# Patient Record
Sex: Female | Born: 1955 | Race: White | Hispanic: No | Marital: Single | Smoking: Former smoker
Health system: Southern US, Community
[De-identification: ages and names within clinical notes are randomized; demographics above are authoritative.]

## PROBLEM LIST (undated history)

## (undated) DIAGNOSIS — R202 Paresthesia of skin: Secondary | ICD-10-CM

## (undated) DIAGNOSIS — I10 Essential (primary) hypertension: Secondary | ICD-10-CM

## (undated) DIAGNOSIS — K219 Gastro-esophageal reflux disease without esophagitis: Secondary | ICD-10-CM

## (undated) DIAGNOSIS — F329 Major depressive disorder, single episode, unspecified: Secondary | ICD-10-CM

## (undated) DIAGNOSIS — E7800 Pure hypercholesterolemia, unspecified: Secondary | ICD-10-CM

## (undated) DIAGNOSIS — M1990 Unspecified osteoarthritis, unspecified site: Secondary | ICD-10-CM

## (undated) DIAGNOSIS — G43909 Migraine, unspecified, not intractable, without status migrainosus: Secondary | ICD-10-CM

## (undated) DIAGNOSIS — F419 Anxiety disorder, unspecified: Secondary | ICD-10-CM

## (undated) DIAGNOSIS — R200 Anesthesia of skin: Secondary | ICD-10-CM

## (undated) DIAGNOSIS — F32A Depression, unspecified: Secondary | ICD-10-CM

## (undated) DIAGNOSIS — G971 Other reaction to spinal and lumbar puncture: Secondary | ICD-10-CM

## (undated) DIAGNOSIS — M797 Fibromyalgia: Secondary | ICD-10-CM

## (undated) DIAGNOSIS — I639 Cerebral infarction, unspecified: Secondary | ICD-10-CM

## (undated) DIAGNOSIS — C50911 Malignant neoplasm of unspecified site of right female breast: Secondary | ICD-10-CM

## (undated) HISTORY — PX: BREAST RECONSTRUCTION: SHX9

## (undated) HISTORY — PX: JOINT REPLACEMENT: SHX530

## (undated) HISTORY — PX: BREAST LUMPECTOMY: SHX2

## (undated) HISTORY — PX: FOOT SURGERY: SHX648

---

## 1999-09-10 DIAGNOSIS — C50911 Malignant neoplasm of unspecified site of right female breast: Secondary | ICD-10-CM

## 1999-09-10 HISTORY — PX: DILATION AND CURETTAGE OF UTERUS: SHX78

## 1999-09-10 HISTORY — PX: BREAST LUMPECTOMY: SHX2

## 1999-09-10 HISTORY — DX: Malignant neoplasm of unspecified site of right female breast: C50.911

## 1999-09-10 HISTORY — PX: CHOLECYSTECTOMY: SHX55

## 2006-01-02 ENCOUNTER — Other Ambulatory Visit: Admission: RE | Admit: 2006-01-02 | Discharge: 2006-01-02 | Payer: Self-pay | Admitting: *Deleted

## 2006-01-10 ENCOUNTER — Ambulatory Visit: Payer: Self-pay | Admitting: Hematology & Oncology

## 2006-02-06 ENCOUNTER — Encounter: Admission: RE | Admit: 2006-02-06 | Discharge: 2006-02-06 | Payer: Self-pay | Admitting: Family Medicine

## 2006-05-07 ENCOUNTER — Encounter: Admission: RE | Admit: 2006-05-07 | Discharge: 2006-05-07 | Payer: Self-pay | Admitting: Family Medicine

## 2006-09-03 ENCOUNTER — Encounter: Admission: RE | Admit: 2006-09-03 | Discharge: 2006-09-03 | Payer: Self-pay | Admitting: Family Medicine

## 2007-01-05 ENCOUNTER — Encounter (INDEPENDENT_AMBULATORY_CARE_PROVIDER_SITE_OTHER): Payer: Self-pay | Admitting: Specialist

## 2007-01-05 ENCOUNTER — Ambulatory Visit (HOSPITAL_BASED_OUTPATIENT_CLINIC_OR_DEPARTMENT_OTHER): Admission: RE | Admit: 2007-01-05 | Discharge: 2007-01-05 | Payer: Self-pay | Admitting: Specialist

## 2007-04-16 ENCOUNTER — Encounter (HOSPITAL_BASED_OUTPATIENT_CLINIC_OR_DEPARTMENT_OTHER): Admission: RE | Admit: 2007-04-16 | Discharge: 2007-06-09 | Payer: Self-pay | Admitting: Internal Medicine

## 2007-04-27 ENCOUNTER — Ambulatory Visit (HOSPITAL_COMMUNITY): Admission: RE | Admit: 2007-04-27 | Discharge: 2007-04-27 | Payer: Self-pay | Admitting: Internal Medicine

## 2007-06-10 ENCOUNTER — Encounter (HOSPITAL_BASED_OUTPATIENT_CLINIC_OR_DEPARTMENT_OTHER): Admission: RE | Admit: 2007-06-10 | Discharge: 2007-07-08 | Payer: Self-pay | Admitting: Surgery

## 2007-07-09 ENCOUNTER — Encounter (HOSPITAL_BASED_OUTPATIENT_CLINIC_OR_DEPARTMENT_OTHER): Admission: RE | Admit: 2007-07-09 | Discharge: 2007-08-02 | Payer: Self-pay | Admitting: Surgery

## 2007-08-03 ENCOUNTER — Encounter (HOSPITAL_BASED_OUTPATIENT_CLINIC_OR_DEPARTMENT_OTHER): Admission: RE | Admit: 2007-08-03 | Discharge: 2007-09-09 | Payer: Self-pay | Admitting: Surgery

## 2007-09-14 ENCOUNTER — Encounter (HOSPITAL_BASED_OUTPATIENT_CLINIC_OR_DEPARTMENT_OTHER): Admission: RE | Admit: 2007-09-14 | Discharge: 2007-10-27 | Payer: Self-pay | Admitting: Surgery

## 2007-09-17 ENCOUNTER — Encounter: Admission: RE | Admit: 2007-09-17 | Discharge: 2007-09-17 | Payer: Self-pay | Admitting: Family Medicine

## 2007-10-13 ENCOUNTER — Ambulatory Visit: Payer: Self-pay | Admitting: Hematology & Oncology

## 2007-10-13 ENCOUNTER — Other Ambulatory Visit: Admission: RE | Admit: 2007-10-13 | Discharge: 2007-10-13 | Payer: Self-pay | Admitting: Obstetrics and Gynecology

## 2007-10-26 LAB — CHCC SMEAR

## 2007-10-26 LAB — CBC WITH DIFFERENTIAL/PLATELET
Basophils Absolute: 0 10*3/uL (ref 0.0–0.1)
NEUT%: 75.4 % (ref 39.6–76.8)
RBC: 4.59 10*6/uL (ref 3.70–5.32)
RDW: 12.6 % (ref 11.3–14.5)
WBC: 14.4 10*3/uL — ABNORMAL HIGH (ref 3.9–10.0)
lymph#: 2.5 10*3/uL (ref 0.9–3.3)

## 2011-01-22 NOTE — Assessment & Plan Note (Signed)
[facility]   NAME:  Julie Turner, Julie Turner                 ACCOUNT NO.:  [id number]   MEDICAL RECORD NO.:  [id number]      DATE OF BIRTH:  10-02-1955   PHYSICIAN:  Theresia Majors. Tanda Rockers, M.D. 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  In the interim she has  used moist-moist dressing and daily antiseptic soap washing by way of  shower.  There has been no excessive drainage, malodor, or fever.   OBJECTIVE:  Blood pressure is 136/95, respirations 18, pulse rate 87,  temperature is 98.3.  Inspection of the wound shows a persistent open ulcer on the lower inner  quadrant of the breast.  There is no malodor.  There is no frank  necrosis.  The wound depth is essentially unchanged.   ASSESSMENT:  Persistent wound related to delayed radiation injury of the  right breast.   PLAN:  We will continue moist-moist dressing, antiseptic soap washing,  and proceed with hyperbaric oxygen as soon as possible.  We have  explained this approach to the patient in terms that she seems to  understand.  She indicates that she will call her insurance company to  expedite her precertification.      Harold A. Tanda Rockers, M.D.  Electronically Signed     HAN/MEDQ  D:  05/04/2007  T:  05/05/2007  Job:  [id number]

## 2011-01-22 NOTE — Assessment & Plan Note (Signed)
[facility]   NAME:  Julie Turner, Julie Turner                 ACCOUNT NO.:  [id number]   MEDICAL RECORD NO.:  [id number]      DATE OF BIRTH:  [birth year]   PHYSICIAN:  Theresia Majors. Tanda Rockers, M.D. VISIT DATE:  10/26/2007                                   OFFICE VISIT   SUBJECTIVE:  Julie Turner is a 55 year old lady whom we are seeing for a  delayed radiation injury following external radiation to the right  breast.  She has completed 2 separate courses of hyperbaric oxygen.  She  returns for follow-up.  She failed to keep her final appointments with  HBO because of an upper respiratory illness.  There has been no  recurrent fever.  She reports that she has also added some ear drops to  her ears to prevent wax.  There has been no interim fever.  She has been  concurrently seen by OB/GYN and oncology with no evidence of recurrence  of her breast tumor.   OBJECTIVE:  Blood pressure is 140/87, respirations 18, pulse rate 105,  temperature is 98.2.  Inspection of the right breast shows that the wound is completely  resolved.  There is a dimpling consistent with the ulceration but the  wound is completely re-epithelialized with no evidence of active  infection.  Inspection of both external auditory canals shows that the  tympanostomy tubes are in place and the eardrums appear to be normal.   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   PLAN:  We are discharging the patient from active follow-up in the Wound  Center.  We have encouraged her to keep her appointments with the  otolaryngologist for the final disposition regarding her tympanotomies.  We have given an opportunity to ask questions.  She expresses gratitude  for having been seen in the clinic and indicates that she will be  compliant.      Harold A. Tanda Rockers, M.D.  Electronically Signed     HAN/MEDQ  D:  10/26/2007  T:  10/27/2007  Job:  [id number]   cc:   Earvin Hansen L. Shon Hough, M.D.

## 2011-01-22 NOTE — Assessment & Plan Note (Signed)
[facility]   NAME:  Julie Turner, Julie Turner                 ACCOUNT NO.:  [id number]   MEDICAL RECORD NO.:  [id number]      DATE OF BIRTH:  November 25, 1955   PHYSICIAN:  Theresia Majors. Tanda Rockers, M.D. VISIT DATE:  07/07/2007                                   OFFICE VISIT   SUBJECTIVE:  Ms. Doland is a 55 year old lady with a delayed radiation  injury to her right breast.  She is undergoing hyperbaric oxygen  treatment.  There has been no interim drainage, excessive pain or  malodor.  There have been no symptoms referable to barotrauma, oxygen  toxicity, or claustrophobia.  The patient has completed #37 of a total  of 60 ordered dives.   OBJECTIVE:  Blood pressure is 137/83, respirations 18, pulse rate 66,  temperature is 97.9. Inspection of the wound shows that there has been  continued contraction.  There is no evidence of excessive drainage. A  wound curette was used to debride nonviable adipose tissue and skin.  Hemorrhage was controlled with direct pressure.   ASSESSMENT:  Clinical response to hyperbaric oxygen treatment of  a  delayed cutaneous injury secondary to radiation.   PLAN:  We will continue radiation therapy and evaluate her after five  additional dives.      Harold A. Tanda Rockers, M.D.  Electronically Signed     HAN/MEDQ  D:  07/07/2007  T:  07/08/2007  Job:  [id number]

## 2011-01-22 NOTE — Assessment & Plan Note (Signed)
[facility]   NAME:  CASIDEE, JANN                 ACCOUNT NO.:  [id number]   MEDICAL RECORD NO.:  [id number]      DATE OF BIRTH:  1956/02/05   PHYSICIAN:  Theresia Majors. Tanda Rockers, M.D. VISIT DATE:  05/26/2007                                   OFFICE VISIT   SUBJECTIVE:  Ms. Killam is a 55 year old lady who has undergoing  hyperbaric oxygen treatment for a delayed radiation injury to the right  breast.  She has completed #8 of a total ordered 30 treatments.  She  reports that there has been decreased drainage and decreased pain.   OBJECTIVE:  Blood pressure is 138/76, pulse rate of 70, respirations 16,  temperature 98.5.  Inspection of the wound shows that there has been development of healthy-  appearing granulation.  There is no necrosis whatsoever.  There is a  serous drainage, which is scant in volume.  There is no evidence of  inflammation or malodor.   ASSESSMENT:  Clinical response to hyperbaric oxygen.   PLAN:  We will continue her HBO therapy and a loose saline moist gauze  packing.  We will reevaluate her wound in 1 week.      Harold A. Tanda Rockers, M.D.  Electronically Signed     HAN/MEDQ  D:  05/26/2007  T:  05/27/2007  Job:  [id number]

## 2011-01-22 NOTE — Assessment & Plan Note (Signed)
[facility]   NAME:  Julie Turner, Julie Turner                 ACCOUNT NO.:  [id number]   MEDICAL RECORD NO.:  [id number]      DATE OF BIRTH:  27-Mar-1956   PHYSICIAN:  Theresia Majors. Tanda Rockers, M.D.      VISIT DATE:                                   OFFICE VISIT   SUBJECTIVE:  Julie Turner is a 55 year old lady who is undergoing  hyperbaric oxygen therapy for management of a delayed radiation injury  to her right breast.  She has completed 22 of 30 HBO treatments.  There  is been no excessive drainage, malodor, pain or fever.   OBJECTIVE:  Inspection of the wound shows that the area of ulceration  has contracted.  There is significant decrease in depth of the previous  cavity.  There appears to be increased vascularity with an increase in  the area of granulation throughout the wound.  There is no excessive  necrosis; a debridement is not needed.  On probe with a Q-tip, there are  no loculations.   ASSESSMENT:  Clinical response to hyperbaric oxygen treatment.   PLAN:  We will continue HBO treatment.  We have instructed the patient  to shower daily with antiseptic soap and blot the wound clean and wear a  dry dressing.  We are discontinuing the use of the hydrogel-impregnated  gauze.  The patient seems to understand these instructions and indicates  that she will be compliant.  We will reevaluate her in 5 days.      Harold A. Tanda Rockers, M.D.  Electronically Signed     HAN/MEDQ  D:  06/16/2007  T:  06/17/2007  Job:  [id number]

## 2011-01-22 NOTE — Assessment & Plan Note (Signed)
[facility]   NAME:  KAMAIYAH, USELTON                 ACCOUNT NO.:  [id number]   MEDICAL RECORD NO.:  [id number]      DATE OF BIRTH:  1956-08-03   PHYSICIAN:  Theresia Majors. Tanda Rockers, M.D. VISIT DATE:  08/10/2007                                   OFFICE VISIT   SUBJECTIVE:  Ms. Mena a 55 year old female who is undergoing hyperbaric  oxygen treatment for an ulceration to a right breast post radiation  treatment.  There has been mild exudate but no extensive malodor,  erythema or pain.  She denies symptoms related to barotrauma, oxygen  toxicity, or confinement anxiety.  She is complaining of the inability  to read in the distance.   OBJECTIVE:  VITAL SIGNS:  Stable.  She is afebrile.  HEENT:  Exam is clear.  NECK:  Supple.  Trachea is midline.  CHEST:  Inspection of the right breast shows that there is clear  granulation with a contracting wound.  There is no evidence of  infection.   ASSESSMENT:  Satisfactory response to hyperbaric oxygen for delayed  radiation injury to the right breast.   PLAN:  The patient is to have mammography performed within the next 2  weeks.  We will be interested in those results particularly as they  relate to the contralateral breast.  In addition, she has elected not to  proceed with hyperbaric oxygen treatment today due to her tardiness upon  arrival, but we will resume HBO therapy tomorrow.  We will reapply the  wound vac and resume HBO as ordered.  We have given the patient  opportunity to ask questions.  She seems to understand.  We have  encouraged her to be compliant.  She indicates that she we will be so.      Harold A. Tanda Rockers, M.D.  Electronically Signed     HAN/MEDQ  D:  08/10/2007  T:  08/10/2007  Job:  [id number]

## 2011-01-22 NOTE — Assessment & Plan Note (Signed)
[facility]   NAME:  Julie Turner, BLEAU                 ACCOUNT NO.:  [id number]   MEDICAL RECORD NO.:  [id number]      DATE OF BIRTH:  1956-07-04   PHYSICIAN:  Jonelle Sports. Sevier, M.D.  VISIT DATE:  07/22/2007                                   OFFICE VISIT   WOUND CENTER PROGRESS NOTE   HISTORY:  This 55 year old white female is being followed for a late  radiation soft tissue injury to the right breast following irradiation  therapy for breast cancer.  She has been managed with HBO therapy and  recently wound vac therapy was added as well.   She is seen today for a weekly wound evaluation in conjunction with the  HBO protocol.   She reports concern that there has been some low grade infection in this  wound from time to time and in fact she is currently on Cipro 250 mg  b.i.d. for the treatment of an Enterobacter cloacae infection  superficial in nature which was discovered recently.  She is very  concerned that as the wound heals that all the infection be out of their  so that she does not risk a recurrence.   PHYSICAL EXAMINATION:  Blood pressure 142/87, pulse 72, respirations 16,  temperature 98.2.  The wound at the three to four o'clock position just  the lateral to the areola on the right breast now measures 1.5 x 1.3 x  1.7 cm.  There is a suggestion of a tract at the depth of the wound at  the 12 to 1 o'clock position but this is less well delineated as a  separate factor than it was initially.  The previously seen fibrous  tissue in the wound base has pretty well disappeared and one can now see  beginning granulation.  There is no significant slough.  No malodor.  Notably the tissue in the wound itself is more pliable as is the tissue  of the adjacent breast.   IMPRESSION:  Radiation injury, soft tissue, of right breast improving on  HBO and wound vac therapy.   DISPOSITION:  The patient will return to her wound vac therapy on an  ongoing basis, this  together with the continuation of her hyperbaric  oxygen treatments.   She will continue the Cipro 250 mg p.o. b.i.d. for the present.   The next the wound evaluation will be one week hence, sooner if need be.           ______________________________  Jonelle Sports. Cheryll Cockayne, M.D.     RES/MEDQ  D:  07/22/2007  T:  07/23/2007  Job:  [id number]

## 2011-01-22 NOTE — Assessment & Plan Note (Signed)
[facility]   NAME:  CELENE, PIPPINS                 ACCOUNT NO.:  [id number]   MEDICAL RECORD NO.:  [id number]      DATE OF BIRTH:  August 20, 1956   PHYSICIAN:  Theresia Majors. Tanda Rockers, M.D.      VISIT DATE:                                   OFFICE VISIT   SUBJECTIVE:  Ms. Erb is a 55 year old female who has undergone a total  of 50 HBO treatments for a delayed radiation injury to the soft tissue  involving the right breast.  She has requested that she not proceed with  hyperbaric oxygen treatment today because she is having a bad day.  There has been no interim drainage.  She has taken only 3-4 Tylox  tablets over the last 2 weeks for pain.  There has been no excessive  malodor.  The wound VAC is initially discomforting when it is first  placed, but it becomes highly tolerable after being in place.   OBJECTIVE:  VITAL SIGNS:  Stable.  She is afebrile.  BREAST:  Inspection of the wound shows that there has been dramatic  decrease in area volume and depth.  There is healthy-appearing  granulation throughout the wound.  The undermining is markedly  decreased.  There is no drainage.  There is no malodor.  There is no  hyperemia.  On palpation, the breast is markedly less tender.   ASSESSMENT:  Clinical response to hyperbarics and wound VAC therapy.   PLAN:  1. We will continue her wound VAC as well as her hyperbaric oxygen.  2. We are suggesting that the patient speak with the site director      regarding her dissatisfaction with scheduling.  3. We will continue her HBO at 2.4 atmospheres 90 minutes 100% oxygen      with two 5-minute air breaks      Harold A. Tanda Rockers, M.D.  Electronically Signed     HAN/MEDQ  D:  07/28/2007  T:  07/28/2007  Job:  [id number]

## 2011-01-22 NOTE — Assessment & Plan Note (Signed)
[facility]   NAME:  Julie Turner, Julie Turner                 ACCOUNT NO.:  [id number]   MEDICAL RECORD NO.:  [id number]      DATE OF BIRTH:  04-Mar-1956   PHYSICIAN:  Theresia Majors. Tanda Rockers, M.D. VISIT DATE:  09/14/2007                                   OFFICE VISIT   SUBJECTIVE:  Julie Turner is a 55 year old lady who we are treating for a  soft tissue injury to the left breast following radiation therapy.  She  has currently completed 60 HBO treatments and has a residual wound which  has shown dramatic improvement, but it is nevertheless persistent.  We  have recommended that she continue her wound-vac therapy in the interim  as well as be precertified for hyperbaric oxygen, a total of 30  additional treatments.  She has also been instructed to proceed with her  annual mammogram.   In the interim, she has discontinued the wound-vac and she has not  proceeded with the serial mammogram.  There has been no interim  drainage, malodor, pain or fever.   OBJECTIVE:  VITAL SIGNS:  Blood pressure is 131/73, respirations 16,  pulse rate 94, temperature 97.1.  Inspection of the right breast shows that there has been continued  decrease in the area of the wound.  There is no necrosis.  There is no  drainage or malodor.  There is no hyperemia or cellulitis.   ASSESSMENT:  Continued improvement of the wound.   PLAN:  We are discontinuing her wound-vac.  We will resume her  hyperbaric oxygen treatment at 90-minute sessions, 100% oxygen at 2.4  atmospheres with two 5-minute air breaks.  The patient will continue to  perform daily antiseptic soap washes with moist-moist dressings to be  applied in the Wound Center.  We will reevaluate her in 1 week.      Harold A. Tanda Rockers, M.D.  Electronically Signed     HAN/MEDQ  D:  09/14/2007  T:  09/14/2007  Job:  [id number]

## 2011-01-22 NOTE — Consult Note (Signed)
NAME:  Julie Turner, Julie Turner                 ACCOUNT NO.:  [id number]   MEDICAL RECORD NO.:  [id number]          PATIENT TYPE:  REC   LOCATION:  FOOT                         FACILITY:  [facility]   PHYSICIAN:  Jonelle Sports. Sevier, M.D. DATE OF BIRTH:  [birth year]   DATE OF CONSULTATION:  06/02/2007  DATE OF DISCHARGE:                                 CONSULTATION   HISTORY:  This 55 year old white female is undergoing hyperbaric oxygen  therapy for a nonhealing abscess site in the right breast which occurred  several years after completion of radiation therapy, but the healing of  which has been greatly impaired by the radiation damage to that tissue.  She has to this point completed 13 hyperbaric oxygen treatments and says  that she can see a significant improvement in the overall appearance of  the wound.  She reports very little drainage, does notice some odor, and  is meticulous in her care of the lesion, irrigating it at least twice  daily with warm soapy water followed by saline rinses and then the  placement of the hydrogel-soaked gauze.   EXAMINATION:  Blood pressure today after hyperbaric therapy was 121/75,  pulse 88 and regular, respirations 16, temperature 98.1.   The wound itself today is uninflamed, perhaps slightly dry, and measures  2.4 x 2.0 on the surface and is approximately 2 cm deep with a sinus  tract of about 2.5 cm at the 12 o'clock position.  There is no odor, no  substantial drainage.   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   DISPOSITION:  The wound requires no debridement today.  It is redressed  with cotton gauze saturated with hydrogel and covered with an absorptive  SofSorb pad.   She will continue the same treatments at home and will continue on  hyperbaric oxygen therapy with her next such treatment tomorrow.           ______________________________  Jonelle Sports Cheryll Cockayne, M.D.     RES/MEDQ  D:  06/02/2007  T:  06/03/2007  Job:  [phone]

## 2011-01-22 NOTE — Assessment & Plan Note (Signed)
[facility]   NAME:  Julie Turner, Julie Turner                 ACCOUNT NO.:  [id number]   MEDICAL RECORD NO.:  [id number]      DATE OF BIRTH:  Jun 07, 1956   PHYSICIAN:  Theresia Majors. Tanda Rockers, M.D. VISIT DATE:  04/27/2007                                   OFFICE VISIT   SUBJECTIVE:  Julie Turner is a 55 year old female who has a delayed injury  associated with radiation therapy post lumpectomy.  The patient has been  previously evaluated by Dr. Leanord Hawking and has been referred for HBO  therapy.  In the interim, she continues to have to bathe the wound  daily.  She has been treated interimly also with a silver matrix  dressing.  There has been no excessive pain, malodor or drainage.   OBJECTIVE:  VITAL SIGNS:  Stable.  She is afebrile.  Inspection of the  breast shows that there are postsurgical changes in the left breast. In  the right breast at 4 o'clock is an oval wound which has no excessive  drainage or malodor.  There are remnants of what appears to be  granulation, which is poorly vascularized.  The wound was probed with a  Q-tip and extends down to the pectoral fascia.  There is no evidence of  abscess, cellulitis or axillary cervical adenopathy.   CLINICAL IMPRESSION:  Delayed radiation injury (myocutaneous)   PLAN:  We will recommend that we proceed with hyperbaric oxygen at 100%  oxygen at 2 atmospheres at 90-minute sessions and we are recommending  initially 30 treatments.   We have explained the indications of hyperbaric oxygen for complications  of radiation therapy  to the patient in terms that she seems to  understand.  We have specifically addressed the possibility of  barotrauma, claustrophobia and oxygen toxicity.  The patient seems to  understand.  She gives her verbal informed consent to proceed with  treatment protocol as outlined.  We will begin her HBO therapy as soon  as a dive slot becomes available on the schedule.  In the interim, we  will continue a moist  moist dressing to the wound.      Harold A. Tanda Rockers, M.D.  Electronically Signed     HAN/MEDQ  D:  04/27/2007  T:  04/27/2007  Job:  [id number]

## 2011-01-22 NOTE — Assessment & Plan Note (Signed)
[facility]   NAME:  Julie Turner, Julie Turner                 ACCOUNT NO.:  [id number]   MEDICAL RECORD NO.:  [id number]      DATE OF BIRTH:  [birth year]   PHYSICIAN:  Theresia Majors. Tanda Rockers, M.D.      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  In the interim she has  complained of some increased serous drainage.  There has been no  malodor.  There  has been no increased redness.  There has been no  interim trauma.  She has completed 27 out of 30 ordered dives.   OBJECTIVE:  Blood pressure is 111/69, respirations 16, pulse rate 86,  temperature 97.7.  Inspection of the right breast shows that the wound  continues to contract.  There is healthy-appearing granulation with  areas of fibrosis.  There is no necrosis.  There is no need for  debridement.  There is no increased induration.   ASSESSMENT:  Clinical response to HBO.   PLAN:  We will continue HBO treatments.  Reevaluate the wound after five  additional treatments.      Harold A. Tanda Rockers, M.D.  Electronically Signed     HAN/MEDQ  D:  06/23/2007  T:  06/24/2007  Job:  [id number]

## 2011-01-22 NOTE — Assessment & Plan Note (Signed)
[facility]   NAME:  LA, SHEHAN                 ACCOUNT NO.:  [id number]   MEDICAL RECORD NO.:  [id number]      DATE OF BIRTH:  1956/06/13   PHYSICIAN:  Theresia Majors. Tanda Rockers, M.D. VISIT DATE:  08/18/2007                                   OFFICE VISIT   SUBJECTIVE:  Ms. 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  We have treated her with  the Wound VAC, hyperbaric oxygen treatment and serial debridements.  She  returns for follow-up.  There is been no interim excessive drainage,  pain or malodor.   OBJECTIVE:  Blood pressure is 135/75, respirations are 16, pulse rate is  86, temperature is 99.  Inspection of the right breast shows continued contraction.  There is  healthy granulation throughout the wound.  The wound was sounded with a  Q-tip and the measurements were recorded.  Please refer to the data  entries.   ASSESSMENT:  Clinical improvement, continued improvement of the  radiation-injured breast as a result of hyperbaric oxygen therapy.   PLAN:  We are recommending that we continue her HBO treatment along with  the Wound VAC.  We have also strongly encouraged the patient to proceed  with her yearly mammogram with particular attention not only to the  breast that we are treating but also her contralateral breast.  We will  continue her HBO.   There have been no complaints of claustrophobia, oxygen toxicity or  barotrauma.  We have given the patient an opportunity to ask questions.  She seems to understand the treatment that has been proposed and  indicates that she will be compliant.      Harold A. Tanda Rockers, M.D.  Electronically Signed     HAN/MEDQ  D:  08/18/2007  T:  08/18/2007  Job:  [id number]   cc:   Sigmund Hazel, M.D.  Yaakov Guthrie. Shon Hough, M.D.

## 2011-01-22 NOTE — Assessment & Plan Note (Signed)
[facility]   NAME:  Julie Turner, Julie Turner                 ACCOUNT NO.:  [id number]   MEDICAL RECORD NO.:  [id number]      DATE OF BIRTH:  1956/08/22   PHYSICIAN:  Theresia Majors. Tanda Rockers, M.D. 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  In the interim, she has complained of some persistent pain, but  certainly much less than before the initiation of HBO.  There has been  no excessive drainage or malodor.   OBJECTIVE:  Blood pressure is 121/74, pulse rate of 70, respirations 16,  temperature is 98.0.  These vital signs were taken at the end of her  42nd dive.  Inspection of the wound shows that there is contraction of  the cavity.  There is necrosis in the depths of the wound, which was  debrided with a curette.  Hemorrhage was controlled with direct  pressure; the wound was copiously irrigated.  There is no evidence of  abscess formation or extensive infection.   ASSESSMENT:  Continued response to hyperbaric oxygen.   PLAN:  We will add a wound VAC to this wound in an attempt to close this  cavity.  We will continue hyperbaric oxygen treatment. In addition, we  will restart her Cipro at 250 mg p.o. b.i.d.; her last culture grew a  Pseudomonas that was sensitive. We have recultured her today.  We will  reevaluate her in 5 days.  Hopefully we will start her wound VAC within  the next 24 hours.      Harold A. Tanda Rockers, M.D.  Electronically Signed     HAN/MEDQ  D:  07/14/2007  T:  07/14/2007  Job:  [id number]

## 2011-01-22 NOTE — Assessment & Plan Note (Signed)
[facility]   NAME:  Julie Turner, Julie Turner                 ACCOUNT NO.:  [id number]   MEDICAL RECORD NO.:  [id number]      DATE OF BIRTH:  [birth year]   PHYSICIAN:  Theresia Majors. Tanda Rockers, M.D.      VISIT DATE:                                   OFFICE VISIT   Julie Turner is a 55 year old lady who we are treating for delayed  radiation injury to the soft tissue of the right breast.  In the interim  she has been instructed to take antiseptic soap baths twice a day and  maintain a moist dressing she continues h.b.o.  There has been no fever,  no excessive drainage or mal odor.   OBJECTIVE:  VITAL SIGNS:  Blood pressure:  124/76.  Respirations:  16.  Pulse rate: 87.  Temperature:  98.2  Inspection of the right breast shows that the ulcer continues to  contract.  There is 100 granulation with some moderate desquamation.  There is no evidence of infection.   ASSESSMENT:  Satisfactory response to hyperbaric oxygen.   PLAN:  We will continue hyperbaric oxygen and reevaluate her in one  week.      Harold A. Tanda Rockers, M.D.  Electronically Signed     HAN/MEDQ  D:  09/21/2007  T:  09/21/2007  Job:  [id number]

## 2011-01-22 NOTE — Assessment & Plan Note (Signed)
[facility]   NAME:  Julie Turner, Julie Turner NO.:  [id number]   MEDICAL RECORD NO.:  [id number]      DATE OF BIRTH:  Nov 01, 1955   PHYSICIAN:  Maxwell Caul, M.D. VISIT DATE:  04/20/2007                                   OFFICE VISIT   LOCATION:  [facility].   Julie Turner is a 55 year old woman who arrives for review of a wound on  her right breast.  The patient is her own informant.  She arrived  accompanied by her mother.   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  She underwent two  lumpectomies and axillary lymph node dissection.  She had two courses of  radiation and I believe chemotherapy at that time.  There were no  immediate wound problems.  In 2005 she went on to have left breast  reduction without incident to match her right breast.  Her most recent  problem seems to have started in January of this year when she developed  a hot, red erythematous area on the anterior aspect of her right breast.  The entire area then burst open.  She went to see Dr. Shon Hough at  that time.  She tells me that she had an infection.  She was treated  with antibiotics and for the next three months various topical dressings  to what was now an open wound.  Finally in April 2008 she went to the OR  for an attempt at surgical closure of this wound.  The operative report  on E-chart is reviewed.  The patient apparently had methylene blue  applied to what was a fistulous opening.  The patient tells me that  within two days the wound had dehisced.  Since then she has been  basically seen in Dr. Charlesetta Garibaldi office every 1-2 weeks.  Of particular  note the pathology done at the time of the operative report was negative  for malignancy.   PAST MEDICAL HISTORY:  Two C-sections, lower abdominal abscess dealt  with surgically two years ago.  At that point she had some form  of  allergic reaction to SilvaSorb; however, subsequently __________  was  tolerated.  She is not a diabetic; however, she does smoke.  She also  has fibromyalgia.   MEDICATIONS:  1. Triamterene/hydrochlorothiazide 25 daily.  2. Cymbalta 60 daily.  3. Fish oil daily.  4. Multivitamin daily.   PHYSICAL EXAMINATION:  Exam is limited to the right breast and  surrounding area.  At roughly four o'clock in the right breast is the  original open area.  This measures 1.8 x 2.8 x 2.8.  There is a sinus at  roughly one o'clock in this wound that probes quite deeply.  There was  some purulent-looking drainage coming out of the sinus.  The wound bed  has some purulent looking material; otherwise, appears to be healthy.  The breast itself has firm areas over the surrounding breast tissue.  However, there are no obvious lumps, and I think all of this is probably  post radiation damage.  She had no axillary lymphadenopathy; the scar  from her previous node  dissection is noted.  There was no other  surrounding adenopathy.  The breast itself did not appear to be  obviously infected.   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 She has  been referred here for consideration of hyperbaric oxygen, and I agree  that that would probably be helpful and is probably the next step in her  treatment.  The patient expresses a strong wish to proceed with this.  I  will ask for Dr. Charlesetta Garibaldi notes specific to cultures that were done  on this area.  I have done a culture today as well of the deep sinus  tract at 1 o'clock in this wound.  We have packed this area with Aquacel  AG  which the patient can leave in until she is next seen.  We will  proceed with hyperbaric oxygen workup including EKG, chest x-ray, chem-  24, and CBC.  I see no contraindications to this.   We will see the patient back in one weeks' time and  follow-up.           ______________________________  Maxwell Caul, M.D.     MGR/MEDQ  D:  04/20/2007  T:  04/21/2007  Job:  [id number]   cc:   Earvin Hansen L. Shon Hough, M.D.  Sigmund Hazel, M.D.

## 2011-01-22 NOTE — Consult Note (Signed)
NAME:  Julie Turner, Julie Turner                 ACCOUNT NO.:  [id number]   MEDICAL RECORD NO.:  [id number]          PATIENT TYPE:  REC   LOCATION:  FOOT                         FACILITY:  [facility]   PHYSICIAN:  Julie Sports. Sevier, M.D. DATE OF BIRTH:  Apr 16, 1956   DATE OF CONSULTATION:  06/30/2007  DATE OF DISCHARGE:                                 CONSULTATION   HISTORY:  This 55 year old white female with history of breast cancer is  being followed for a radiation-induced wound of the right breast.  She  is currently on hyperbaric therapy and has completed some 30 of those  treatments.  She is seen today for a weekly wound evaluation.  She  apparently was cultured here a week ago because of some increased  exudate in the wound and that was found to show Enterobacter cloacae and  Pseudomonas aeruginosa, widely sensitive, and she was accordingly begun  on Cipro which she started 4 days ago.  She reports no increase in  drainage, no increase in pain, no malodor, and no other change.  She  treats a wound with occasional application of hydrogel and a dry  dressing.   PHYSICAL EXAMINATION:  VITAL SIGNS:  For vital signs today, please see  her HBO record.  Those were reviewed at that time and were satisfactory.  WOUND:  Her wound on the right breast, which is located at approximately  4 o'clock in relation to the nipple, now measures 2.1 x 1.7 cm x 1.5 cm  in depth, reaching a depth of about 1.7 cm at the apex of the wound in  the 11 to 1 o'clock position.  The wound is  aligned with tissue that is  not particularly granular and appears rather atrophic and shiny.  At  this point on the base of the wound is noted again a plaque of adherent  yellow fibrous tissue which does not appear to me to be an infectious  exudate as much as it is just a fibrous band.   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   DISPOSITION:  1. No debridement is done today.  It seems to me that  probably  at      some point, hopefully when the surrounding tissues begin to look a      little more vascular and a little more healthy,  she will need      debridement of this fibrous band.   Meanwhile, she will continue periodic applications of wound gel, with [place]  having provide such an application today and continue to dress it with a  dry dressing.  She will complete her prescribed course of hyperbaric  oxygen therapy.   The patient seems a little despondent today with respect to the slowness  of her progress but once she ventilates, seems much better and obviously  seems to understand that aspect of the situation.   Again, she will continue hyperbaric oxygen therapy with her next  treatment 1 day following this evaluation and she will have another  wound evaluation and 1 week.  ______________________________  Julie Turner, M.D.     RES/MEDQ  D:  06/30/2007  T:  07/01/2007  Job:  [id number]

## 2011-01-25 NOTE — Op Note (Signed)
NAME:  Julie Turner, Julie Turner                 ACCOUNT NO.:  [id number]   MEDICAL RECORD NO.:  [id number]          PATIENT TYPE:  AMB   LOCATION:  DSC                          FACILITY:  [facility]   PHYSICIAN:  Earvin Hansen L. Truesdale, M.D.DATE OF BIRTH:  [birth year]   DATE OF PROCEDURE:  01/05/2007  DATE OF DISCHARGE:                               OPERATIVE REPORT   This 55 year old lady is status post right lumpectomy for breast cancer  with radiation and chemotherapy.  The patient, after that surgery, had a  wound breakdown causing an increased drainage that was excised and  closed.  This was approximately 2 years ago in [place].  She now has  over the last few weeks, an open wound on the right breast at  approximately 4 o'clock position on the areola.  We have done extensive  conservative treatment including debridement using enzymatic debriders,  etc., with no avail in terms of the area healing.  The patient is now  being prepared for exploration of the area, plastic closure.   ANESTHESIA:  General.   The patient underwent general anesthesia, was intubated orally.  Prep  was done to the breast areas with Hibiclens soap and solution and walled  off with sterile towels and drapes so as to make a sterile field.  Doppler which had a fistulous clinical picture and was injected loosely  with methylene blue drops.  Methylene blue was dropped down into this  area and allowed to sit for 5 minutes.  After this, I used a marking  pen.  I diagrammed an elliptical excision, including some of the peri-  areolar area to camouflage some of the scar.  I dissected around this  with #15 blade, then used a Bovie.  We dissected around the area at  approximately 1-cm margin to all her breast tissue.  Then we went under  the area and was able to remove the whole area in toto without showing  any methylene blue perforation.  Hemostasis was maintained with a Bovie  and anticoagulation.  Next, what I did was I grabbed and  dissected some  of the deep flaps of breast tissue at the lateral, medial, superior, and  inferior margins, freeing them up approximately 3 cm and then sliding  everything together in the midline area of deep subcutaneous sutures of  2-0 Monocryl x3 layers, then a subdermal suture of 3-0 Monocryl, and  then I ran a subcuticular stitch of 3-0 Monocryl to disallow  saucerization or inversion of the wound.  This came together very nicely  without any problems.  The specimen was then examined.  I sliced it in  half.  At the base of the tract, it looked as if there was a possible  suture reaction, but we have sent it pathology for the official reading.  The wounds were cleansed.  Steri-Strips and soft dressing was applied to  all areas.  She withstood the procedure very well, was taken to recovery  in excellent condition.      Yaakov Guthrie. Shon Hough, M.D.  Electronically Signed     GLT/MEDQ  D:  01/05/2007  T:  01/05/2007  Job:  [id number]

## 2011-06-21 LAB — COMPREHENSIVE METABOLIC PANEL
ALT: 21
Albumin: 4.3
Alkaline Phosphatase: 65
BUN: 14
CO2: 24
Calcium: 9.9
Chloride: 105
Creatinine, Ser: 0.88
GFR calc Af Amer: >60
Total Bilirubin: 0.7
Total Protein: 7.5

## 2011-06-21 LAB — DIFFERENTIAL
Basophils Relative: 0
Eosinophils Absolute: 0.1
Eosinophils Relative: 1
Lymphocytes Relative: 24
Monocytes Relative: 6
Neutro Abs: 9.7 — ABNORMAL HIGH

## 2011-06-21 LAB — CBC
HCT: 44
Hemoglobin: 15.1 — ABNORMAL HIGH
MCV: 90.3
Platelets: 320
RDW: 12.9

## 2011-11-21 ENCOUNTER — Other Ambulatory Visit: Payer: Self-pay | Admitting: Family Medicine

## 2011-11-21 ENCOUNTER — Other Ambulatory Visit (HOSPITAL_COMMUNITY)
Admission: RE | Admit: 2011-11-21 | Discharge: 2011-11-21 | Disposition: A | Discharge status: Home / Self Care | Payer: Medicaid Other | Source: Ambulatory Visit | Attending: Family Medicine | Admitting: Family Medicine

## 2011-11-21 DIAGNOSIS — Z01419 Encounter for gynecological examination (general) (routine) without abnormal findings: Secondary | ICD-10-CM | POA: Insufficient documentation

## 2011-12-17 ENCOUNTER — Ambulatory Visit: Payer: Self-pay | Admitting: Physical Therapy

## 2013-02-18 ENCOUNTER — Telehealth: Payer: Self-pay | Admitting: Hematology & Oncology

## 2013-02-18 NOTE — Telephone Encounter (Signed)
Pt aware of 7-17 appointment, she didn't want fridays or before 1pm

## 2013-03-25 ENCOUNTER — Ambulatory Visit (HOSPITAL_BASED_OUTPATIENT_CLINIC_OR_DEPARTMENT_OTHER): Payer: Medicaid Other | Admitting: Hematology & Oncology

## 2013-03-25 ENCOUNTER — Ambulatory Visit: Payer: Medicaid Other

## 2013-03-25 ENCOUNTER — Other Ambulatory Visit (HOSPITAL_BASED_OUTPATIENT_CLINIC_OR_DEPARTMENT_OTHER): Payer: Medicaid Other | Admitting: Lab

## 2013-03-25 VITALS — BP 132/71 | HR 93 | Temp 98.0°F | Resp 16 | Ht 65.0 in | Wt 205.0 lb

## 2013-03-25 DIAGNOSIS — D72829 Elevated white blood cell count, unspecified: Secondary | ICD-10-CM

## 2013-03-25 LAB — CBC WITH DIFFERENTIAL (CANCER CENTER ONLY)
Eosinophils Absolute: 0.2 10*3/uL (ref 0.0–0.5)
LYMPH#: 3.9 10*3/uL — ABNORMAL HIGH (ref 0.9–3.3)
MCH: 31.4 pg (ref 26.0–34.0)
MONO#: 0.9 10*3/uL (ref 0.1–0.9)
Platelets: 258 10*3/uL (ref 145–400)
RBC: 4.75 10*6/uL (ref 3.70–5.32)
RDW: 12.7 % (ref 11.1–15.7)

## 2013-03-25 LAB — CHCC SATELLITE - SMEAR

## 2013-03-25 NOTE — Progress Notes (Signed)
This office note has been dictated.

## 2013-03-29 NOTE — Progress Notes (Signed)
CC:   Gretel Acre, MD, Fax [phone]  DIAGNOSIS:  Leukocytosis, likely reactive.  HISTORY OF PRESENT ILLNESS:  Ms. Kuan is a very nice 57 year old white female.  She has a lot of health issues.  She is on a lot of medications.  She has fibromyalgia.  She feels tired all the time.  She, again, is on a lot of medications.  She has hypertension.  She has anxiety.  She has some chronic pain issues with her back.  She does have a history of localized cancer of the right breast.  This was diagnosed back in 2000.  I am not sure exactly what the stage was, but some of this may have been stage of 1, as she says that she did not have cancer in the lymph nodes.  She was tried on tamoxifen after radiation therapy.  She did not tolerate this well.  She still feel that radiation is causing a lot of her issues.  She just feels tired.  She was diagnosed with fibromyalgia soon after the radiation was completed.  She has been seeing Dr. Janeice Robinson.  Of note, he has been doing some lab work on her.  Going back to March of 2013, her CBC showed a white count of 11.9, hemoglobin 15.2, hematocrit 44.5, platelet count is 261.  In December, her white cell count was 13.6.  Most recently, in June 2014, her CBC showed a white count of 15.5, hemoglobin 15.9, hematocrit 40, and platelet count was 303.  MCV was 93.  She had a normal white cell differential.  Because of the gradually increasing white cell count, Dr. Janeice Robinson felt that a hematologic evaluation was indicated.  Again, Ms. Scheib just does not feel well.  She says she feels tired. She hurts a lot.  There has been no weight loss or weight gain.  She is trying to lose weight.  She wants to exercise, but she says she just cannot because of the achiness and fatigue.  I told her that I thought that some of her problems may have been from all her medications that she is taking and medication interactions.  I told her that she probably needs to talk to  Dr. Janeice Robinson about seeing which ones could be held or discontinued.  She has had no change in bowel or bladder habits.  She has had no nausea or vomiting.  She still smokes a little bit.  She has had no rashes.  She denies any dysphagia or odynophagia.  Overall, her performance status is ECOG 1.  PAST MEDICAL HISTORY:  Detailed in the medical record.  She has hypertension, fibromyalgia, anxiety, chronic low back pain, hyperlipidemia, anxiety, and early stage breast cancer of the right breast.  ALLERGIES: 1. Arimidex. 2. Tamoxifen.  MEDICATIONS:  Ultram 50 mg p.o. q.6 hours, Cymbalta 120 mg p.o. q. day, Claritin 10 mg p.o. q. day, Xanax 1 mg p.o. t.i.d. p.r.n., hydrochlorothiazide 12.5 mg p.o. q. day, propranolol 80 mg p.o. q. day, Zanaflex 4 mg p.o. q.6 hours p.r.n., Flonase nasal spray 2 sprays q. day, Bentyl 20 mg p.o. t.i.d., Prilosec 20 mg p.o. q. day, aspirin 81 mg p.o. q. day, Ambien 10 mg p.o. q.h.s., vitamin D 50,000 units p.o. q. week, Savella 50 mg p.o. b.i.d.  SOCIAL HISTORY:  Remarkable for past tobacco use.  She probably smokes less than a half pack per day, but she used to smoke up to 2 packs per day.  There is no alcohol use.  She has no  obvious occupational exposures.  FAMILY HISTORY:  Remarkable for breast cancer in her mother who was 93 years old.  REVIEW OF SYSTEMS:  As stated in the history of present illness.  No additional findings were noted on a 12-system review.  PHYSICAL EXAMINATION:  General:  This is a fairly well-developed, well- nourished white female in no obvious distress.  Vital signs: Temperature of 98, pulse 93, respiratory rate 16, blood pressure 132/71. Weight is 205.  Head and neck:  Normocephalic, atraumatic skull.  There are no ocular or oral lesions.  There are no palpable cervical or supraclavicular lymph nodes.  Thyroid is not palpable.  There is no scleral icterus.  Lungs:  Clear bilaterally.  There are no rales, wheezes, or  rhonchi.  Cardiac:  Regular rate and rhythm, with a normal S1, S2.  There are no murmurs, rubs, or bruits.  Abdomen:  Soft with good bowel sounds.  There is no palpable abdominal mass.  There may be some slight tenderness in the left upper quadrant to palpation.  No palpable splenomegaly is noted.  There is no palpable hepatomegaly. Axillary:  No bilateral axillary adenopathy.  Back:  Some tenderness over the spine.  There are some spasms in the paravertebral muscles. Extremities:  Some trace edema in her ankles.  She did have a surgical scar on the dorsum of her left foot.  She has good range of motion of her joints.  There is no joint swelling, warmth or erythema. Neurological:  No focal neurological deficits.  Skin:  No rashes, ecchymosis, or petechia.  OTHER LABORATORY STUDIES:  White blood cells count 12.4, hemoglobin 14.9, hematocrit 44.7, platelet count 258.  MCV is 74.  White cell differential shows 60 segs, 31 lymphs, 7 monos, 2 eosinophils.  Peripheral smear shows a normochromic, normocytic population of red blood cells.  There are no nucleated red blood cells.  I see no teardrop cells.  There are no target cells.  I see no rouleaux formation.  White cells are minimally increased in number.  She has a few hypersegmented polys.  I see no immature myeloid cells.  There are no blasts.  There are no atypical lymphocytes.  Platelets are adequate in number and size.  IMPRESSION:  Ms. Newlon is a 57 year old white female with mild leukocytosis.  I actually did see Ms. Hixon back in 2009.  At that point in time, her white cell count was 14,000.  Her blood smear at that point in time was unremarkable.  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  She has multiple medical problems.  She is on multiple medications.  I just think that her bone marrow is somewhat stressed by all of her issues and by all of her medications.  Just to be on the safe side and to be thorough,  I am sending off her blood for JAK2 assay and also BCR-ABL assay.  I want to make sure that there is no underlying myeloproliferative process.  If any of these 2 tests come back positive, then she will need to have a bone marrow test done.  If these tests are negative, I really do not see that we need to get her back to the office, as her white cell count literally has not changed or has worsened in 5 years.  I told her to try some vitamin B12.  This may make her feel a little bit better.  I do not have any other suggestions for Ms. Nierman regarding trying to  get her to feel better.  Again, I think if she got off some of these medications that might help her, but this needs to be discussed with her internist.  I spent a good hour with Ms. Dosanjh.  I answered all her questions.  I tried to reassure her that I just did not think that we would find anything that was malignant or that would need to be treated or need to be investigated invasively.  I will call Ms. Centrella when I have the results back from the JAK2 assay and the BCR-ABL study.    ______________________________ Josph Macho, M.D. PRE/MEDQ  D:  03/25/2013  T:  03/26/2013  Job:  [id number]

## 2013-04-01 ENCOUNTER — Encounter: Payer: Self-pay | Admitting: Hematology & Oncology

## 2013-04-05 ENCOUNTER — Encounter: Payer: Self-pay | Admitting: Hematology & Oncology

## 2013-04-14 ENCOUNTER — Telehealth: Payer: Self-pay | Admitting: *Deleted

## 2013-04-14 NOTE — Telephone Encounter (Signed)
Left message on vm stating all of her lab results came back showing no obvious signs of leukemia. She most likely has an elevated WBC count due to her other chronic conditions per Dr Myna Hidalgo. This can be monitored by her PCP. To call back if further questions.

## 2014-02-15 ENCOUNTER — Other Ambulatory Visit (HOSPITAL_COMMUNITY)
Admission: RE | Admit: 2014-02-15 | Discharge: 2014-02-15 | Disposition: A | Discharge status: Home / Self Care | Payer: Medicaid Other | Source: Ambulatory Visit | Attending: Family Medicine | Admitting: Family Medicine

## 2014-02-15 ENCOUNTER — Other Ambulatory Visit: Payer: Self-pay | Admitting: Family Medicine

## 2014-02-15 DIAGNOSIS — Z124 Encounter for screening for malignant neoplasm of cervix: Secondary | ICD-10-CM | POA: Insufficient documentation

## 2014-02-15 DIAGNOSIS — Z1151 Encounter for screening for human papillomavirus (HPV): Secondary | ICD-10-CM | POA: Insufficient documentation

## 2014-02-17 LAB — CYTOLOGY - PAP

## 2014-03-01 ENCOUNTER — Other Ambulatory Visit: Payer: Self-pay | Admitting: Family Medicine

## 2014-03-01 ENCOUNTER — Ambulatory Visit
Admission: RE | Admit: 2014-03-01 | Discharge: 2014-03-01 | Disposition: A | Discharge status: Home / Self Care | Payer: Medicaid Other | Source: Ambulatory Visit | Attending: Family Medicine | Admitting: Family Medicine

## 2014-03-01 DIAGNOSIS — M25551 Pain in right hip: Secondary | ICD-10-CM

## 2014-06-23 ENCOUNTER — Other Ambulatory Visit: Payer: Self-pay | Admitting: Orthopedic Surgery

## 2014-07-05 ENCOUNTER — Inpatient Hospital Stay (HOSPITAL_COMMUNITY): Admission: RE | Admit: 2014-07-05 | Payer: Medicaid Other | Source: Ambulatory Visit

## 2014-07-13 ENCOUNTER — Encounter (HOSPITAL_COMMUNITY): Admission: RE | Payer: Self-pay | Source: Ambulatory Visit

## 2014-07-13 ENCOUNTER — Inpatient Hospital Stay (HOSPITAL_COMMUNITY): Admission: RE | Admit: 2014-07-13 | Payer: Medicaid Other | Source: Ambulatory Visit | Admitting: Orthopedic Surgery

## 2014-07-13 SURGERY — ARTHROPLASTY, HIP, TOTAL,POSTERIOR APPROACH
Anesthesia: Choice | Site: Hip | Laterality: Right

## 2014-08-08 NOTE — Pre-Procedure Instructions (Signed)
Jordon Bourquin  08/08/2014   Your procedure is scheduled on:  Monday, December 7th  Report to [facility] Admitting at 10 AM.  Call this number if you have problems the morning of surgery: [phone]   Remember:   Do not eat food or drink liquids after midnight.   Take these medicines the morning of surgery with A SIP OF WATER: cymbalta, prilosec, propranolol  Stop taking aspirin, OTC vitamins/herbal medications, NSAIDS (ibuprofen, advil, motrin) 7 days prior to surgery.   Do not wear jewelry, make-up or nail polish.  Do not wear lotions, powders, or perfumes,deodorant.  Do not shave 48 hours prior to surgery. Men may shave face and neck.  Do not bring valuables to the hospital.  [facility] is not responsible for any belongings or valuables.               Contacts, dentures or bridgework may not be worn into surgery.  Leave suitcase in the car. After surgery it may be brought to your room.  For patients admitted to the hospital, discharge time is determined by your treatment team.            Please read over the following fact sheets that you were given: Pain Booklet, Coughing and Deep Breathing, Blood Transfusion Information, MRSA Information and Surgical Site Infection Prevention  [facility] - Preparing for Surgery  Before surgery, you can play an important role.  Because skin is not sterile, your skin needs to be as free of germs as possible.  You can reduce the number of germs on you skin by washing with CHG (chlorahexidine gluconate) soap before surgery.  CHG is an antiseptic cleaner which kills germs and bonds with the skin to continue killing germs even after washing.  Please DO NOT use if you have an allergy to CHG or antibacterial soaps.  If your skin becomes reddened/irritated stop using the CHG and inform your nurse when you arrive at Short Stay.  Do not shave (including legs and underarms) for at least 48 hours prior to the first CHG shower.  You may shave your  face.  Please follow these instructions carefully:   1.  Shower with CHG Soap the night before surgery and the morning of Surgery.  2.  If you choose to wash your hair, wash your hair first as usual with your normal shampoo.  3.  After you shampoo, rinse your hair and body thoroughly to remove the shampoo.  4.  Use CHG as you would any other liquid soap.  You can apply CHG directly to the skin and wash gently with scrungie or a clean washcloth.  5.  Apply the CHG Soap to your body ONLY FROM THE NECK DOWN.  Do not use on open wounds or open sores.  Avoid contact with your eyes, ears, mouth and genitals (private parts).  Wash genitals (private parts) with your normal soap.  6.  Wash thoroughly, paying special attention to the area where your surgery will be performed.  7.  Thoroughly rinse your body with warm water from the neck down.  8.  DO NOT shower/wash with your normal soap after using and rinsing off the CHG Soap.  9.  Pat yourself dry with a clean towel.            10.  Wear clean pajamas.            11.  Place clean sheets on your bed the night of your first shower  and do not sleep with pets.  Day of Surgery  Do not apply any lotions/deoderants the morning of surgery.  Please wear clean clothes to the hospital/surgery center.

## 2014-08-09 ENCOUNTER — Encounter (HOSPITAL_COMMUNITY): Payer: Self-pay

## 2014-08-09 ENCOUNTER — Encounter (HOSPITAL_COMMUNITY)
Admission: RE | Admit: 2014-08-09 | Discharge: 2014-08-09 | Disposition: A | Discharge status: Home / Self Care | Payer: Medicaid Other | Source: Ambulatory Visit | Attending: Orthopedic Surgery | Admitting: Orthopedic Surgery

## 2014-08-09 HISTORY — DX: Essential (primary) hypertension: I10

## 2014-08-09 HISTORY — DX: Gastro-esophageal reflux disease without esophagitis: K21.9

## 2014-08-09 HISTORY — DX: Depression, unspecified: F32.A

## 2014-08-09 HISTORY — DX: Major depressive disorder, single episode, unspecified: F32.9

## 2014-08-09 HISTORY — DX: Anxiety disorder, unspecified: F41.9

## 2014-08-09 HISTORY — DX: Anesthesia of skin: R20.2

## 2014-08-09 HISTORY — DX: Unspecified osteoarthritis, unspecified site: M19.90

## 2014-08-09 HISTORY — DX: Paresthesia of skin: R20.0

## 2014-08-09 HISTORY — DX: Cerebral infarction, unspecified: I63.9

## 2014-08-09 HISTORY — DX: Other reaction to spinal and lumbar puncture: G97.1

## 2014-08-09 LAB — CBC WITH DIFFERENTIAL/PLATELET
BASOS ABS: 0 10*3/uL (ref 0.0–0.1)
Basophils Relative: 0 % (ref 0–1)
Eosinophils Absolute: 0.2 10*3/uL (ref 0.0–0.7)
Eosinophils Relative: 1 % (ref 0–5)
HCT: 42 % (ref 36.0–46.0)
HEMOGLOBIN: 13.7 g/dL (ref 12.0–15.0)
Lymphocytes Relative: 27 % (ref 12–46)
Lymphs Abs: 3.6 10*3/uL (ref 0.7–4.0)
MCH: 30.8 pg (ref 26.0–34.0)
MCHC: 32.6 g/dL (ref 30.0–36.0)
MCV: 94.4 fL (ref 78.0–100.0)
Monocytes Absolute: 0.9 10*3/uL (ref 0.1–1.0)
Monocytes Relative: 7 % (ref 3–12)
NEUTROS ABS: 8.6 10*3/uL — AB (ref 1.7–7.7)
NEUTROS PCT: 65 % (ref 43–77)
Platelets: 354 10*3/uL (ref 150–400)
RBC: 4.45 MIL/uL (ref 3.87–5.11)
RDW: 13.5 % (ref 11.5–15.5)
WBC: 13.3 10*3/uL — ABNORMAL HIGH (ref 4.0–10.5)

## 2014-08-09 LAB — APTT: APTT: 35 s (ref 24–37)

## 2014-08-09 LAB — BASIC METABOLIC PANEL
ANION GAP: 15 (ref 5–15)
BUN: 12 mg/dL (ref 6–23)
CALCIUM: 10.1 mg/dL (ref 8.4–10.5)
CHLORIDE: 99 meq/L (ref 96–112)
CO2: 26 mEq/L (ref 19–32)
Creatinine, Ser: 0.7 mg/dL (ref 0.50–1.10)
GFR calc non Af Amer: >90 mL/min (ref >90)
Glucose, Bld: 93 mg/dL (ref 70–99)
Potassium: 4.1 mEq/L (ref 3.7–5.3)
Sodium: 140 mEq/L (ref 137–147)

## 2014-08-09 LAB — SURGICAL PCR SCREEN
MRSA, PCR: NEGATIVE
STAPHYLOCOCCUS AUREUS: NEGATIVE

## 2014-08-09 LAB — PROTIME-INR
INR: 0.96 (ref 0.00–1.49)
PROTHROMBIN TIME: 12.9 s (ref 11.6–15.2)

## 2014-08-09 LAB — ABO/RH: ABO/RH(D): AB POS

## 2014-08-09 NOTE — Progress Notes (Signed)
Primary - dr. Orland Penman - [facility] No cardiologist No prior cardiac testing  [facility] routinely "up" has seen hemotologist for this in [place].

## 2014-08-10 LAB — URINE CULTURE: Colony Count: >=100000

## 2014-08-11 NOTE — H&P (Signed)
TOTAL HIP ADMISSION H&P  Patient is admitted for right total hip arthroplasty.  Subjective:  Chief Complaint: right hip pain  HPI: Julie Turner, 58 y.o. female, has a history of pain and functional disability in the right hip(s) due to arthritis and patient has failed non-surgical conservative treatments for greater than 12 weeks to include NSAID's and/or analgesics, corticosteriod injections, weight reduction as appropriate and activity modification.  Onset of symptoms was abrupt starting 2 years ago with gradually worsening course since that time.The patient noted no past surgery on the right hip(s).  Patient currently rates pain in the right hip at 10 out of 10 with activity. Patient has night pain, worsening of pain with activity and weight bearing, pain that interfers with activities of daily living, pain with passive range of motion, crepitus and joint swelling. Patient has evidence of joint space narrowing by imaging studies. This condition presents safety issues increasing the risk of falls.  There is no current active infection.  There are no active problems to display for this patient.  Past Medical History  Diagnosis Date  . Spinal headache   . Hypertension   . Stroke     "silent stroke" per her neurologist  . Anxiety   . Depression   . GERD (gastroesophageal reflux disease)   . Numbness and tingling in left hand   . Arthritis   . Cancer 2001    breast cancer    Past Surgical History  Procedure Laterality Date  . Cesarean section    . Breast surgery      2 lumpectomy and lymph nodes removed on right  . Breast reconstruction      on left  . Foot surgery Left     joint cleaned out    No prescriptions prior to admission   Allergies  Allergen Reactions  . Statins Other (See Comments)    Terrible heartburn  . Tape Other (See Comments)    Surgical tape/adhesive -- Pulled off skin  . Arimidex [Anastrozole]     Makes me feel crazy  . Tamoxifen Citrate     Makes me  feel crazy    History  Substance Use Topics  . Smoking status: Current Every Day Smoker -- 0.50 packs/day for 20 years    Types: Cigarettes  . Smokeless tobacco: Not on file  . Alcohol Use: Yes     Comment: rarely    No family history on file.   Review of Systems  Constitutional: Positive for malaise/fatigue and diaphoresis.  HENT: Positive for congestion and tinnitus.   Gastrointestinal: Positive for heartburn, nausea and diarrhea.  Musculoskeletal: Positive for myalgias and joint pain.  Neurological: Positive for headaches.  Psychiatric/Behavioral: The patient is nervous/anxious and has insomnia.     Objective:  Physical Exam  Constitutional: She is oriented to person, place, and time. She appears well-developed and well-nourished.  HENT:  Head: Normocephalic and atraumatic.  Eyes: Conjunctivae are normal. Pupils are equal, round, and reactive to light.  Neck: Normal range of motion. Neck supple.  Cardiovascular: Intact distal pulses.   Respiratory: Effort normal.  Musculoskeletal: She exhibits tenderness.  he patient does have intense pain with internal or external rotation of the right hip.  No pain with internal rotation of the left hip.  She continues to have tenderness in the groin.  She has brisk capillary refill and is neurovascularly intact distally.  Neurological: She is alert and oriented to person, place, and time.  Skin: Skin is warm and dry.  Psychiatric: She has a normal mood and affect. Her behavior is normal. Judgment and thought content normal.    Vital signs in last 24 hours:    Labs:   Estimated body mass index is 34.11 kg/(m^2) as calculated from the following:   Height as of 03/25/13: 5\' 5"  (1.651 m).   Weight as of 03/25/13: 92.987 kg (205 lb).   Imaging Review Plain radiographs demonstrate that the right hip does appear to have mild to moderate arthritis with joint space narrowing.  Assessment/Plan:  End stage arthritis, right hip(s)  The  patient history, physical examination, clinical judgement of the provider and imaging studies are consistent with end stage degenerative joint disease of the right hip(s) and total hip arthroplasty is deemed medically necessary. The treatment options including medical management, injection therapy, arthroscopy and arthroplasty were discussed at length. The risks and benefits of total hip arthroplasty were presented and reviewed. The risks due to aseptic loosening, infection, stiffness, dislocation/subluxation,  thromboembolic complications and other imponderables were discussed.  The patient acknowledged the explanation, agreed to proceed with the plan and consent was signed. Patient is being admitted for inpatient treatment for surgery, pain control, PT, OT, prophylactic antibiotics, VTE prophylaxis, progressive ambulation and ADL's and discharge planning.The patient is planning to be discharged home with home health services

## 2014-08-14 MED ORDER — CEFAZOLIN SODIUM-DEXTROSE 2-3 GM-% IV SOLR
2.0000 g | INTRAVENOUS | Status: AC
  Administered 2014-08-15: 2 g via INTRAVENOUS
  Filled 2014-08-14: qty 50

## 2014-08-14 NOTE — Anesthesia Preprocedure Evaluation (Addendum)
Anesthesia Evaluation  Patient identified by MRN, date of birth, ID band Patient awake    Reviewed: Allergy & Precautions, H&P , NPO status , Patient's Chart, lab work & pertinent test results, reviewed documented beta blocker date and time   History of Anesthesia Complications (+) POST - OP SPINAL HEADACHE  Airway Mallampati: III   Neck ROM: Limited    Dental  (+) Teeth Intact   Pulmonary Current Smoker (20 pack yr hx),  breath sounds clear to auscultation        Cardiovascular hypertension, Pt. on medications  EKG ok   Neuro/Psych  Headaches, Anxiety Depression    GI/Hepatic GERD-  ,  Endo/Other    Renal/GU      Musculoskeletal   Abdominal (+) + obese,   Peds  Hematology   Anesthesia Other Findings Small mouth opening  Reproductive/Obstetrics                           Anesthesia Physical Anesthesia Plan  ASA: III  Anesthesia Plan: General   Post-op Pain Management:    Induction: Intravenous  Airway Management Planned: Oral ETT  Additional Equipment:   Intra-op Plan:   Post-operative Plan: Extubation in OR  Informed Consent: I have reviewed the patients History and Physical, chart, labs and discussed the procedure including the risks, benefits and alternatives for the proposed anesthesia with the patient or authorized representative who has indicated his/her understanding and acceptance.     Plan Discussed with:   Anesthesia Plan Comments: (Tylenol, precedex?, Decadron, Lidocaine)        Anesthesia Quick Evaluation

## 2014-08-15 ENCOUNTER — Inpatient Hospital Stay (HOSPITAL_COMMUNITY): Payer: Medicaid Other | Admitting: Anesthesiology

## 2014-08-15 ENCOUNTER — Encounter (HOSPITAL_COMMUNITY): Payer: Self-pay | Admitting: Certified Registered Nurse Anesthetist

## 2014-08-15 ENCOUNTER — Encounter (HOSPITAL_COMMUNITY)
Admission: RE | Disposition: A | Discharge status: Home / Self Care | Payer: Self-pay | Source: Ambulatory Visit | Attending: Orthopedic Surgery

## 2014-08-15 ENCOUNTER — Inpatient Hospital Stay (HOSPITAL_COMMUNITY): Payer: Medicaid Other

## 2014-08-15 ENCOUNTER — Inpatient Hospital Stay (HOSPITAL_COMMUNITY)
Admission: RE | Admit: 2014-08-15 | Discharge: 2014-08-16 | DRG: 470 | Disposition: A | Discharge status: Home / Self Care | Payer: Medicaid Other | Source: Ambulatory Visit | Attending: Orthopedic Surgery | Admitting: Orthopedic Surgery

## 2014-08-15 DIAGNOSIS — F17210 Nicotine dependence, cigarettes, uncomplicated: Secondary | ICD-10-CM | POA: Diagnosis present

## 2014-08-15 DIAGNOSIS — I10 Essential (primary) hypertension: Secondary | ICD-10-CM | POA: Diagnosis present

## 2014-08-15 DIAGNOSIS — G43909 Migraine, unspecified, not intractable, without status migrainosus: Secondary | ICD-10-CM | POA: Diagnosis present

## 2014-08-15 DIAGNOSIS — Z853 Personal history of malignant neoplasm of breast: Secondary | ICD-10-CM

## 2014-08-15 DIAGNOSIS — F419 Anxiety disorder, unspecified: Secondary | ICD-10-CM | POA: Diagnosis present

## 2014-08-15 DIAGNOSIS — M25551 Pain in right hip: Secondary | ICD-10-CM | POA: Diagnosis present

## 2014-08-15 DIAGNOSIS — Z888 Allergy status to other drugs, medicaments and biological substances status: Secondary | ICD-10-CM | POA: Diagnosis not present

## 2014-08-15 DIAGNOSIS — E780 Pure hypercholesterolemia: Secondary | ICD-10-CM | POA: Diagnosis present

## 2014-08-15 DIAGNOSIS — Z96649 Presence of unspecified artificial hip joint: Secondary | ICD-10-CM

## 2014-08-15 DIAGNOSIS — F329 Major depressive disorder, single episode, unspecified: Secondary | ICD-10-CM | POA: Diagnosis present

## 2014-08-15 DIAGNOSIS — M797 Fibromyalgia: Secondary | ICD-10-CM | POA: Diagnosis present

## 2014-08-15 DIAGNOSIS — K219 Gastro-esophageal reflux disease without esophagitis: Secondary | ICD-10-CM | POA: Diagnosis present

## 2014-08-15 DIAGNOSIS — Z8673 Personal history of transient ischemic attack (TIA), and cerebral infarction without residual deficits: Secondary | ICD-10-CM

## 2014-08-15 DIAGNOSIS — M13851 Other specified arthritis, right hip: Secondary | ICD-10-CM | POA: Diagnosis present

## 2014-08-15 DIAGNOSIS — M1611 Unilateral primary osteoarthritis, right hip: Secondary | ICD-10-CM | POA: Diagnosis present

## 2014-08-15 HISTORY — DX: Migraine, unspecified, not intractable, without status migrainosus: G43.909

## 2014-08-15 HISTORY — PX: TOTAL HIP ARTHROPLASTY: SHX124

## 2014-08-15 HISTORY — DX: Malignant neoplasm of unspecified site of right female breast: C50.911

## 2014-08-15 HISTORY — DX: Pure hypercholesterolemia, unspecified: E78.00

## 2014-08-15 HISTORY — DX: Fibromyalgia: M79.7

## 2014-08-15 SURGERY — ARTHROPLASTY, HIP, TOTAL,POSTERIOR APPROACH
Anesthesia: General | Site: Hip | Laterality: Right

## 2014-08-15 MED ORDER — LACTATED RINGERS IV SOLN
INTRAVENOUS | Status: DC
  Administered 2014-08-15: 11:00:00 via INTRAVENOUS

## 2014-08-15 MED ORDER — MAGNESIUM CITRATE PO SOLN: 1.0000 | Freq: Once | ORAL | Status: AC | PRN

## 2014-08-15 MED ORDER — KCL IN DEXTROSE-NACL 40-5-0.45 MEQ/L-%-% IV SOLN
INTRAVENOUS | Status: DC
  Filled 2014-08-15 (×2): qty 1000

## 2014-08-15 MED ORDER — LORAZEPAM 1 MG PO TABS
1.0000 mg | ORAL_TABLET | Freq: Once | ORAL | Status: AC
  Administered 2014-08-15: 1 mg via ORAL
  Filled 2014-08-15: qty 1

## 2014-08-15 MED ORDER — PANTOPRAZOLE SODIUM 40 MG PO TBEC
40.0000 mg | DELAYED_RELEASE_TABLET | Freq: Every day | ORAL | Status: DC
  Administered 2014-08-16: 40 mg via ORAL
  Filled 2014-08-15: qty 1

## 2014-08-15 MED ORDER — LIDOCAINE HCL (CARDIAC) 20 MG/ML IV SOLN
INTRAVENOUS | Status: AC
  Filled 2014-08-15: qty 5

## 2014-08-15 MED ORDER — METHOCARBAMOL 500 MG PO TABS
ORAL_TABLET | ORAL | Status: AC
  Filled 2014-08-15: qty 1

## 2014-08-15 MED ORDER — METOCLOPRAMIDE HCL 5 MG/ML IJ SOLN: 5.0000 mg | Freq: Three times a day (TID) | INTRAMUSCULAR | Status: DC | PRN

## 2014-08-15 MED ORDER — FENTANYL CITRATE 0.05 MG/ML IJ SOLN
INTRAMUSCULAR | Status: AC
  Filled 2014-08-15: qty 5

## 2014-08-15 MED ORDER — FENTANYL CITRATE 0.05 MG/ML IJ SOLN
INTRAMUSCULAR | Status: AC
  Filled 2014-08-15: qty 2

## 2014-08-15 MED ORDER — MIDAZOLAM HCL 2 MG/2ML IJ SOLN
INTRAMUSCULAR | Status: AC
  Filled 2014-08-15: qty 2

## 2014-08-15 MED ORDER — WHITE PETROLATUM GEL
Status: AC
  Administered 2014-08-15: 21:00:00
  Filled 2014-08-15: qty 5

## 2014-08-15 MED ORDER — ONDANSETRON HCL 4 MG/2ML IJ SOLN
INTRAMUSCULAR | Status: AC
  Filled 2014-08-15: qty 2

## 2014-08-15 MED ORDER — ONDANSETRON HCL 4 MG PO TABS: 4.0000 mg | ORAL_TABLET | Freq: Four times a day (QID) | ORAL | Status: DC | PRN

## 2014-08-15 MED ORDER — SODIUM CHLORIDE 0.9 % IR SOLN
Status: DC | PRN
Start: 2014-08-15 — End: 2014-08-15
  Administered 2014-08-15: 1000 mL

## 2014-08-15 MED ORDER — KCL IN DEXTROSE-NACL 20-5-0.45 MEQ/L-%-% IV SOLN
INTRAVENOUS | Status: AC
  Filled 2014-08-15: qty 1000

## 2014-08-15 MED ORDER — BUPIVACAINE-EPINEPHRINE 0.5% -1:200000 IJ SOLN
INTRAMUSCULAR | Status: DC | PRN
  Administered 2014-08-15: 10 mL

## 2014-08-15 MED ORDER — CEFUROXIME SODIUM 1.5 G IJ SOLR
INTRAMUSCULAR | Status: AC
  Filled 2014-08-15: qty 1.5

## 2014-08-15 MED ORDER — PROPOFOL 10 MG/ML IV BOLUS
INTRAVENOUS | Status: AC
  Filled 2014-08-15: qty 20

## 2014-08-15 MED ORDER — TRANEXAMIC ACID 100 MG/ML IV SOLN
2000.0000 mg | INTRAVENOUS | Status: DC
  Filled 2014-08-15: qty 20

## 2014-08-15 MED ORDER — DULOXETINE HCL 60 MG PO CPEP
60.0000 mg | ORAL_CAPSULE | Freq: Every day | ORAL | Status: DC
  Administered 2014-08-16: 60 mg via ORAL
  Filled 2014-08-15: qty 1

## 2014-08-15 MED ORDER — BISACODYL 5 MG PO TBEC: 5.0000 mg | DELAYED_RELEASE_TABLET | Freq: Every day | ORAL | Status: DC | PRN

## 2014-08-15 MED ORDER — ROCURONIUM BROMIDE 50 MG/5ML IV SOLN
INTRAVENOUS | Status: AC
  Filled 2014-08-15: qty 1

## 2014-08-15 MED ORDER — ROCURONIUM BROMIDE 100 MG/10ML IV SOLN
INTRAVENOUS | Status: DC | PRN
  Administered 2014-08-15: 50 mg via INTRAVENOUS

## 2014-08-15 MED ORDER — LIDOCAINE HCL (CARDIAC) 20 MG/ML IV SOLN
INTRAVENOUS | Status: DC | PRN
  Administered 2014-08-15: 100 mg via INTRAVENOUS

## 2014-08-15 MED ORDER — METOCLOPRAMIDE HCL 10 MG PO TABS: 5.0000 mg | ORAL_TABLET | Freq: Three times a day (TID) | ORAL | Status: DC | PRN

## 2014-08-15 MED ORDER — EPHEDRINE SULFATE 50 MG/ML IJ SOLN
INTRAMUSCULAR | Status: AC
  Filled 2014-08-15: qty 1

## 2014-08-15 MED ORDER — ASPIRIN EC 325 MG PO TBEC: 325.0000 mg | DELAYED_RELEASE_TABLET | Freq: Two times a day (BID) | ORAL | Status: DC

## 2014-08-15 MED ORDER — HYDROCHLOROTHIAZIDE 25 MG PO TABS
12.5000 mg | ORAL_TABLET | Freq: Every day | ORAL | Status: DC
  Filled 2014-08-15: qty 0.5

## 2014-08-15 MED ORDER — ASPIRIN EC 325 MG PO TBEC
325.0000 mg | DELAYED_RELEASE_TABLET | Freq: Every day | ORAL | Status: DC
  Administered 2014-08-16: 325 mg via ORAL
  Filled 2014-08-15 (×2): qty 1

## 2014-08-15 MED ORDER — CIPROFLOXACIN HCL 500 MG PO TABS
500.0000 mg | ORAL_TABLET | Freq: Two times a day (BID) | ORAL | Status: DC
  Administered 2014-08-15 – 2014-08-16 (×2): 500 mg via ORAL
  Filled 2014-08-15 (×5): qty 1

## 2014-08-15 MED ORDER — KCL IN DEXTROSE-NACL 20-5-0.45 MEQ/L-%-% IV SOLN
INTRAVENOUS | Status: DC
  Administered 2014-08-16: 02:00:00 via INTRAVENOUS
  Filled 2014-08-15 (×6): qty 1000

## 2014-08-15 MED ORDER — DEXTROSE 5 % IV SOLN: 500.0000 mg | Freq: Four times a day (QID) | INTRAVENOUS | Status: DC | PRN

## 2014-08-15 MED ORDER — ACETAMINOPHEN 650 MG RE SUPP: 650.0000 mg | Freq: Four times a day (QID) | RECTAL | Status: DC | PRN

## 2014-08-15 MED ORDER — GLYCOPYRROLATE 0.2 MG/ML IJ SOLN
INTRAMUSCULAR | Status: DC | PRN
  Administered 2014-08-15: 0.6 mg via INTRAVENOUS

## 2014-08-15 MED ORDER — PROPRANOLOL HCL ER 80 MG PO CP24
80.0000 mg | ORAL_CAPSULE | Freq: Every day | ORAL | Status: DC
  Administered 2014-08-16: 80 mg via ORAL
  Filled 2014-08-15: qty 1

## 2014-08-15 MED ORDER — FENTANYL CITRATE 0.05 MG/ML IJ SOLN
25.0000 ug | INTRAMUSCULAR | Status: DC | PRN
  Administered 2014-08-15 (×3): 50 ug via INTRAVENOUS

## 2014-08-15 MED ORDER — ONDANSETRON HCL 4 MG/2ML IJ SOLN
INTRAMUSCULAR | Status: DC | PRN
  Administered 2014-08-15: 4 mg via INTRAVENOUS

## 2014-08-15 MED ORDER — LORAZEPAM 1 MG PO TABS: 1.0000 mg | ORAL_TABLET | Freq: Once | ORAL | Status: DC

## 2014-08-15 MED ORDER — SENNOSIDES-DOCUSATE SODIUM 8.6-50 MG PO TABS: 1.0000 | ORAL_TABLET | Freq: Every evening | ORAL | Status: DC | PRN

## 2014-08-15 MED ORDER — MIDAZOLAM HCL 5 MG/5ML IJ SOLN
INTRAMUSCULAR | Status: DC | PRN
  Administered 2014-08-15: 2 mg via INTRAVENOUS

## 2014-08-15 MED ORDER — MENTHOL 3 MG MT LOZG
1.0000 | LOZENGE | OROMUCOSAL | Status: DC | PRN
  Filled 2014-08-15: qty 9

## 2014-08-15 MED ORDER — HYDROMORPHONE HCL 1 MG/ML IJ SOLN
1.0000 mg | INTRAMUSCULAR | Status: DC | PRN
  Administered 2014-08-15 – 2014-08-16 (×3): 1 mg via INTRAVENOUS
  Filled 2014-08-15 (×4): qty 1

## 2014-08-15 MED ORDER — BUPIVACAINE-EPINEPHRINE (PF) 0.5% -1:200000 IJ SOLN
INTRAMUSCULAR | Status: AC
  Filled 2014-08-15: qty 30

## 2014-08-15 MED ORDER — DEXTROSE 5 % IV SOLN
10.0000 mg | INTRAVENOUS | Status: DC | PRN
  Administered 2014-08-15: 40 ug/min via INTRAVENOUS

## 2014-08-15 MED ORDER — PHENOL 1.4 % MT LIQD: 1.0000 | OROMUCOSAL | Status: DC | PRN

## 2014-08-15 MED ORDER — PROPOFOL 10 MG/ML IV BOLUS
INTRAVENOUS | Status: DC | PRN
  Administered 2014-08-15: 160 mg via INTRAVENOUS

## 2014-08-15 MED ORDER — KETOROLAC TROMETHAMINE 15 MG/ML IJ SOLN
INTRAMUSCULAR | Status: DC | PRN
  Administered 2014-08-15: 15 mg via INTRAVENOUS

## 2014-08-15 MED ORDER — METHOCARBAMOL 500 MG PO TABS
500.0000 mg | ORAL_TABLET | Freq: Four times a day (QID) | ORAL | Status: DC | PRN
  Administered 2014-08-15 – 2014-08-16 (×4): 500 mg via ORAL
  Filled 2014-08-15 (×3): qty 1

## 2014-08-15 MED ORDER — DIPHENHYDRAMINE HCL 12.5 MG/5ML PO ELIX: 12.5000 mg | ORAL_SOLUTION | ORAL | Status: DC | PRN

## 2014-08-15 MED ORDER — ACETAMINOPHEN 325 MG PO TABS
650.0000 mg | ORAL_TABLET | Freq: Four times a day (QID) | ORAL | Status: DC | PRN
  Administered 2014-08-16: 650 mg via ORAL
  Filled 2014-08-15 (×2): qty 2

## 2014-08-15 MED ORDER — ALPRAZOLAM 0.5 MG PO TABS
1.0000 mg | ORAL_TABLET | Freq: Every evening | ORAL | Status: DC | PRN
  Administered 2014-08-16: 1 mg via ORAL
  Filled 2014-08-15: qty 2

## 2014-08-15 MED ORDER — NEOSTIGMINE METHYLSULFATE 10 MG/10ML IV SOLN
INTRAVENOUS | Status: DC | PRN
  Administered 2014-08-15: 4 mg via INTRAVENOUS

## 2014-08-15 MED ORDER — METHOCARBAMOL 500 MG PO TABS: 500.0000 mg | ORAL_TABLET | Freq: Two times a day (BID) | ORAL | Status: DC

## 2014-08-15 MED ORDER — PROMETHAZINE HCL 25 MG/ML IJ SOLN: 6.2500 mg | INTRAMUSCULAR | Status: DC | PRN

## 2014-08-15 MED ORDER — EPHEDRINE SULFATE 50 MG/ML IJ SOLN
INTRAMUSCULAR | Status: DC | PRN
  Administered 2014-08-15 (×3): 10 mg via INTRAVENOUS

## 2014-08-15 MED ORDER — ONDANSETRON HCL 4 MG/2ML IJ SOLN: 4.0000 mg | Freq: Four times a day (QID) | INTRAMUSCULAR | Status: DC | PRN

## 2014-08-15 MED ORDER — MEPERIDINE HCL 25 MG/ML IJ SOLN: 6.2500 mg | INTRAMUSCULAR | Status: DC | PRN

## 2014-08-15 MED ORDER — OXYCODONE-ACETAMINOPHEN 5-325 MG PO TABS: 1.0000 | ORAL_TABLET | ORAL | Status: DC | PRN

## 2014-08-15 MED ORDER — OXYCODONE HCL 5 MG PO TABS
5.0000 mg | ORAL_TABLET | ORAL | Status: DC | PRN
  Administered 2014-08-15 – 2014-08-16 (×5): 10 mg via ORAL
  Filled 2014-08-15 (×4): qty 2

## 2014-08-15 MED ORDER — SODIUM CHLORIDE 0.9 % IJ SOLN
INTRAMUSCULAR | Status: AC
  Filled 2014-08-15: qty 10

## 2014-08-15 MED ORDER — LACTATED RINGERS IV SOLN
INTRAVENOUS | Status: DC | PRN
  Administered 2014-08-15 (×2): via INTRAVENOUS

## 2014-08-15 MED ORDER — FENTANYL CITRATE 0.05 MG/ML IJ SOLN
INTRAMUSCULAR | Status: DC | PRN
  Administered 2014-08-15: 100 ug via INTRAVENOUS
  Administered 2014-08-15 (×2): 50 ug via INTRAVENOUS

## 2014-08-15 MED ORDER — ACETAMINOPHEN 10 MG/ML IV SOLN
1000.0000 mg | INTRAVENOUS | Status: AC
  Administered 2014-08-15: 1000 mg via INTRAVENOUS
  Filled 2014-08-15: qty 100

## 2014-08-15 MED ORDER — OXYCODONE HCL 5 MG PO TABS
ORAL_TABLET | ORAL | Status: AC
  Filled 2014-08-15: qty 2

## 2014-08-15 MED ORDER — DOCUSATE SODIUM 100 MG PO CAPS
100.0000 mg | ORAL_CAPSULE | Freq: Two times a day (BID) | ORAL | Status: DC
  Administered 2014-08-15 – 2014-08-16 (×2): 100 mg via ORAL
  Filled 2014-08-15 (×2): qty 1

## 2014-08-15 SURGICAL SUPPLY — 55 items
BLADE SAW SGTL 18X1.27X75 (BLADE) ×2 IMPLANT
BLADE SAW SGTL 18X1.27X75MM (BLADE) ×1
BRUSH FEMORAL CANAL (MISCELLANEOUS) IMPLANT
CAPT HIP TOTAL 2 ×2 IMPLANT
COVER BACK TABLE 24X17X13 BIG (DRAPES) IMPLANT
COVER SURGICAL LIGHT HANDLE (MISCELLANEOUS) ×6 IMPLANT
DRAPE IMP U-DRAPE 54X76 (DRAPES) ×3 IMPLANT
DRAPE ORTHO SPLIT 77X108 STRL (DRAPES) ×3
DRAPE PROXIMA HALF (DRAPES) ×3 IMPLANT
DRAPE SURG ORHT 6 SPLT 77X108 (DRAPES) ×1 IMPLANT
DRAPE U-SHAPE 47X51 STRL (DRAPES) ×3 IMPLANT
DRILL BIT 7/64X5 (BIT) ×3 IMPLANT
DRSG AQUACEL AG ADV 3.5X10 (GAUZE/BANDAGES/DRESSINGS) ×3 IMPLANT
DURAPREP 26ML APPLICATOR (WOUND CARE) ×3 IMPLANT
ELECT BLADE 4.0 EZ CLEAN MEGAD (MISCELLANEOUS) ×3
ELECT REM PT RETURN 9FT ADLT (ELECTROSURGICAL) ×3
ELECTRODE BLDE 4.0 EZ CLN MEGD (MISCELLANEOUS) IMPLANT
ELECTRODE REM PT RTRN 9FT ADLT (ELECTROSURGICAL) ×1 IMPLANT
GAUZE XEROFORM 1X8 LF (GAUZE/BANDAGES/DRESSINGS) ×3 IMPLANT
GLOVE BIO SURGEON STRL SZ7.5 (GLOVE) ×3 IMPLANT
GLOVE BIO SURGEON STRL SZ8.5 (GLOVE) ×6 IMPLANT
GLOVE BIOGEL PI IND STRL 8 (GLOVE) ×2 IMPLANT
GLOVE BIOGEL PI IND STRL 9 (GLOVE) ×1 IMPLANT
GLOVE BIOGEL PI INDICATOR 8 (GLOVE) ×4
GLOVE BIOGEL PI INDICATOR 9 (GLOVE) ×2
GOWN STRL REUS W/ TWL LRG LVL3 (GOWN DISPOSABLE) ×2 IMPLANT
GOWN STRL REUS W/ TWL XL LVL3 (GOWN DISPOSABLE) ×3 IMPLANT
GOWN STRL REUS W/TWL LRG LVL3 (GOWN DISPOSABLE) ×6
GOWN STRL REUS W/TWL XL LVL3 (GOWN DISPOSABLE) ×9
HANDPIECE INTERPULSE COAX TIP (DISPOSABLE)
HOOD PEEL AWAY FACE SHEILD DIS (HOOD) ×6 IMPLANT
KIT BASIN OR (CUSTOM PROCEDURE TRAY) ×3 IMPLANT
KIT ROOM TURNOVER OR (KITS) ×3 IMPLANT
MANIFOLD NEPTUNE II (INSTRUMENTS) ×3 IMPLANT
NEEDLE 22X1 1/2 (OR ONLY) (NEEDLE) ×3 IMPLANT
NS IRRIG 1000ML POUR BTL (IV SOLUTION) ×3 IMPLANT
PACK TOTAL JOINT (CUSTOM PROCEDURE TRAY) ×3 IMPLANT
PACK UNIVERSAL I (CUSTOM PROCEDURE TRAY) ×3 IMPLANT
PAD ARMBOARD 7.5X6 YLW CONV (MISCELLANEOUS) ×6 IMPLANT
PASSER SUT SWANSON 36MM LOOP (INSTRUMENTS) ×3 IMPLANT
PRESSURIZER FEMORAL UNIV (MISCELLANEOUS) IMPLANT
SET HNDPC FAN SPRY TIP SCT (DISPOSABLE) IMPLANT
SUT ETHIBOND 2 V 37 (SUTURE) ×3 IMPLANT
SUT VIC AB 0 CTB1 27 (SUTURE) ×3 IMPLANT
SUT VIC AB 1 CTX 36 (SUTURE) ×3
SUT VIC AB 1 CTX36XBRD ANBCTR (SUTURE) ×1 IMPLANT
SUT VIC AB 2-0 CTB1 (SUTURE) ×3 IMPLANT
SUT VIC AB 3-0 SH 27 (SUTURE) ×3
SUT VIC AB 3-0 SH 27X BRD (SUTURE) ×1 IMPLANT
SYR CONTROL 10ML LL (SYRINGE) ×3 IMPLANT
TOWEL OR 17X24 6PK STRL BLUE (TOWEL DISPOSABLE) ×3 IMPLANT
TOWEL OR 17X26 10 PK STRL BLUE (TOWEL DISPOSABLE) ×3 IMPLANT
TOWER CARTRIDGE SMART MIX (DISPOSABLE) IMPLANT
TRAY FOLEY CATH 14FR (SET/KITS/TRAYS/PACK) IMPLANT
WATER STERILE IRR 1000ML POUR (IV SOLUTION) ×12 IMPLANT

## 2014-08-15 NOTE — Op Note (Signed)
OPERATIVE REPORT    DATE OF PROCEDURE:  08/15/2014       PREOPERATIVE DIAGNOSIS:  RIGHT HIP ARTHRITIS                                                          POSTOPERATIVE DIAGNOSIS:  Right Hip Arthritis                                                           PROCEDURE:  R total hip arthroplasty using a 52 mm DePuy Pinnacle  Cup, [employer], 10-degree polyethylene liner index superior  and posterior, a +3 36 mm ceramic head, a 18x13x150x42 SROM stem, 18DL Sleeve   SURGEON: Mykenna Viele J    ASSISTANT:   [employer]  (present throughout entire procedure and necessary for timely completion of the procedure)   ANESTHESIA: General BLOOD LOSS: 200 FLUID REPLACEMENT: 1500 crystalloid DRAINS: Foley Catheter URINE OUTPUT: 650PT COMPLICATIONS: none    INDICATIONS FOR PROCEDURE: A 58 y.o. year-old With  [facility]   for 5 years, x-rays show bone-on-bone arthritic changes. Despite conservative measures with observation, anti-inflammatory medicine, narcotics, use of a cane, has severe unremitting pain and can ambulate only a few blocks before resting.  Patient desires elective R total hip arthroplasty to decrease pain and increase function. The risks, benefits, and alternatives were discussed at length including but not limited to the risks of infection, bleeding, nerve injury, stiffness, blood clots, the need for revision surgery, cardiopulmonary complications, among others, and they were willing to proceed. Questions answered     PROCEDURE IN DETAIL: The patient was identified by armband,  received preoperative IV antibiotics in the holding area at [facility], taken to the operating room , appropriate anesthetic monitors  were attached and general endotracheal anesthesia induced. Foley catheter was inserted. Pt was rolled into the L lateral decubitus position and fixed there with a Stulberg Mark II pelvic clamp.  The R lower extremity was then prepped and  draped  in the usual sterile fashion from the ankle to the hemipelvis. A time-out  procedure was performed. The skin along the lateral hip and thigh  infiltrated with 10 mL of 0.5% Marcaine and epinephrine solution. We  then made a posterolateral approach to the hip. With a #10 blade, a 16 cm  incision was made through the skin and subcutaneous tissue down to the level of the  IT band. Small bleeders were identified and cauterized. The IT band was cut in  line with skin incision exposing the greater trochanter. A Cobra retractor was placed between the gluteus minimus and the superior hip joint capsule, and a spiked Cobra between the quadratus femoris and the inferior hip joint capsule. This isolated the short  external rotators and piriformis tendons. These were tagged with a #2 Ethibond  suture and cut off their insertion on the intertrochanteric crest. The posterior  capsule was then developed into an acetabular-based flap from Posterior Superior off of the acetabulum out over the femoral neck and back posterior inferior to the acetabular rim. This flap was tagged with two #2 Ethibond  sutures and retracted protecting the sciatic nerve. This exposed the arthritic femoral head and osteophytes. The hip was then flexed and internally rotated, dislocating the femoral head and a standard neck cut performed 1 fingerbreadth above the lesser trochanter.  A spiked Cobra was placed in the cotyloid notch and a Hohmann retractor was then used to lever the femur anteriorly off of the anterior pelvic column. A posterior-inferior wing retractor was placed at the junction of the acetabulum and the ischium completing the acetabular exposure.We then removed the peripheral osteophytes and labrum from the acetabulum. We then reamed the acetabulum up to 51 mm with basket reamers obtaining good coverage in all quadrants. We then irrigated with normal  saline solution and hammered into place a 52 mm pinnacle cup in 45   degrees of abduction and about 20 degrees of anteversion. More  peripheral osteophytes removed and a trial 10-degree liner placed with the  index superior-posterior. The hip was then flexed and internally rotated exposing the  proximal femur, which was entered with the initiating reamer followed by  the axial reamers up to a 13.5 mm full depth and 56mm partial depth. We then conically reamed to 18D to the correct depth for a 42 base neck. The calcar was milled to 18DL. A trial cone and stem was inserted in the 25 degrees anteversion, with a +0 3mm trial head. Trial reduction was then performed and excellent stability was noted with at 90 of flexion with 75 of internal rotation and then full extension with maximal external rotation. The hip could not be dislocated in full extension. The knee could easily flex  to about 130 degrees. We also stretched the abductors at this point,  because of the preexisting adductor contractures. All trial components  were then removed. The acetabulum was irrigated out with normal saline  solution. A titanium [facility] was then screwed into place  followed by a 10-degree polyethylene liner index superior-posterior. On  the femoral side a 18DL ZTT1 sleeve was hammered into place, followed by a 18x13x42x150 SROM stem in 25 degrees of anteversion. At this point, a +3 36 mm ceramic head was  hammered on the stem. The hip was reduced. We checked our stability  one more time and found it to be excellent. The wound was once again  thoroughly irrigated out with normal saline solution pulse lavage. The  capsular flap and short external rotators were repaired back to the  intertrochanteric crest through drill holes with a #2 Ethibond suture.  The IT band was closed with running 1 Vicryl suture. The subcutaneous  tissue with 0 and 2-0 undyed Vicryl suture and the skin with running  interlocking 3-0 nylon suture. Dressing of Xeroform and Mepilex was  then applied.  The patient was then unclamped, rolled supine, awaken extubated and taken to recovery room without difficulty in stable condition.   Samya Siciliano J 08/15/2014, 1:41 PM

## 2014-08-15 NOTE — Anesthesia Postprocedure Evaluation (Signed)
  Anesthesia Post-op Note  Patient: Julie Turner  Procedure(s) Performed: Procedure(s): RIGHT TOTAL HIP ARTHROPLASTY (Right)  Patient Location: PACU  Anesthesia Type:General  Level of Consciousness: awake, alert , oriented and patient cooperative  Airway and Oxygen Therapy: Patient Spontanous Breathing and Patient connected to nasal cannula oxygen  Post-op Pain: mild  Post-op Assessment: Post-op Vital signs reviewed, Patient's Cardiovascular Status Stable, Respiratory Function Stable, Patent Airway, No signs of Nausea or vomiting and Pain level controlled  Post-op Vital Signs: Reviewed and stable  Last Vitals:  Filed Vitals:   08/15/14 1531  BP: 119/71  Pulse: 84  Temp:   Resp: 14    Complications: No apparent anesthesia complications

## 2014-08-15 NOTE — Transfer of Care (Signed)
Immediate Anesthesia Transfer of Care Note  Patient: Julie Turner  Procedure(s) Performed: Procedure(s): RIGHT TOTAL HIP ARTHROPLASTY (Right)  Patient Location: PACU  Anesthesia Type:General  Level of Consciousness: awake, alert , oriented and patient cooperative  Airway & Oxygen Therapy: Patient Spontanous Breathing and Patient connected to nasal cannula oxygen  Post-op Assessment: Report given to PACU RN, Post -op Vital signs reviewed and stable and Patient moving all extremities X 4  Post vital signs: Reviewed and stable  Complications: No apparent anesthesia complications

## 2014-08-15 NOTE — Progress Notes (Signed)
Physical Therapy Evaluation Patient Details Name: Alvine Mostafa MRN: [id number] DOB: May 28, 1956 Today's Date: 08/15/2014   History of Present Illness  Patient is a 58 yo female admitted 08/15/14 now s/p Rt THA (posterior precautions).  PMH:  spinal headaches, HTN, CVA, anxiety, depression, arthritis, breast CA, tobacco abuse  Clinical Impression  Patient presents with problems listed below.  Patient with decreased safety awareness.  Will benefit from acute PT to maximize functional independence and safety prior to d/c home with assist of her mother.    Follow Up Recommendations Home health PT;Supervision/Assistance - 24 hour    Equipment Recommendations  3in1 (PT)    Recommendations for Other Services       Precautions / Restrictions Precautions Precautions: Posterior Hip Precaution Booklet Issued: Yes (comment) Precaution Comments: Reviewed posterior hip precautions with patient. Restrictions Weight Bearing Restrictions: Yes RLE Weight Bearing: Weight bearing as tolerated      Mobility  Bed Mobility Overal bed mobility: Needs Assistance Bed Mobility: Supine to Sit;Sit to Supine     Supine to sit: Supervision Sit to supine: Supervision   General bed mobility comments: Verbal cues for technique to maintain precautions.  Patient able to move RLE off of and onto bed without physical assist.  Assist for safety only.  Repeated cuing for hip precautions during mobility and sitting on EOB.  Transfers Overall transfer level: Needs assistance Equipment used: Rolling walker (2 wheeled) Transfers: Sit to/from Stand Sit to Stand: Min guard         General transfer comment: Verbal cues for hand placement and technique.  Patient initiated standing prior to RW being in place.  Repeated cuing for safety - patient impulsive with decreased safety awareness.  Ambulation/Gait Ambulation/Gait assistance: Min guard Ambulation Distance (Feet): 76 Feet Assistive device: Rolling walker (2  wheeled) Gait Pattern/deviations: Step-through pattern;Decreased stance time - right;Decreased step length - left;Decreased weight shift to right;Trunk flexed Gait velocity: Very quick initially.  Cues to slow speed to safe level.   General Gait Details: Verbal cues for safe use of RW.  Required mult cues to slow to safe pace.  Reviewed turning toward non-operative side - needed repeated cuing for this.  Stairs            Wheelchair Mobility    Modified Rankin (Stroke Patients Only)       Balance                                             Pertinent Vitals/Pain Pain Assessment: 0-10 Pain Score: 6  Pain Location: Rt hip Pain Descriptors / Indicators: Aching Pain Intervention(s): Repositioned    Home Living Family/patient expects to be discharged to:: Private residence Living Arrangements: Alone Available Help at Discharge: Family;Available 24 hours/day (Mother is staying with patient) Type of Home: House ([place]) Home Access: Stairs to enter Entrance Stairs-Rails: None Entrance Stairs-Number of Steps: 1 Home Layout: Two level;Bed/bath upstairs;1/2 bath on main level (Can sleep on couch (wide) downstairs) Home Equipment: Walker - 2 wheels;Cane - single point      Prior Function Level of Independence: Independent               Hand Dominance        Extremity/Trunk Assessment   Upper Extremity Assessment: Overall WFL for tasks assessed           Lower Extremity Assessment: RLE deficits/detail RLE Deficits /  Details: Decreased strength post-op    Cervical / Trunk Assessment: Normal  Communication   Communication: No difficulties  Cognition Arousal/Alertness: Awake/alert Behavior During Therapy: Impulsive;Restless Overall Cognitive Status: Within Functional Limits for tasks assessed (Decreased safety awareness; unable to integrate precautions)       Memory: Decreased recall of precautions              General  Comments      Exercises Total Joint Exercises Ankle Circles/Pumps: AROM;Both;10 reps;Seated      Assessment/Plan    PT Assessment Patient needs continued PT services  PT Diagnosis Difficulty walking;Abnormality of gait;Acute pain   PT Problem List Decreased strength;Decreased range of motion;Decreased activity tolerance;Decreased balance;Decreased mobility;Decreased safety awareness;Decreased knowledge of precautions;Pain  PT Treatment Interventions DME instruction;Gait training;Stair training;Functional mobility training;Therapeutic activities;Therapeutic exercise;Patient/family education   PT Goals (Current goals can be found in the Care Plan section) Acute Rehab PT Goals Patient Stated Goal: To move to cane PT Goal Formulation: With patient Time For Goal Achievement: 08/22/14 Potential to Achieve Goals: Good    Frequency 7X/week   Barriers to discharge Decreased caregiver support Patient lives alone.  Mother to stay with patient for 24/7 assist.    Co-evaluation               End of Session Equipment Utilized During Treatment: Gait belt Activity Tolerance: Patient tolerated treatment well;Patient limited by pain Patient left: in bed;with call bell/phone within reach;with nursing/sitter in room Nurse Communication: Mobility status         Time: 0350-0938 PT Time Calculation (min) (ACUTE ONLY): 17 min   Charges:   PT Evaluation $Initial PT Evaluation Tier I: 1 Procedure PT Treatments $Gait Training: 8-22 mins   PT G CodesDespina Pole 08/15/2014, 7:45 PM Carita Pian. Sanjuana Kava, [place] Pager [phone]

## 2014-08-15 NOTE — Anesthesia Procedure Notes (Addendum)
Procedure Name: Intubation Date/Time: 08/15/2014 12:24 PM Performed by: [facility] Pre-anesthesia Checklist: Patient identified, Emergency Drugs available, Suction available, Patient being monitored and Timeout performed Patient Re-evaluated:Patient Re-evaluated prior to inductionOxygen Delivery Method: Circle system utilized Preoxygenation: Pre-oxygenation with 100% oxygen Intubation Type: IV induction Ventilation: Mask ventilation without difficulty Laryngoscope Size: Mac and 4 Grade View: Grade II Tube type: Oral Tube size: 7.5 mm Number of attempts: 1 Airway Equipment and Method: Stylet Placement Confirmation: ETT inserted through vocal cords under direct vision,  positive ETCO2 and breath sounds checked- equal and bilateral Secured at: 21 cm Tube secured with: Tape Dental Injury: Teeth and Oropharynx as per pre-operative assessment  Comments: Intubation by Hubbard Robinson, SRNA

## 2014-08-15 NOTE — Progress Notes (Signed)
Report given to teresa rn as caregiver 

## 2014-08-15 NOTE — Plan of Care (Signed)
Problem: Phase I Progression Outcomes Goal: Dangle or out of bed evening of surgery Outcome: Completed/Met Date Met:  08/15/14  Problem: Phase II Progression Outcomes Goal: Ambulates Outcome: Completed/Met Date Met:  08/15/14     

## 2014-08-15 NOTE — Interval H&P Note (Signed)
History and Physical Interval Note:  08/15/2014 11:29 AM  Julie Turner  has presented today for surgery, with the diagnosis of RIGHT HIP ARTHRITIS  The various methods of treatment have been discussed with the patient and family. After consideration of risks, benefits and other options for treatment, the patient has consented to  Procedure(s): RIGHT TOTAL HIP ARTHROPLASTY (Right) as a surgical intervention .  The patient's history has been reviewed, patient examined, no change in status, stable for surgery.  I have reviewed the patient's chart and labs.  Questions were answered to the patient's satisfaction.     Kerin Salen

## 2014-08-16 ENCOUNTER — Encounter (HOSPITAL_COMMUNITY): Payer: Self-pay | Admitting: Orthopedic Surgery

## 2014-08-16 LAB — CBC
HCT: 32.7 % — ABNORMAL LOW (ref 36.0–46.0)
Hemoglobin: 10.7 g/dL — ABNORMAL LOW (ref 12.0–15.0)
MCH: 30.8 pg (ref 26.0–34.0)
MCHC: 32.7 g/dL (ref 30.0–36.0)
MCV: 94.2 fL (ref 78.0–100.0)
PLATELETS: 290 10*3/uL (ref 150–400)
RBC: 3.47 MIL/uL — ABNORMAL LOW (ref 3.87–5.11)
RDW: 13.3 % (ref 11.5–15.5)
WBC: 10.7 10*3/uL — AB (ref 4.0–10.5)

## 2014-08-16 LAB — BASIC METABOLIC PANEL
ANION GAP: 14 (ref 5–15)
BUN: 13 mg/dL (ref 6–23)
CO2: 25 mEq/L (ref 19–32)
Calcium: 9.3 mg/dL (ref 8.4–10.5)
Chloride: 102 mEq/L (ref 96–112)
Creatinine, Ser: 0.72 mg/dL (ref 0.50–1.10)
GFR calc non Af Amer: >90 mL/min (ref >90)
Glucose, Bld: 130 mg/dL — ABNORMAL HIGH (ref 70–99)
Potassium: 3.8 mEq/L (ref 3.7–5.3)
Sodium: 141 mEq/L (ref 137–147)

## 2014-08-16 MED ORDER — TIZANIDINE HCL 4 MG PO TABS
4.0000 mg | ORAL_TABLET | Freq: Four times a day (QID) | ORAL | Status: DC | PRN
  Administered 2014-08-16: 4 mg via ORAL
  Filled 2014-08-16 (×2): qty 1

## 2014-08-16 MED ORDER — HYDROCHLOROTHIAZIDE 12.5 MG PO CAPS
12.5000 mg | ORAL_CAPSULE | Freq: Every day | ORAL | Status: DC
  Administered 2014-08-16: 12.5 mg via ORAL
  Filled 2014-08-16: qty 1

## 2014-08-16 MED ORDER — HYDROCODONE-ACETAMINOPHEN 10-325 MG PO TABS
1.0000 | ORAL_TABLET | ORAL | Status: DC | PRN
  Administered 2014-08-16: 1 via ORAL
  Filled 2014-08-16: qty 1

## 2014-08-16 MED ORDER — CARISOPRODOL 350 MG PO TABS: 350.0000 mg | ORAL_TABLET | Freq: Every morning | ORAL | Status: DC

## 2014-08-16 NOTE — Progress Notes (Deleted)
CARE MANAGEMENT NOTE 08/16/2014  Patient:  Julie Turner   Account Number:  [id number]  Date Initiated:  08/13/2014  Documentation initiated by:  ADDISON,LAWANDA  Subjective/Objective Assessment:   58 yr old woman, s/p I +D of rt hip w/ revision of cup and abt bead placement.  Has rolling walker.     Action/Plan:   Pt to get PICC line for longterm antibiotics.  Will need shortterm rehab has no home support   Anticipated DC Date:  08/16/2014   Anticipated DC Plan:  [place]  In-house referral  Clinical Social Worker         Choice offered to / List presented to:             Status of service:  Completed, signed off Medicare Important Message given?  YES (If response is "NO", the following Medicare IM given date fields will be blank) Date Medicare IM given:  08/16/2014 Medicare IM given by:  [facility] Date Additional Medicare IM given:   Additional Medicare IM given by:    Discharge Disposition:  [place]

## 2014-08-16 NOTE — Plan of Care (Signed)
Problem: Phase III Progression Outcomes Goal: Incision clean - minimal/no drainage Outcome: Completed/Met Date Met:  08/16/14

## 2014-08-16 NOTE — Progress Notes (Signed)
CARE MANAGEMENT NOTE 08/16/2014  Patient:  Julie Turner   Account Number:  [id number]  Date Initiated:  08/16/2014  Documentation initiated by:  [facility]  Subjective/Objective Assessment:   rt hip arthroplasty     Action/Plan:   PT/OT evals- recommended HHPT   Anticipated DC Date:  08/16/2014   Anticipated DC Plan:  [place]  CM consult      Choice offered to / List presented to:  C-1 Patient   DME arranged  3-N-1  [place]      DME agency  [place] arranged  [name].   Status of service:  Completed, signed off Medicare Important Message given?   (If response is "NO", the following Medicare IM given date fields will be blank) Date Medicare IM given:   Medicare IM given by:   Date Additional Medicare IM given:   Additional Medicare IM given by:    Discharge Disposition:  [place]  Per UR Regulation:    If discussed at Long Length of Stay Meetings, dates discussed:    Comments:  08/16/14 Spoke with patient about HHC, she chose [facility]. Contacted Miranda at [facility] and set up [place]. Rolling walker, 3N1 and tub bench delivered to patient by [facility] prior to discharge. Fuller Plan RN, BSN, CCM

## 2014-08-16 NOTE — Progress Notes (Signed)
Utilization review completed.  

## 2014-08-16 NOTE — Progress Notes (Signed)
Occupational Therapy Evaluation Patient Details Name: Julie Turner MRN: [id number] DOB: 1956-01-12 Today's Date: 08/16/2014    History of Present Illness Patient is a 58 yo female admitted 08/15/14 now s/p Rt THA (posterior precautions).  PMH:  spinal headaches, HTN, CVA, anxiety, depression, arthritis, breast CA, tobacco abuse   Clinical Impression   PTA pt lived at home and was independent with ADLs, however she reports difficulty with dressing RLE due to pain and states that she was "crafty" with adaptations. Pt is moving well, however demonstrates decreased safety awareness, decreased recall of precautions, and impulsivity. Pt would benefit from acute OT to address safety concerns with pt d/c home.     Follow Up Recommendations  No OT follow up;Supervision/Assistance - 24 hour    Equipment Recommendations  3 in 1 bedside comode;Tub/shower bench    Recommendations for Other Services       Precautions / Restrictions Precautions Precautions: Posterior Hip Precaution Booklet Issued: Yes (comment) Precaution Comments: Reviewed posterior hip precautions with patient extensively. Pt able to recall 2/3 precautions when asked during session.  Restrictions Weight Bearing Restrictions: Yes RLE Weight Bearing: Weight bearing as tolerated      Mobility Bed Mobility Overal bed mobility: Needs Assistance Bed Mobility: Supine to Sit;Sit to Supine     Supine to sit: Supervision Sit to supine: Supervision   General bed mobility comments: Cues for safety and to maintain precautions  Transfers Overall transfer level: Needs assistance Equipment used: Rolling walker (2 wheeled) Transfers: Sit to/from Stand Sit to Stand: Supervision         General transfer comment: Supervision for safety.          ADL Overall ADL's : Needs assistance/impaired Eating/Feeding: Independent;Sitting   Grooming: Supervision/safety;Standing   Upper Body Bathing: Set up;Sitting   Lower Body  Bathing: Set up;Supervison/ safety;Sit to/from stand;With adaptive equipment   Upper Body Dressing : Set up;Sitting   Lower Body Dressing: Minimal assistance;Sit to/from stand;With adaptive equipment Lower Body Dressing Details (indicate cue type and reason): pt doffed and donned socks using reacher and sock aid Toilet Transfer: Supervision/safety;Ambulation;RW (BSC over toilet)   Toileting- Clothing Manipulation and Hygiene: Supervision/safety;Sit to/from stand       Functional mobility during ADLs: Supervision/safety;Rolling walker General ADL Comments: Overall supervision due to impulsivity, decreased safety awareness, and decreased recall of precautions. Reviewed precautions x3 during session with pt still unable to recall 3/3 independently.      Vision  Pt reports no change from baseline.                    Perception Perception Perception Tested?: No   Praxis Praxis Praxis tested?: Within functional limits    Pertinent Vitals/Pain Pain Assessment: 0-10 Pain Score: 6  Pain Location: Rt Hip Pain Descriptors / Indicators: Aching;Sore Pain Intervention(s): Monitored during session;Repositioned     Hand Dominance     Extremity/Trunk Assessment Upper Extremity Assessment Upper Extremity Assessment: Overall WFL for tasks assessed   Lower Extremity Assessment Lower Extremity Assessment: Defer to PT evaluation   Cervical / Trunk Assessment Cervical / Trunk Assessment: Normal   Communication Communication Communication: No difficulties   Cognition Arousal/Alertness: Awake/alert Behavior During Therapy: Impulsive Overall Cognitive Status: Within Functional Limits for tasks assessed       Memory: Decreased recall of precautions                        Home Living Family/patient expects to be discharged to:: Private residence  Living Arrangements: Alone Available Help at Discharge: Family;Available 24 hours/day (Mother is staying with pt (23  y.o)) Type of Home: House ([place]house) Home Access: Stairs to enter Technical brewer of Steps: 1 Entrance Stairs-Rails: None Home Layout: Two level;Bed/bath upstairs;1/2 bath on main level (wide couch downstairs) Alternate Level Stairs-Number of Steps: flight Alternate Level Stairs-Rails: Right           Home Equipment: Walker - 2 wheels;Cane - single point          Prior Functioning/Environment Level of Independence: Independent             OT Diagnosis: Generalized weakness;Acute pain   OT Problem List: Decreased strength;Decreased range of motion;Decreased activity tolerance;Impaired balance (sitting and/or standing);Decreased safety awareness;Decreased knowledge of use of DME or AE;Decreased knowledge of precautions;Pain   OT Treatment/Interventions: Self-care/ADL training;Therapeutic exercise;Energy conservation;DME and/or AE instruction;Therapeutic activities;Patient/family education;Balance training    OT Goals(Current goals can be found in the care plan section) Acute Rehab OT Goals Patient Stated Goal: to take a nap OT Goal Formulation: With patient Time For Goal Achievement: 08/30/14 Potential to Achieve Goals: Good ADL Goals Pt Will Perform Grooming: with modified independence;standing (safely. ) Pt Will Perform Lower Body Bathing: with modified independence;with adaptive equipment;sit to/from stand (safely. ) Pt Will Perform Lower Body Dressing: with modified independence;with adaptive equipment;sit to/from stand Pt Will Transfer to Toilet: with modified independence;ambulating Pt Will Perform Toileting - Clothing Manipulation and hygiene: with modified independence;sit to/from stand Pt Will Perform Tub/Shower Transfer: with modified independence;ambulating;rolling walker Additional ADL Goal #1: Pt will recall 3/3 posterior hip precautions independently to promote safety with ADLs at home.   OT Frequency: Min 2X/week    End of Session Equipment  Utilized During Treatment: Rolling walker  Activity Tolerance: Patient tolerated treatment well Patient left: in bed;with call bell/phone within reach   Time: 1000-1024 OT Time Calculation (min): 24 min Charges:  OT General Charges $OT Visit: 1 Procedure OT Evaluation $Initial OT Evaluation Tier I: 1 Procedure OT Treatments $Self Care/Home Management : 8-22 mins  Juluis Rainier 08/16/2014, 10:36 AM   Cyndie Chime, OTR/L Occupational Therapist [phone] (pager)

## 2014-08-16 NOTE — Plan of Care (Signed)
Problem: Phase II Progression Outcomes Goal: Tolerating diet Outcome: Completed/Met Date Met:  08/16/14     

## 2014-08-16 NOTE — Plan of Care (Signed)
Problem: Phase I Progression Outcomes Goal: Pain controlled with appropriate interventions Outcome: Completed/Met Date Met:  08/16/14 Goal: Hemodynamically stable Outcome: Completed/Met Date Met:  08/16/14

## 2014-08-16 NOTE — Plan of Care (Signed)
Problem: Phase I Progression Outcomes Goal: CMS/Neurovascular status WDL Outcome: Completed/Met Date Met:  08/16/14 Goal: Pain controlled with appropriate interventions Outcome: Progressing

## 2014-08-16 NOTE — Plan of Care (Signed)
Problem: Phase I Progression Outcomes Goal: Initial discharge plan identified Outcome: Completed/Met Date Met:  08/16/14 Goal: Other Phase I Outcomes/Goals Outcome: Completed/Met Date Met:  08/16/14  Problem: Phase II Progression Outcomes Goal: Discharge plan established Outcome: Completed/Met Date Met:  08/16/14 Goal: Other Phase II Outcomes/Goals Outcome: Completed/Met Date Met:  08/16/14  Problem: Phase III Progression Outcomes Goal: Pain controlled on oral analgesia Outcome: Completed/Met Date Met:  08/16/14 Goal: Ambulates Outcome: Completed/Met Date Met:  08/16/14 Goal: Discharge plan remains appropriate-arrangements made Outcome: Completed/Met Date Met:  08/16/14 Goal: Anticoagulant follow-up in place Outcome: Completed/Met Date Met:  08/16/14 Goal: Other Phase III Outcomes/Goals Outcome: Completed/Met Date Met:  08/16/14  Problem: Discharge Progression Outcomes Goal: Barriers To Progression Addressed/Resolved Outcome: Completed/Met Date Met:  08/16/14 Goal: CMS/Neurovascular status at or above baseline Outcome: Completed/Met Date Met:  08/16/14 Goal: Anticoagulant follow-up in place Outcome: Completed/Met Date Met:  08/16/14 Goal: Pain controlled with appropriate interventions Outcome: Completed/Met Date Met:  08/16/14 Goal: Hemodynamically stable Outcome: Completed/Met Date Met:  35/39/12 Goal: Complications resolved/controlled Outcome: Completed/Met Date Met:  08/16/14 Goal: Tolerates diet Outcome: Completed/Met Date Met:  08/16/14 Goal: Activity appropriate for discharge plan Outcome: Completed/Met Date Met:  08/16/14 Goal: Ambulates safely using assistive device Outcome: Completed/Met Date Met:  08/16/14 Goal: Follows weight - bearing limitations Outcome: Completed/Met Date Met:  08/16/14 Goal: Discharge plan in place and appropriate Outcome: Completed/Met Date Met:  08/16/14 Goal: Negotiates stairs Outcome: Completed/Met Date Met:  08/16/14 Goal:  Demonstrates ADLs as appropriate Outcome: Completed/Met Date Met:  08/16/14 Goal: Incision without S/S infection Outcome: Completed/Met Date Met:  08/16/14 Goal: Other Discharge Outcomes/Goals Outcome: Completed/Met Date Met:  08/16/14

## 2014-08-16 NOTE — Discharge Summary (Signed)
Patient ID: Myangel Summons MRN: [id number] DOB/AGE: [birth year] 58 y.o.  Admit date: 08/15/2014 Discharge date: 08/16/2014  Admission Diagnoses:  Active Problems:   Arthritis of right hip   Discharge Diagnoses:  Same  Past Medical History  Diagnosis Date  . Anxiety   . Depression   . GERD (gastroesophageal reflux disease)   . Numbness and tingling in left hand   . Hypertension     "just when I had radiation in 2001"  . Breast cancer, right breast 2001    S/P lumpecbomy and radiation  . High cholesterol   . Spinal headache   . Migraine     "when I don't take my ASA qd" (08/15/2014)  . Stroke ~ 2013    "silent stroke" per her neurologist  . Arthritis     "all my joints"  . Fibromyalgia     Surgeries: Procedure(s): RIGHT TOTAL HIP ARTHROPLASTY on 08/15/2014   Consultants:    Discharged Condition: Improved  Hospital Course: Zeena Starkel is an 58 y.o. female who was admitted 08/15/2014 for operative treatment of<principal problem not specified>. Patient has severe unremitting pain that affects sleep, daily activities, and work/hobbies. After pre-op clearance the patient was taken to the operating room on 08/15/2014 and underwent  Procedure(s): RIGHT TOTAL HIP ARTHROPLASTY.    Patient was given perioperative antibiotics: Anti-infectives    Start     Dose/Rate Route Frequency Ordered Stop   08/15/14 2000  ciprofloxacin (CIPRO) tablet 500 mg    Comments:  Pt is to take this for 7 days for preop UTI.   500 mg Oral 2 times daily 08/15/14 1654 08/22/14 0759   08/15/14 0600  ceFAZolin (ANCEF) IVPB 2 g/50 mL premix     2 g100 mL/hr over 30 Minutes Intravenous On call to O.R. 08/14/14 1311 08/15/14 1240       Patient was given sequential compression devices, early ambulation, and chemoprophylaxis to prevent DVT.  Patient benefited maximally from hospital stay and there were no complications.    Recent vital signs: Patient Vitals for the past 24 hrs:  BP Temp Temp src Pulse Resp  SpO2  08/16/14 1200 - - - - 18 93 %  08/16/14 0800 - - - - 18 93 %  08/16/14 0533 102/62 mmHg 99 F (37.2 C) Oral (!) 104 - 93 %  08/16/14 0153 (!) 101/59 mmHg 99 F (37.2 C) Oral 99 - 94 %  08/16/14 0000 - - - - 18 95 %  08/15/14 2041 (!) 91/44 mmHg 97.8 F (36.6 C) Oral 77 - 94 %  08/15/14 2000 - - - - 18 96 %  08/15/14 1656 138/66 mmHg 97.7 F (36.5 C) - 67 15 98 %  08/15/14 1600 - - - 77 13 94 %     Recent laboratory studies:  Recent Labs  08/16/14 0554  WBC 10.7*  HGB 10.7*  HCT 32.7*  PLT 290  NA 141  K 3.8  CL 102  CO2 25  BUN 13  CREATININE 0.72  GLUCOSE 130*  CALCIUM 9.3     Discharge Medications:     Medication List    STOP taking these medications        aspirin 81 MG tablet  Replaced by:  aspirin EC 325 MG tablet     carisoprodol 350 MG tablet  Commonly known as:  SOMA     HYDROcodone-acetaminophen 10-325 MG per tablet  Commonly known as:  NORCO     ZANAFLEX 4 MG tablet  Generic  drug:  tiZANidine      TAKE these medications        aspirin EC 325 MG tablet  Take 1 tablet (325 mg total) by mouth 2 (two) times daily.     ciprofloxacin 500 MG tablet  Commonly known as:  CIPRO  Take 500 mg by mouth 2 (two) times daily. For 7 days     CLARITIN 10 MG tablet  Generic drug:  loratadine  Take 10 mg by mouth as needed for allergies.     co-enzyme Q-10 30 MG capsule  Take 300 mg by mouth daily.     CYMBALTA 60 MG capsule  Generic drug:  DULoxetine  Take 60 mg by mouth daily.     Fish Oil 1000 MG Caps  Take 2,000 mg by mouth every morning. 2,000 mg daily     hydrochlorothiazide 12.5 MG tablet  Commonly known as:  HYDRODIURIL  Take 12.5 mg by mouth daily.     ibuprofen 400 MG tablet  Commonly known as:  ADVIL,MOTRIN  Take 400 mg by mouth every 6 (six) hours as needed for pain.     methocarbamol 500 MG tablet  Commonly known as:  ROBAXIN  Take 1 tablet (500 mg total) by mouth 2 (two) times daily with a meal.     MULTIVITAMIN PO   Take by mouth every morning.     oxyCODONE-acetaminophen 5-325 MG per tablet  Commonly known as:  ROXICET  Take 1 tablet by mouth every 4 (four) hours as needed.     PRILOSEC 20 MG capsule  Generic drug:  omeprazole  Take 20 mg by mouth daily.     propranolol ER 80 MG 24 hr capsule  Commonly known as:  INDERAL LA  Take 80 mg by mouth daily.     SAVELLA 50 MG Tabs tablet  Generic drug:  Milnacipran  Take 50 mg by mouth 2 (two) times daily.     UNABLE TO FIND  Place into both nostrils as needed. MONASE 50 mcg/act  NASEL SPRAY     UNABLE TO FIND  Take by mouth at bedtime. AMHIEN 10 ng.     Vitamin D (Ergocalciferol) 50000 UNITS Caps capsule  Commonly known as:  DRISDOL  Take 50,000 Units by mouth every 7 (seven) days.     XANAX 1 MG tablet  Generic drug:  ALPRAZolam  Take 1 mg by mouth as needed for sleep.        Diagnostic Studies: Dg Pelvis Portable  08/15/2014   CLINICAL DATA:  Status post right hip arthroplasty  EXAM: PORTABLE PELVIS 1-2 VIEWS  COMPARISON:  06/11/2014 MRI  FINDINGS: Patient is status post total right hip arthroplasty. Components appear aligned in the frontal plane. Expected changes of the soft tissues. No acute osseous or hardware abnormality.  IMPRESSION: Status post right hip arthroplasty.  No complicating feature.   Electronically Signed   By: Daryll Brod M.D.   On: 08/15/2014 17:53    Disposition: Final discharge disposition not confirmed      Discharge Instructions    Call MD / Call 911    Complete by:  As directed   If you experience chest pain or shortness of breath, CALL 911 and be transported to the hospital emergency room.  If you develope a fever above 101 F, pus (white drainage) or increased drainage or redness at the wound, or calf pain, call your surgeon's office.     Change dressing    Complete by:  As  directed   You may change your dressing on day 5, then change the dressing daily with sterile 4 x 4 inch gauze dressing and paper  tape.  You may clean the incision with alcohol prior to redressing     Constipation Prevention    Complete by:  As directed   Drink plenty of fluids.  Prune juice may be helpful.  You may use a stool softener, such as Colace (over the counter) 100 mg twice a day.  Use MiraLax (over the counter) for constipation as needed.     Diet - low sodium heart healthy    Complete by:  As directed      Discharge instructions    Complete by:  As directed   Follow up in office with Dr. Mayer Camel in 2 weeks.     Driving restrictions    Complete by:  As directed   No driving for 2 weeks     Follow the hip precautions as taught in Physical Therapy    Complete by:  As directed      Increase activity slowly as tolerated    Complete by:  As directed      Patient may shower    Complete by:  As directed   You may shower without a dressing once there is no drainage.  Do not wash over the wound.  If drainage remains, cover wound with plastic wrap and then shower.           Follow-up Information    Follow up with Kerin Salen, MD In 2 weeks.   Specialty:  Orthopedic Surgery   Contact information:   [place] [address] [phone]        Signed: Theodosia Quay 08/16/2014, 3:31 PM

## 2014-08-16 NOTE — Progress Notes (Signed)
Physical Therapy Treatment Patient Details Name: Julie Turner MRN: [id number] DOB: 07/28/1956 Today's Date: 08/16/2014    History of Present Illness Patient is a 58 yo female admitted 08/15/14 now s/p Rt THA (posterior precautions).  PMH:  spinal headaches, HTN, CVA, anxiety, depression, arthritis, breast CA, tobacco abuse    PT Comments    Patient progressing with mobility and stairs. Patient continues to be impulsive with decreased safety awareness. She is planning to Dc home with her mother later today. Patient safe to D/C from a mobility standpoint based on progression towards goals set on PT eval.    Follow Up Recommendations  Home health PT;Supervision/Assistance - 24 hour     Equipment Recommendations  3in1 (PT)    Recommendations for Other Services       Precautions / Restrictions Precautions Precautions: Posterior Hip Precaution Comments: Reviewed posterior hip precautions with patient. Restrictions RLE Weight Bearing: Weight bearing as tolerated    Mobility  Bed Mobility         Supine to sit: Supervision Sit to supine: Supervision   General bed mobility comments: Cues for safety and to maintain precautions  Transfers Overall transfer level: Needs assistance Equipment used: Rolling walker (2 wheeled)   Sit to Stand: Supervision         General transfer comment: Cues for hand placement  Ambulation/Gait Ambulation/Gait assistance: Min guard Ambulation Distance (Feet): 300 Feet Assistive device: Rolling walker (2 wheeled) Gait Pattern/deviations: Step-through pattern;Decreased stride length     General Gait Details: Verbal cues for safe use of RW.  Required mult cues to slow to safe pace.  Minguard for safety   Stairs Stairs: Yes   Stair Management: Step to pattern;Alternating pattern;Two rails;Forwards Number of Stairs: 5 General stair comments: Practiced x5. Cues for technique/safety. Patient attempting step through pattern although educated  on step to  Wheelchair Mobility    Modified Rankin (Stroke Patients Only)       Balance                                    Cognition Arousal/Alertness: Awake/alert Behavior During Therapy: Impulsive Overall Cognitive Status: Within Functional Limits for tasks assessed (decreased safety awareness)       Memory: Decreased recall of precautions              Exercises Total Joint Exercises Quad Sets: AROM;Right;10 reps Gluteal Sets: AROM;Both;10 reps Heel Slides: Right;10 reps;AROM Hip ABduction/ADduction: AAROM;Right;10 reps    General Comments        Pertinent Vitals/Pain Pain Score: 6  Pain Location: RT HIP Pain Descriptors / Indicators: Aching;Sore Pain Intervention(s): Monitored during session    Home Living                      Prior Function            PT Goals (current goals can now be found in the care plan section) Progress towards PT goals: Progressing toward goals    Frequency  7X/week    PT Plan Current plan remains appropriate    Co-evaluation             End of Session Equipment Utilized During Treatment: Gait belt Activity Tolerance: Patient tolerated treatment well Patient left: in bed;with call bell/phone within reach     Time: 0850-0914 PT Time Calculation (min) (ACUTE ONLY): 24 min  Charges:  $Gait Training: 8-22 mins $Therapeutic Exercise:  8-22 mins                    G Codes:      Jacqualyn Posey 08/16/2014, 9:23 AM 08/16/2014 Jacqualyn Posey PTA [phone] pager [phone] office

## 2014-08-16 NOTE — Progress Notes (Signed)
Patient ID: Julie Turner, female   DOB: January 05, 1956, 58 y.o.   MRN: [id number] PATIENT ID: Julie Turner  MRN: [id number]  DOB/AGE:  Dec 22, 1955 / 58 y.o.  1 Day Post-Op Procedure(s) (LRB): RIGHT TOTAL HIP ARTHROPLASTY (Right)    PROGRESS NOTE Subjective: Patient is alert, oriented, no Nausea, no Vomiting, yes passing gas, no Bowel Movement. Taking PO sips. Denies SOB, Chest or Calf Pain. Using Incentive Spirometer, PAS in place. Ambulate Walked in the room.  Yesterday Patient reports pain as 8 on 0-10 scale  .    Objective: Vital signs in last 24 hours: Filed Vitals:   08/15/14 2041 08/16/14 0000 08/16/14 0153 08/16/14 0533  BP: 91/44  101/59 102/62  Pulse: 77  99 104  Temp: 97.8 F (36.6 C)  99 F (37.2 C) 99 F (37.2 C)  TempSrc: Oral  Oral Oral  Resp:  18    Weight:      SpO2: 94% 95% 94% 93%      Intake/Output from previous day: I/O last 3 completed shifts: In: 3363.3 [P.O.:480; I.V.:2883.3] Out: 200 [Blood:200]   Intake/Output this shift:     LABORATORY DATA:  Recent Labs  08/16/14 0554  WBC 10.7*  HGB 10.7*  HCT 32.7*  PLT 290  NA 141  K 3.8  CL 102  CO2 25  BUN 13  CREATININE 0.72  GLUCOSE 130*  CALCIUM 9.3    Examination: Neurologically intact ABD soft Neurovascular intact Sensation intact distally Intact pulses distally Dorsiflexion/Plantar flexion intact Incision: dressing C/D/I No cellulitis present Compartment soft} XR AP&Lat of hip shows well placed\fixed THA  Assessment:   1 Day Post-Op Procedure(s) (LRB): RIGHT TOTAL HIP ARTHROPLASTY (Right) ADDITIONAL DIAGNOSIS:  Expected Acute Blood Loss Anemia, Anxiety, history of UTI, requiring use of CIPROFLOXACIN, current smoker, one half pack a day  Plan: PT/OT WBAT, THA  posterior precautions  DVT Prophylaxis: SCDx72 hrs, ASA 325 mg BID x 2 weeks  DISCHARGE PLAN: Home,1 patient passes physical therapy,   DISCHARGE NEEDS: HHPT, Walker and 3-in-1 comode seat

## 2014-08-18 LAB — TYPE AND SCREEN
ABO/RH(D): AB POS
Antibody Screen: NEGATIVE
UNIT DIVISION: 0
Unit division: 0

## 2014-10-06 ENCOUNTER — Other Ambulatory Visit: Payer: Self-pay | Admitting: Family Medicine

## 2014-10-06 DIAGNOSIS — R109 Unspecified abdominal pain: Secondary | ICD-10-CM

## 2016-09-20 ENCOUNTER — Other Ambulatory Visit (HOSPITAL_BASED_OUTPATIENT_CLINIC_OR_DEPARTMENT_OTHER): Payer: Self-pay

## 2016-09-20 DIAGNOSIS — R5383 Other fatigue: Secondary | ICD-10-CM

## 2016-09-20 DIAGNOSIS — G4710 Hypersomnia, unspecified: Secondary | ICD-10-CM

## 2016-09-20 DIAGNOSIS — R0683 Snoring: Secondary | ICD-10-CM

## 2016-09-20 DIAGNOSIS — R454 Irritability and anger: Secondary | ICD-10-CM

## 2016-10-24 ENCOUNTER — Ambulatory Visit (HOSPITAL_BASED_OUTPATIENT_CLINIC_OR_DEPARTMENT_OTHER): Payer: Medicaid Other | Attending: Family Medicine | Admitting: Internal Medicine

## 2016-10-24 VITALS — Ht 66.0 in | Wt 170.0 lb

## 2016-10-24 DIAGNOSIS — G4700 Insomnia, unspecified: Secondary | ICD-10-CM | POA: Insufficient documentation

## 2016-10-24 DIAGNOSIS — G2581 Restless legs syndrome: Secondary | ICD-10-CM | POA: Diagnosis not present

## 2016-10-24 DIAGNOSIS — R51 Headache: Secondary | ICD-10-CM | POA: Diagnosis not present

## 2016-10-24 DIAGNOSIS — R0683 Snoring: Secondary | ICD-10-CM

## 2016-10-24 DIAGNOSIS — R5383 Other fatigue: Secondary | ICD-10-CM | POA: Insufficient documentation

## 2016-10-24 DIAGNOSIS — F329 Major depressive disorder, single episode, unspecified: Secondary | ICD-10-CM | POA: Diagnosis not present

## 2016-10-24 DIAGNOSIS — I10 Essential (primary) hypertension: Secondary | ICD-10-CM | POA: Insufficient documentation

## 2016-10-24 DIAGNOSIS — G4733 Obstructive sleep apnea (adult) (pediatric): Secondary | ICD-10-CM | POA: Diagnosis not present

## 2016-10-24 DIAGNOSIS — G4710 Hypersomnia, unspecified: Secondary | ICD-10-CM

## 2016-10-24 DIAGNOSIS — R454 Irritability and anger: Secondary | ICD-10-CM

## 2016-10-24 DIAGNOSIS — Z79899 Other long term (current) drug therapy: Secondary | ICD-10-CM | POA: Diagnosis not present

## 2016-11-02 DIAGNOSIS — R0683 Snoring: Secondary | ICD-10-CM

## 2016-11-02 NOTE — Procedures (Signed)
  Patient Name: Julie Turner, Julie Turner Date: 10/24/2016 Gender: Female D.O.B: 01/14/1956 Age (years): 60 Referring Provider: Carlos Levering Height (inches): 61 Interpreting Physician: Baird Lyons MD, ABSM Weight (lbs): 170 RPSGT: Laren Everts BMI: 27 MRN: [id number] Neck Size: 16.00 CLINICAL INFORMATION Sleep Study Type: NPSG  Indication for sleep study: Daytime Fatigue, Depression, Fatigue, Hypertension, Insomnia, Morning Headaches, Non-refreshing Sleep, Restless Sleep with Limb Movments, Snoring  Epworth Sleepiness Score: 5  SLEEP STUDY TECHNIQUE As per the AASM Manual for the Scoring of Sleep and Associated Events v2.3 (April 2016) with a hypopnea requiring 4% desaturations.  The channels recorded and monitored were frontal, central and occipital EEG, electrooculogram (EOG), submentalis EMG (chin), nasal and oral airflow, thoracic and abdominal wall motion, anterior tibialis EMG, snore microphone, electrocardiogram, and pulse oximetry.  MEDICATIONS Medications self-administered by patient taken the night of the study : GABAPENTIN, AMITRIPTYLINE, MELATONIN, IBUPROFEN, XANAX, ZANAFLEX  SLEEP ARCHITECTURE The study was initiated at 10:45:19 PM and ended at 5:22:18 AM.  Sleep onset time was 102.5 minutes and the sleep efficiency was 72.3%. The total sleep time was 287.0 minutes.  Stage REM latency was 241.0 minutes.  The patient spent 5.92% of the night in stage N1 sleep, 88.15% in stage N2 sleep, 0.00% in stage N3 and 5.92% in REM.  Alpha intrusion was absent.  Supine sleep was 65.20%.  RESPIRATORY PARAMETERS The overall apnea/hypopnea index (AHI) was 8.4 per hour. There were 2 total apneas, including 2 obstructive, 0 central and 0 mixed apneas. There were 38 hypopneas and 9 RERAs.  The AHI during Stage REM sleep was 56.5 per hour.  AHI while supine was 10.6 per hour.  The mean oxygen saturation was 91.30%. The minimum SpO2 during sleep was 82.00%.  Moderate  snoring was noted during this study.  CARDIAC DATA The 2 lead EKG demonstrated sinus rhythm. The mean heart rate was 71.93 beats per minute. Other EKG findings include: None.  LEG MOVEMENT DATA The total PLMS were 2 with a resulting PLMS index of 0.42. Associated arousal with leg movement index was 0.2 .  IMPRESSIONS - Mild obstructive sleep apnea occurred during this study (AHI = 8.4/h). - No significant central sleep apnea occurred during this study (CAI = 0.0/h). - Mild oxygen desaturation was noted during this study (Min O2 = 82.00%). - The patient snored with Moderate snoring volume. - No cardiac abnormalities were noted during this study. - Clinically significant periodic limb movements did not occur during sleep. No significant associated arousals  DIAGNOSIS - Obstructive Sleep Apnea (327.23 [G47.33 ICD-10])  RECOMMENDATIONS - Therapeutic CPAP titration, or alternatives such as fitted oral appliance, based on clinical judgment. - Positional therapy avoiding supine position during sleep. - Note that patient took multiple sedating medications, including repeat doses of gabapentin, amitriptyline, and alprazolam. - Sleep hygiene should be reviewed to assess factors that may improve sleep quality. - Weight management and regular exercise should be initiated or continued if appropriate.  [Electronically signed] 11/02/2016 03:56 PM  Baird Lyons MD, [place], American Board of Sleep Medicine   NPI: [id number]  [place], American Board of Sleep Medicine  ELECTRONICALLY SIGNED ON:  11/02/2016, 3:51 PM [place] PH: [phone]   FX: [phone] [place]

## 2016-12-31 ENCOUNTER — Other Ambulatory Visit: Payer: Self-pay | Admitting: Family Medicine

## 2016-12-31 DIAGNOSIS — Z1231 Encounter for screening mammogram for malignant neoplasm of breast: Secondary | ICD-10-CM

## 2019-01-15 ENCOUNTER — Encounter: Payer: Self-pay | Admitting: Cardiology

## 2019-01-18 ENCOUNTER — Other Ambulatory Visit: Payer: Self-pay

## 2019-01-18 ENCOUNTER — Encounter: Payer: Self-pay | Admitting: Cardiology

## 2019-01-18 ENCOUNTER — Ambulatory Visit: Payer: Medicaid Other | Admitting: Cardiology

## 2019-01-18 VITALS — BP 121/90 | Ht 66.0 in | Wt 200.0 lb

## 2019-01-18 DIAGNOSIS — R252 Cramp and spasm: Secondary | ICD-10-CM | POA: Diagnosis not present

## 2019-01-18 DIAGNOSIS — R6889 Other general symptoms and signs: Secondary | ICD-10-CM

## 2019-01-18 NOTE — Progress Notes (Signed)
Virtual Visit via Video Note   Subjective:   Julie Turner, female    DOB: [birth year], 63 y.o.   MRN: [id number]   I connected with the patient on 01/18/19 by a video enabled telemedicine application and verified that I am speaking with the correct person using two identifiers.     I discussed the limitations of evaluation and management by telemedicine and the availability of in person appointments. The patient expressed understanding and agreed to proceed.   This visit type was conducted due to national recommendations for restrictions regarding the COVID-19 Pandemic (e.g. social distancing).  This format is felt to be most appropriate for this patient at this time.  All issues noted in this document were discussed and addressed.  No physical exam was performed (except for noted visual exam findings with Tele health visits).  The patient has consented to conduct a Tele health visit and understands insurance will be billed.     Chief complaint:  Exertional dyspnea   HPI  63 y.o. Caucasian female with hypertension, hyperlipidemia, tobacco abuse, major depression.  Patient has multiple complaints today, including decreases stamina, upper and lower extremity cramps. Cramps are present both at rest and exertion. She performs Pilates, and treadmill walks, but has felt decreased exercise tolerance recently. She denies any chest pain, presyncope/syncope, palpitations. Patient lives alone. She has not worked since she got divorced. She states that she is very depressed, and recently been perturbed by her attempts to make sure her 44 y/o mother is not alone. Her depression and anxiety is managed by [facility]. She is trying to quit smoking.   Past Medical History:  Diagnosis Date  . Anxiety   . Arthritis    "all my joints"  . Breast cancer, right breast ([place]) 2001   S/P lumpecbomy and radiation  . Depression   . Fibromyalgia   . GERD (gastroesophageal reflux disease)   .  High cholesterol   . Hypertension    "just when I had radiation in 2001"  . Migraine    "when I don't take my ASA qd" (08/15/2014)  . Numbness and tingling in left hand   . Spinal headache   . Stroke [facility]) ~ 2013   "silent stroke" per her neurologist     Past Surgical History:  Procedure Laterality Date  . BREAST LUMPECTOMY Right 2001  . BREAST LUMPECTOMY Right    2nd time w/"all the lymph nodes under my arm"  . BREAST RECONSTRUCTION Bilateral   . CESAREAN SECTION  1987; 1988  . CHOLECYSTECTOMY  2001  . DILATION AND CURETTAGE OF UTERUS  2001  . FOOT SURGERY Left ~ 2012   joint cleaned out  . JOINT REPLACEMENT    . TOTAL HIP ARTHROPLASTY Right 08/15/2014  . TOTAL HIP ARTHROPLASTY Right 08/15/2014   Procedure: RIGHT TOTAL HIP ARTHROPLASTY;  Surgeon: Kerin Salen, MD;  Location: [place];  Service: Orthopedics;  Laterality: Right;     Social History   Socioeconomic History  . Marital status: Single    Spouse name: Not on file  . Number of children: 2  . Years of education: Not on file  . Highest education level: Not on file  Occupational History  . Not on file  Social Needs  . Financial resource strain: Not on file  . Food insecurity:    Worry: Not on file    Inability: Not on file  . Transportation needs:    Medical: Not on file  Non-medical: Not on file  Tobacco Use  . Smoking status: Current Every Day Smoker    Packs/day: 0.50    Years: 36.00    Pack years: 18.00    Types: Cigarettes  . Smokeless tobacco: Never Used  Substance and Sexual Activity  . Alcohol use: Yes    Comment: 08/15/2014 "a few times/year"  . Drug use: No  . Sexual activity: Never  Lifestyle  . Physical activity:    Days per week: Not on file    Minutes per session: Not on file  . Stress: Not on file  Relationships  . Social connections:    Talks on phone: Not on file    Gets together: Not on file    Attends religious service: Not on file    Active member of club or organization: Not  on file    Attends meetings of clubs or organizations: Not on file    Relationship status: Not on file  . Intimate partner violence:    Fear of current or ex partner: Not on file    Emotionally abused: Not on file    Physically abused: Not on file    Forced sexual activity: Not on file  Other Topics Concern  . Not on file  Social History Narrative  . Not on file     Family History  Problem Relation Age of Onset  . Hypertension Father      Current Outpatient Medications on File Prior to Visit  Medication Sig Dispense Refill  . ALPRAZolam (XANAX) 1 MG tablet Take 1 mg by mouth as needed for sleep.     . Brexpiprazole 0.25 MG TABS Take by mouth.    . carisoprodol (SOMA) 350 MG tablet Take 350 mg by mouth 4 (four) times daily as needed.    . DULoxetine (CYMBALTA) 60 MG capsule Take 60 mg by mouth daily.    Marland Kitchen gabapentin (NEURONTIN) 600 MG tablet Take 600 mg by mouth daily.    . hydrochlorothiazide (HYDRODIURIL) 12.5 MG tablet Take 12.5 mg by mouth daily.    Marland Kitchen loratadine (CLARITIN) 10 MG tablet Take 10 mg by mouth as needed for allergies.    Marland Kitchen propranolol (INDERAL) 10 MG tablet Take 10 mg by mouth daily.    Marland Kitchen UNABLE TO FIND Place into both nostrils as needed. MONASE 50 mcg/act  NASEL SPRAY     No current facility-administered medications on file prior to visit.     Cardiovascular studies:  None available.  Recent labs: 07/10/2018: Glucose 101. BUN/Cr 14/0.69, eGFR 87. Na/K 140/4.1. Rest of the CMP normal. H/H 1/46. MCV 93. Platelets 280.   Review of Systems  Constitution: Positive for malaise/fatigue. Negative for decreased appetite, weight gain and weight loss.  HENT: Negative for congestion.   Eyes: Negative for visual disturbance.  Cardiovascular: Positive for claudication and dyspnea on exertion. Negative for chest pain, leg swelling, palpitations and syncope.  Respiratory: Negative for cough.   Endocrine: Negative for cold intolerance.  Hematologic/Lymphatic: Does  not bruise/bleed easily.  Skin: Negative for itching and rash.  Musculoskeletal: Negative for myalgias.  Gastrointestinal: Negative for abdominal pain, nausea and vomiting.  Genitourinary: Negative for dysuria.  Neurological: Negative for dizziness and weakness.  Psychiatric/Behavioral: Positive for depression. The patient is nervous/anxious.   All other systems reviewed and are negative.        Vitals:   01/11/19 1326  BP: 121/90   (Measured by the patient using a home BP monitor)   Observation/findings during video visit  Objective:    Physical Exam  Constitutional: She is oriented to person, place, and time. She appears well-developed and well-nourished. No distress.  Pulmonary/Chest: Effort normal.  Neurological: She is alert and oriented to person, place, and time.  Psychiatric:  Sitting in a dark room. States that she is depressed   Nursing note and vitals reviewed.         Assessment & Recommendations:   62 y.o. Caucasian female with hypertension, hyperlipidemia, tobacco abuse, major depression, now with decreased exercise tolerance and atypical claudication.  I do suspect that a lot of her symptoms-with fatigue and decreased exercise tolerance could be related to her depression. Nonetheless, it is reasonable to check echocardiogram to evaluate any structural abnormality, and ABI to rule put significant PAD. I will perform an EKG and physical on the day of the above tests and discuss the results with her. Encouraged tobacco cessation.    Nigel Mormon, MD [facility] Pager: [phone] Office: [phone] If no answer Cell [phone]

## 2019-02-22 ENCOUNTER — Other Ambulatory Visit: Payer: Self-pay | Admitting: Family Medicine

## 2019-02-22 ENCOUNTER — Ambulatory Visit
Admission: RE | Admit: 2019-02-22 | Discharge: 2019-02-22 | Disposition: A | Discharge status: Home / Self Care | Payer: Medicaid Other | Source: Ambulatory Visit | Attending: Family Medicine | Admitting: Family Medicine

## 2019-02-22 ENCOUNTER — Other Ambulatory Visit: Payer: Self-pay

## 2019-02-22 DIAGNOSIS — R05 Cough: Secondary | ICD-10-CM

## 2019-02-22 DIAGNOSIS — F17200 Nicotine dependence, unspecified, uncomplicated: Secondary | ICD-10-CM

## 2019-02-22 DIAGNOSIS — R059 Cough, unspecified: Secondary | ICD-10-CM

## 2019-03-25 ENCOUNTER — Other Ambulatory Visit: Payer: Medicaid Other

## 2019-03-25 ENCOUNTER — Ambulatory Visit: Payer: Medicaid Other | Admitting: Cardiology

## 2019-04-23 ENCOUNTER — Other Ambulatory Visit: Payer: Medicaid Other

## 2019-04-23 ENCOUNTER — Ambulatory Visit: Payer: Medicaid Other | Admitting: Cardiology

## 2019-05-19 ENCOUNTER — Ambulatory Visit: Payer: Medicaid Other

## 2019-05-19 ENCOUNTER — Ambulatory Visit (INDEPENDENT_AMBULATORY_CARE_PROVIDER_SITE_OTHER): Payer: Medicaid Other

## 2019-05-19 ENCOUNTER — Other Ambulatory Visit: Payer: Self-pay

## 2019-05-19 DIAGNOSIS — R6889 Other general symptoms and signs: Secondary | ICD-10-CM

## 2019-05-19 DIAGNOSIS — R252 Cramp and spasm: Secondary | ICD-10-CM | POA: Diagnosis not present

## 2019-05-21 NOTE — Progress Notes (Signed)
Subjective:   Julie Turner, female    DOB: March 15, 1956, 63 y.o.   MRN: [id number]   Chief complaint:  Exertional dyspnea   HPI  63 y/o Caucasian female with hypertension, hyperlipidemia, tobacco abuse, major depression, now with  claudication.  Patient is trying to lose weight and thus trying to walk more. However, she has claudication symptoms, more in right calf, with walking less than one mile. Workup showed structural normal heart on echocardiogram. ABI was 0.72 in RLE and 0.96 in LLE.  Patient does report claudication in right leg. She denies chest pain, shortness of breath, palpitations, leg edema, orthopnea, PND, TIA/syncope.  She currently smokes 1 pack per day. She has quit once in the past, but could not sustain. She has h/o depression, currently on Cymbalta.    Past Medical History:  Diagnosis Date  . Anxiety   . Arthritis    "all my joints"  . Breast cancer, right breast ([place]) 2001   S/P lumpecbomy and radiation  . Depression   . Fibromyalgia   . GERD (gastroesophageal reflux disease)   . High cholesterol   . Hypertension    "just when I had radiation in 2001"  . Migraine    "when I don't take my ASA qd" (08/15/2014)  . Numbness and tingling in left hand   . Spinal headache   . Stroke [facility]) ~ 2013   "silent stroke" per her neurologist     Past Surgical History:  Procedure Laterality Date  . BREAST LUMPECTOMY Right 2001  . BREAST LUMPECTOMY Right    2nd time w/"all the lymph nodes under my arm"  . BREAST RECONSTRUCTION Bilateral   . CESAREAN SECTION  1987; 1988  . CHOLECYSTECTOMY  2001  . DILATION AND CURETTAGE OF UTERUS  2001  . FOOT SURGERY Left ~ 2012   joint cleaned out  . JOINT REPLACEMENT    . TOTAL HIP ARTHROPLASTY Right 08/15/2014  . TOTAL HIP ARTHROPLASTY Right 08/15/2014   Procedure: RIGHT TOTAL HIP ARTHROPLASTY;  Surgeon: Kerin Salen, MD;  Location: [place];  Service: Orthopedics;  Laterality: Right;     Social History   Socioeconomic  History  . Marital status: Single    Spouse name: Not on file  . Number of children: 2  . Years of education: Not on file  . Highest education level: Not on file  Occupational History  . Not on file  Social Needs  . Financial resource strain: Not on file  . Food insecurity    Worry: Not on file    Inability: Not on file  . Transportation needs    Medical: Not on file    Non-medical: Not on file  Tobacco Use  . Smoking status: Current Every Day Smoker    Packs/day: 0.50    Years: 36.00    Pack years: 18.00    Types: Cigarettes  . Smokeless tobacco: Never Used  Substance and Sexual Activity  . Alcohol use: Yes    Comment: 08/15/2014 "a few times/year"  . Drug use: No  . Sexual activity: Never  Lifestyle  . Physical activity    Days per week: Not on file    Minutes per session: Not on file  . Stress: Not on file  Relationships  . Social Herbalist on phone: Not on file    Gets together: Not on file    Attends religious service: Not on file    Active member of club or  organization: Not on file    Attends meetings of clubs or organizations: Not on file    Relationship status: Not on file  . Intimate partner violence    Fear of current or ex partner: Not on file    Emotionally abused: Not on file    Physically abused: Not on file    Forced sexual activity: Not on file  Other Topics Concern  . Not on file  Social History Narrative  . Not on file     Family History  Problem Relation Age of Onset  . Hypertension Father      Current Outpatient Medications on File Prior to Visit  Medication Sig Dispense Refill  . ALPRAZolam (XANAX) 1 MG tablet Take 1 mg by mouth as needed for sleep.     . Brexpiprazole 0.25 MG TABS Take by mouth.    . carisoprodol (SOMA) 350 MG tablet Take 350 mg by mouth 4 (four) times daily as needed.    . DULoxetine (CYMBALTA) 60 MG capsule Take 60 mg by mouth daily.    Marland Kitchen gabapentin (NEURONTIN) 600 MG tablet Take 600 mg by mouth  daily.    . hydrochlorothiazide (HYDRODIURIL) 12.5 MG tablet Take 12.5 mg by mouth daily.    Marland Kitchen loratadine (CLARITIN) 10 MG tablet Take 10 mg by mouth as needed for allergies.    Marland Kitchen propranolol (INDERAL) 10 MG tablet Take 10 mg by mouth daily.     No current facility-administered medications on file prior to visit.     Cardiovascular studies:  EKG 05/24/2019: Sinus rhythm 86 bpm.  RSR(V1) -nondiagnostic.   ABI 05/19/2019: This exam reveals moderately decreased perfusion of the right lower extremity, noted at the dorsalis pedis and post tibial artery level (ABI 0.72) and mildly decreased perfusion of the left lower extremity, noted at the dorsalis pedis and post tibial artery level (ABI 0.96).   Echocardiogram 05/19/2019: Left ventricle cavity is normal in size. Mild concentric hypertrophy of the left ventricle. Normal LV systolic function with EF 64%. Normal global wall motion. Normal diastolic filling pattern.  No significant valvular abnormality.  Anterior pericardial fat pad seen. No effusion.  Recent labs: 07/10/2018: Glucose 101. BUN/Cr 14/0.69, eGFR 87. Na/K 140/4.1. Rest of the CMP normal. H/H 1/46. MCV 93. Platelets 280.   Review of Systems  Constitution: Positive for malaise/fatigue. Negative for decreased appetite, weight gain and weight loss.  HENT: Negative for congestion.   Eyes: Negative for visual disturbance.  Cardiovascular: Positive for claudication. Negative for chest pain, dyspnea on exertion, leg swelling, palpitations and syncope.  Respiratory: Negative for cough.   Endocrine: Negative for cold intolerance.  Hematologic/Lymphatic: Does not bruise/bleed easily.  Skin: Negative for itching and rash.  Musculoskeletal: Negative for myalgias.  Gastrointestinal: Negative for abdominal pain, nausea and vomiting.  Genitourinary: Negative for dysuria.  Neurological: Negative for dizziness and weakness.  Psychiatric/Behavioral: Positive for depression. The patient is  nervous/anxious.   All other systems reviewed and are negative.        Vitals:   05/24/19 1553  BP: 136/81  Pulse: 84  Temp: 97.8 F (36.6 C)  SpO2: 97%     Objective:    Physical Exam  Constitutional: She is oriented to person, place, and time. She appears well-developed and well-nourished. No distress.  HENT:  Head: Normocephalic and atraumatic.  Eyes: Pupils are equal, round, and reactive to light. Conjunctivae are normal.  Neck: No JVD present.  Cardiovascular: Normal rate, regular rhythm and intact distal pulses.  No  murmur heard. Pulses:      Femoral pulses are 2+ on the right side and 2+ on the left side.      Popliteal pulses are 1+ on the right side and 1+ on the left side.       Dorsalis pedis pulses are 0 on the right side and 1+ on the left side.       Posterior tibial pulses are 1+ on the right side and 1+ on the left side.  Pulmonary/Chest: Effort normal and breath sounds normal. She has no wheezes. She has no rales.  Abdominal: Soft. Bowel sounds are normal. There is no rebound.  Musculoskeletal:        General: No edema.  Lymphadenopathy:    She has no cervical adenopathy.  Neurological: She is alert and oriented to person, place, and time. No cranial nerve deficit.  Skin: Skin is warm and dry.  Psychiatric: She has a normal mood and affect.  Nursing note and vitals reviewed.         Assessment & Recommendations:   63 y/o Caucasian female with hypertension, hyperlipidemia, tobacco abuse, major depression, now with  claudication.  Claudication: ABI RLE 0.72, LLE 0.96. Recommend medical management and regular walking.  Recommend aggressive risk factor modification. Blood pressure is well controlled. Patient has tried statins before and is not willing to try again due to myalgias. Discussed tobacco cessation in detail.  Started plavix 75 mg daily and Pletal 50 mg bid.  Check lipid panel.   Tobacco cessation counseling (CPT [phone]):  - Currently  smoking 1 packs/day   - Patient was informed of the dangers of tobacco abuse including stroke, cancer, and MI, as well as benefits of tobacco cessation. - Patient is willing to quit at this time. - Approximately 6 mins were spent counseling patient cessation techniques. We discussed various methods to help quit smoking, including deciding on a date to quit, joining a support group, pharmacological agents- Patient would like to use Wellbutrin. Started Wellbutrin 100 mg bid.  - I will reassess her progress at the next follow-up visit     Nigel Mormon, MD [facility] Pager: [phone] Office: [phone] If no answer Cell [phone]

## 2019-05-24 ENCOUNTER — Other Ambulatory Visit: Payer: Self-pay

## 2019-05-24 ENCOUNTER — Encounter: Payer: Self-pay | Admitting: Cardiology

## 2019-05-24 ENCOUNTER — Ambulatory Visit: Payer: Medicaid Other | Admitting: Cardiology

## 2019-05-24 VITALS — BP 136/81 | HR 84 | Temp 97.8°F | Ht 66.0 in | Wt 199.0 lb

## 2019-05-24 DIAGNOSIS — I739 Peripheral vascular disease, unspecified: Secondary | ICD-10-CM | POA: Diagnosis not present

## 2019-05-24 DIAGNOSIS — I10 Essential (primary) hypertension: Secondary | ICD-10-CM

## 2019-05-24 DIAGNOSIS — Z720 Tobacco use: Secondary | ICD-10-CM | POA: Diagnosis not present

## 2019-05-24 MED ORDER — BUPROPION HCL ER (SR) 100 MG PO TB12: 100.0000 mg | ORAL_TABLET | Freq: Two times a day (BID) | ORAL | 3 refills | Status: DC

## 2019-05-24 MED ORDER — CLOPIDOGREL BISULFATE 75 MG PO TABS: 75.0000 mg | ORAL_TABLET | Freq: Every day | ORAL | 3 refills | Status: DC

## 2019-05-24 MED ORDER — CILOSTAZOL 50 MG PO TABS: 50.0000 mg | ORAL_TABLET | Freq: Two times a day (BID) | ORAL | 3 refills | Status: DC

## 2019-08-27 ENCOUNTER — Ambulatory Visit: Payer: Medicaid Other | Admitting: Cardiology

## 2019-09-17 ENCOUNTER — Ambulatory Visit: Payer: Medicaid Other | Attending: Internal Medicine

## 2019-09-17 DIAGNOSIS — Z20822 Contact with and (suspected) exposure to covid-19: Secondary | ICD-10-CM

## 2019-09-19 LAB — NOVEL CORONAVIRUS, NAA: SARS-CoV-2, NAA: NOT DETECTED

## 2019-09-22 ENCOUNTER — Telehealth: Payer: Self-pay

## 2019-09-22 ENCOUNTER — Other Ambulatory Visit: Payer: Self-pay | Admitting: Cardiology

## 2019-09-22 DIAGNOSIS — I739 Peripheral vascular disease, unspecified: Secondary | ICD-10-CM

## 2019-09-22 DIAGNOSIS — Z720 Tobacco use: Secondary | ICD-10-CM

## 2019-09-22 NOTE — Telephone Encounter (Signed)
Ok. Reschedule appt at patients convenience.

## 2019-09-22 NOTE — Telephone Encounter (Signed)
Patient canceled appointment she does not feel comfortable going to the lab at this time. She left a message with medical assistant

## 2019-09-24 ENCOUNTER — Other Ambulatory Visit: Payer: Self-pay | Admitting: Cardiology

## 2019-09-24 DIAGNOSIS — Z720 Tobacco use: Secondary | ICD-10-CM

## 2019-09-27 ENCOUNTER — Ambulatory Visit: Payer: Medicaid Other | Admitting: Cardiology

## 2019-10-26 ENCOUNTER — Other Ambulatory Visit: Payer: Self-pay | Admitting: Cardiology

## 2019-10-26 DIAGNOSIS — Z720 Tobacco use: Secondary | ICD-10-CM

## 2020-03-04 ENCOUNTER — Other Ambulatory Visit: Payer: Self-pay | Admitting: Cardiology

## 2020-03-04 DIAGNOSIS — Z720 Tobacco use: Secondary | ICD-10-CM

## 2020-03-31 ENCOUNTER — Other Ambulatory Visit (HOSPITAL_COMMUNITY): Payer: Self-pay | Admitting: Cardiology

## 2020-04-01 LAB — LIPID PANEL
Chol/HDL Ratio: 5.8 ratio — ABNORMAL HIGH (ref 0.0–4.4)
Cholesterol, Total: 214 mg/dL — ABNORMAL HIGH (ref 100–199)
HDL: 37 mg/dL — ABNORMAL LOW (ref >39)
LDL Chol Calc (NIH): 130 mg/dL — ABNORMAL HIGH (ref 0–99)
Triglycerides: 265 mg/dL — ABNORMAL HIGH (ref 0–149)
VLDL Cholesterol Cal: 47 mg/dL — ABNORMAL HIGH (ref 5–40)

## 2020-04-04 ENCOUNTER — Other Ambulatory Visit: Payer: Self-pay | Admitting: Cardiology

## 2020-04-04 DIAGNOSIS — Z720 Tobacco use: Secondary | ICD-10-CM

## 2020-04-04 NOTE — Progress Notes (Signed)
Received these lab results. She had canceled lab test and ppt in 09/2019. Will she be seeing me for f/u after these labs?  Thanks MJP

## 2020-04-04 NOTE — Progress Notes (Signed)
Scheduled for 04/20/2020

## 2020-04-20 ENCOUNTER — Ambulatory Visit: Payer: Medicaid Other | Admitting: Cardiology

## 2020-09-29 ENCOUNTER — Telehealth: Payer: Self-pay | Admitting: Hematology

## 2020-09-29 NOTE — Telephone Encounter (Signed)
Received a new hem referral from Dr. Jacelyn Grip for leukocytosis. Julie Turner returned my call and has been scheduled to see Dr. Irene Limbo on 2/1 at 1pm. Pt aware to arrive 20 minutes early.

## 2020-10-09 NOTE — Progress Notes (Signed)
HEMATOLOGY/ONCOLOGY CONSULTATION NOTE  Date of Service: 10/09/2020  Patient Care Team: Vernie Shanks, MD as PCP - General (Family Medicine)  CHIEF COMPLAINTS/PURPOSE OF CONSULTATION:  Leukocytosis  HISTORY OF PRESENTING ILLNESS:   Julie Turner is a wonderful 65 y.o. female who has been referred to [place] by Dr. Yaakov Guthrie, MD for evaluation and management of leukocytosis. The pt reports that she is doing well overall.  The pt reports that she was referred by her PCP due to chronic elevated WBC. She was not experiencing any new symptoms or concerns. She is not worried about the elevated counts and notes her grandfather also had elevated WBC. The pt reports she has bad allergies and felt that she had a cold for three months in the past Fall season. She notes she had an infected hair in her groin area under the skin within the last two months that was the size of half a dollar bill. These have since been resolved using Bactum.  The pt denies any dental issues, but notes itching in her groin area. She notes no medication changes or steroids in last months. All of her current medical issues are under control at this time she notes. The pt does note cramping in her right leg/hip, alongside tingling/numbness due to her ongoing Peripheral Arterial Disease.  The pt notes that her left ankle and foot swelled two months ago for a period of 1-2 weeks. This resolved on its own and she is currently not experiencing any issues related to this.  The pt reported she had Breast cancer in 2000 in [place], [place]. She experienced problems with the radiation, which was solved by the Wound Vac.  The pt notes that she smokes 10-12 cigarettes daily currently. She desires to quit but has not done so yet.  Labwork from 09/21/2020 showed a WBC of 11.2K and Lymph Abs of 3.0K.   On review of systems, pt reports itching in groin area, recent infections and denies unexpected weight loss, new lumps/bumps, fevers, chills,  abdominal pain, back pain, leg swelling and any other symptoms.   MEDICAL HISTORY:  Past Medical History:  Diagnosis Date  . Anxiety   . Arthritis    "all my joints"  . Breast cancer, right breast ([place]) 2001   S/P lumpecbomy and radiation  . Depression   . Fibromyalgia   . GERD (gastroesophageal reflux disease)   . High cholesterol   . Hypertension    "just when I had radiation in 2001"  . Migraine    "when I don't take my ASA qd" (08/15/2014)  . Numbness and tingling in left hand   . Spinal headache   . Stroke [facility]) ~ 2013   "silent stroke" per her neurologist    SURGICAL HISTORY: Past Surgical History:  Procedure Laterality Date  . BREAST LUMPECTOMY Right 2001  . BREAST LUMPECTOMY Right    2nd time w/"all the lymph nodes under my arm"  . BREAST RECONSTRUCTION Bilateral   . CESAREAN SECTION  1987; 1988  . CHOLECYSTECTOMY  2001  . DILATION AND CURETTAGE OF UTERUS  2001  . FOOT SURGERY Left ~ 2012   joint cleaned out  . JOINT REPLACEMENT    . TOTAL HIP ARTHROPLASTY Right 08/15/2014  . TOTAL HIP ARTHROPLASTY Right 08/15/2014   Procedure: RIGHT TOTAL HIP ARTHROPLASTY;  Surgeon: Kerin Salen, MD;  Location: [facility];  Service: Orthopedics;  Laterality: Right;    SOCIAL HISTORY: Social History   Socioeconomic History  . Marital  status: Single    Spouse name: Not on file  . Number of children: 2  . Years of education: Not on file  . Highest education level: Not on file  Occupational History  . Not on file  Tobacco Use  . Smoking status: Current Every Day Smoker    Packs/day: 0.50    Years: 36.00    Pack years: 18.00    Types: Cigarettes  . Smokeless tobacco: Never Used  Substance and Sexual Activity  . Alcohol use: Yes    Comment: 08/15/2014 "a few times/year"  . Drug use: No  . Sexual activity: Never  Other Topics Concern  . Not on file  Social History Narrative  . Not on file   Social Determinants of Health   Financial Resource Strain: Not on file  Food  Insecurity: Not on file  Transportation Needs: Not on file  Physical Activity: Not on file  Stress: Not on file  Social Connections: Not on file  Intimate Partner Violence: Not on file    FAMILY HISTORY: Family History  Problem Relation Age of Onset  . Hypertension Father     ALLERGIES:  is allergic to nsaids, statins, tape, arimidex [anastrozole], and tamoxifen citrate.  MEDICATIONS:  Current Outpatient Medications  Medication Sig Dispense Refill  . ALPRAZolam (XANAX) 1 MG tablet Take 1 mg by mouth as needed for sleep.     Marland Kitchen buPROPion (WELLBUTRIN SR) 100 MG 12 hr tablet TAKE ONE TABLET BY MOUTH TWICE A DAY 60 tablet 3  . carisoprodol (SOMA) 350 MG tablet Take 350 mg by mouth 4 (four) times daily as needed.    . cilostazol (PLETAL) 50 MG tablet TAKE ONE TABLET BY MOUTH TWICE A DAY 180 tablet 2  . clopidogrel (PLAVIX) 75 MG tablet TAKE ONE TABLET BY MOUTH DAILY 90 tablet 2  . DULoxetine (CYMBALTA) 60 MG capsule Take 60 mg by mouth daily.    Marland Kitchen gabapentin (NEURONTIN) 600 MG tablet Take 600 mg by mouth daily.    . hydrochlorothiazide (HYDRODIURIL) 12.5 MG tablet Take 12.5 mg by mouth daily.    Marland Kitchen loratadine (CLARITIN) 10 MG tablet Take 10 mg by mouth as needed for allergies.    Marland Kitchen propranolol (INDERAL) 10 MG tablet Take 10 mg by mouth daily.     No current facility-administered medications for this visit.    REVIEW OF SYSTEMS:    10 Point review of Systems was done is negative except as noted above.  PHYSICAL EXAMINATION: ECOG PERFORMANCE STATUS: 1 - Symptomatic but completely ambulatory  . Vitals:   10/10/20 1331  BP: (!) 151/88  Pulse: 88  Resp: 18  Temp: (!) 97 F (36.1 C)  SpO2: 99%   Filed Weights   10/10/20 1331  Weight: 221 lb 3.2 oz (100.3 kg)   .Body mass index is 35.7 kg/m.   GENERAL:alert, in no acute distress and comfortable SKIN: no acute rashes, no significant lesions EYES: conjunctiva are pink and non-injected, sclera anicteric OROPHARYNX: MMM, no  exudates, no oropharyngeal erythema or ulceration NECK: supple, no JVD LYMPH:  no palpable lymphadenopathy in the cervical, axillary or inguinal regions LUNGS: clear to auscultation b/l with normal respiratory effort HEART: regular rate & rhythm ABDOMEN:  normoactive bowel sounds , non tender, not distended. Extremity: no pedal edema PSYCH: alert & oriented x 3 with fluent speech NEURO: no focal motor/sensory deficits  LABORATORY DATA:  I have reviewed the data as listed  . CBC Latest Ref Rng & Units 10/10/2020 08/16/2014 08/09/2014  WBC 4.0 - 10.5 K/uL 13.4(H) 10.7(H) 13.3(H)  Hemoglobin 12.0 - 15.0 g/dL 14.9 10.7(L) 13.7  Hematocrit 36.0 - 46.0 % 45.9 32.7(L) 42.0  Platelets 150 - 400 K/uL 302 290 354    . CMP Latest Ref Rng & Units 10/10/2020 08/16/2014 08/09/2014  Glucose 70 - 99 mg/dL 95 130(H) 93  BUN 8 - 23 mg/dL 12 13 12   Creatinine 0.44 - 1.00 mg/dL 0.80 0.72 0.70  Sodium 135 - 145 mmol/L 140 141 140  Potassium 3.5 - 5.1 mmol/L 3.8 3.8 4.1  Chloride 98 - 111 mmol/L 107 102 99  CO2 22 - 32 mmol/L 25 25 26   Calcium 8.9 - 10.3 mg/dL 9.4 9.3 10.1  Total Protein 6.5 - 8.1 g/dL 7.1 - -  Total Bilirubin 0.3 - 1.2 mg/dL 0.5 - -  Alkaline Phos 38 - 126 U/L 91 - -  AST 15 - 41 U/L 13(L) - -  ALT 0 - 44 U/L 19 - -     RADIOGRAPHIC STUDIES: I have personally reviewed the radiological images as listed and agreed with the findings in the report. No results found.  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 Likely reactive from cigarette smoking. PLAN: -Advised pt her WBC were only borderline high at 11.2K, with elevated neutrophils and borderline high Lymph Abs.  -Advised pt that chronic WBC elevated counts rules out acute Leukemias and is non-progressive, which makes bone marrow problems unlikely.  -Advised pt this is most likely reactive problem. Her Plt and RBC are normal.  -Advised pt the  cramping in right leg only could be due to differential in right versus left leg in relation to her Peripheral Arterial Disease.  -Will get further labs today. -Will see back in 2 weeks via phone.   FOLLOW UP: Labs today Phone visit with Dr Irene Limbo in 2 weeks  . Orders Placed This Encounter  Procedures  . CBC with Differential/Platelet    Standing Status:   Future    Number of Occurrences:   1    Standing Expiration Date:   10/10/2021  . CMP ([facility] only)    Standing Status:   Future    Number of Occurrences:   1    Standing Expiration Date:   10/10/2021  . Flow Cytometry, Peripheral Blood (Oncology)    Atypical lymphocytes r/o lymphoproliferative clonal disorder    Standing Status:   Future    Number of Occurrences:   1    Standing Expiration Date:   10/10/2021  . JAK2 (including V617F and Exon 12), MPL, and CALR-Next Generation Sequencing    Standing Status:   Future    Number of Occurrences:   1    Standing Expiration Date:   10/10/2021  . BCR ABL1 FISH ([facility])    Standing Status:   Future    Number of Occurrences:   1    Standing Expiration Date:   10/10/2021    All of the patients questions were answered with apparent satisfaction. The patient knows to call the clinic with any problems, questions or concerns.  I spent 40 minutes counseling the patient face to face. The total time spent in the appointment was 50 mins and more than 50% was on counseling and direct patient cares, review of previous w/u and medical records.    Sullivan Lone MD MS AAHIVMS [facility] Hematology/Oncology Physician [facility]  (  Office):       [phone] (Work cell):  [phone] (Fax):           [phone]  10/09/2020 6:05 PM  I, Reinaldo Raddle, am acting as scribe for Dr. Sullivan Lone, MD.   .I have reviewed the above documentation for accuracy and completeness, and I agree with the above. Brunetta Genera MD

## 2020-10-10 ENCOUNTER — Other Ambulatory Visit: Payer: Self-pay

## 2020-10-10 ENCOUNTER — Inpatient Hospital Stay: Payer: Medicaid Other

## 2020-10-10 ENCOUNTER — Inpatient Hospital Stay: Payer: Medicaid Other | Attending: Hematology | Admitting: Hematology

## 2020-10-10 VITALS — BP 151/88 | HR 88 | Temp 97.0°F | Resp 18 | Ht 66.0 in | Wt 221.2 lb

## 2020-10-10 DIAGNOSIS — I739 Peripheral vascular disease, unspecified: Secondary | ICD-10-CM | POA: Diagnosis not present

## 2020-10-10 DIAGNOSIS — Z8673 Personal history of transient ischemic attack (TIA), and cerebral infarction without residual deficits: Secondary | ICD-10-CM | POA: Insufficient documentation

## 2020-10-10 DIAGNOSIS — M797 Fibromyalgia: Secondary | ICD-10-CM | POA: Diagnosis not present

## 2020-10-10 DIAGNOSIS — D72829 Elevated white blood cell count, unspecified: Secondary | ICD-10-CM

## 2020-10-10 DIAGNOSIS — E7800 Pure hypercholesterolemia, unspecified: Secondary | ICD-10-CM | POA: Diagnosis not present

## 2020-10-10 DIAGNOSIS — M129 Arthropathy, unspecified: Secondary | ICD-10-CM | POA: Insufficient documentation

## 2020-10-10 DIAGNOSIS — K219 Gastro-esophageal reflux disease without esophagitis: Secondary | ICD-10-CM | POA: Diagnosis not present

## 2020-10-10 DIAGNOSIS — Z853 Personal history of malignant neoplasm of breast: Secondary | ICD-10-CM | POA: Insufficient documentation

## 2020-10-10 DIAGNOSIS — L299 Pruritus, unspecified: Secondary | ICD-10-CM | POA: Insufficient documentation

## 2020-10-10 DIAGNOSIS — F418 Other specified anxiety disorders: Secondary | ICD-10-CM | POA: Insufficient documentation

## 2020-10-10 DIAGNOSIS — F17210 Nicotine dependence, cigarettes, uncomplicated: Secondary | ICD-10-CM | POA: Diagnosis not present

## 2020-10-10 DIAGNOSIS — Z923 Personal history of irradiation: Secondary | ICD-10-CM | POA: Diagnosis not present

## 2020-10-10 DIAGNOSIS — Z79899 Other long term (current) drug therapy: Secondary | ICD-10-CM | POA: Insufficient documentation

## 2020-10-10 LAB — CBC WITH DIFFERENTIAL/PLATELET
Abs Immature Granulocytes: 0.05 10*3/uL (ref 0.00–0.07)
Basophils Absolute: 0 10*3/uL (ref 0.0–0.1)
Basophils Relative: 0 %
Eosinophils Absolute: 0.1 10*3/uL (ref 0.0–0.5)
Eosinophils Relative: 1 %
HCT: 45.9 % (ref 36.0–46.0)
Hemoglobin: 14.9 g/dL (ref 12.0–15.0)
Immature Granulocytes: 0 %
Lymphocytes Relative: 29 %
Lymphs Abs: 3.9 10*3/uL (ref 0.7–4.0)
MCH: 30.3 pg (ref 26.0–34.0)
MCHC: 32.5 g/dL (ref 30.0–36.0)
MCV: 93.3 fL (ref 80.0–100.0)
Monocytes Absolute: 0.9 10*3/uL (ref 0.1–1.0)
Monocytes Relative: 7 %
Neutro Abs: 8.4 10*3/uL — ABNORMAL HIGH (ref 1.7–7.7)
Neutrophils Relative %: 63 %
Platelets: 302 10*3/uL (ref 150–400)
RBC: 4.92 MIL/uL (ref 3.87–5.11)
RDW: 13.2 % (ref 11.5–15.5)
WBC: 13.4 10*3/uL — ABNORMAL HIGH (ref 4.0–10.5)
nRBC: 0 % (ref 0.0–0.2)

## 2020-10-10 LAB — CMP (CANCER CENTER ONLY)
ALT: 19 U/L (ref 0–44)
AST: 13 U/L — ABNORMAL LOW (ref 15–41)
Albumin: 4.1 g/dL (ref 3.5–5.0)
Alkaline Phosphatase: 91 U/L (ref 38–126)
Anion gap: 8 (ref 5–15)
BUN: 12 mg/dL (ref 8–23)
CO2: 25 mmol/L (ref 22–32)
Calcium: 9.4 mg/dL (ref 8.9–10.3)
Chloride: 107 mmol/L (ref 98–111)
Creatinine: 0.8 mg/dL (ref 0.44–1.00)
GFR, Estimated: >60 mL/min (ref >60)
Glucose, Bld: 95 mg/dL (ref 70–99)
Potassium: 3.8 mmol/L (ref 3.5–5.1)
Sodium: 140 mmol/L (ref 135–145)
Total Bilirubin: 0.5 mg/dL (ref 0.3–1.2)
Total Protein: 7.1 g/dL (ref 6.5–8.1)

## 2020-10-11 LAB — SURGICAL PATHOLOGY

## 2020-10-12 LAB — FLOW CYTOMETRY

## 2020-10-18 LAB — BCR ABL1 FISH (GENPATH)

## 2020-10-18 LAB — JAK2 (INCLUDING V617F AND EXON 12), MPL,& CALR-NEXT GEN SEQ

## 2020-10-23 NOTE — Progress Notes (Signed)
HEMATOLOGY/ONCOLOGY CONSULTATION NOTE  Date of Service: 10/23/2020  Patient Care Team: Vernie Shanks, MD as PCP - General (Family Medicine)  CHIEF COMPLAINTS/PURPOSE OF CONSULTATION:  Leukocytosis  HISTORY OF PRESENTING ILLNESS:   Julie Turner is a wonderful 65 y.o. female who has been referred to [place] by Dr. Yaakov Guthrie, MD for evaluation and management of leukocytosis. The pt reports that she is doing well overall.  The pt reports that she was referred by her PCP due to chronic elevated WBC. She was not experiencing any new symptoms or concerns. She is not worried about the elevated counts and notes her grandfather also had elevated WBC. The pt reports she has bad allergies and felt that she had a cold for three months in the past Fall season. She notes she had an infected hair in her groin area under the skin within the last two months that was the size of half a dollar bill. These have since been resolved using Bactum.  The pt denies any dental issues, but notes itching in her groin area. She notes no medication changes or steroids in last months. All of her current medical issues are under control at this time she notes. The pt does note cramping in her right leg/hip, alongside tingling/numbness due to her ongoing Peripheral Arterial Disease.  The pt notes that her left ankle and foot swelled two months ago for a period of 1-2 weeks. This resolved on its own and she is currently not experiencing any issues related to this.  The pt reported she had Breast cancer in 2000 in [place], [place]. She experienced problems with the radiation, which was solved by the Wound Vac.  The pt notes that she smokes 10-12 cigarettes daily currently. She desires to quit but has not done so yet.  Labwork from 09/21/2020 showed a WBC of 11.2K and Lymph Abs of 3.0K.   On review of systems, pt reports itching in groin area, recent infections and denies unexpected weight loss, new lumps/bumps, fevers, chills,  abdominal pain, back pain, leg swelling and any other symptoms.  INTERVAL HISTORY  I connected with Julie Turner on 10/24/2020 by telephone and verified that I am speaking with the correct person using two identifiers.   I discussed the limitations of evaluation and management by telemedicine. The patient expressed understanding and agreed to proceed.   Other persons participating in the visit and their role in the encounter:                                                         - Reinaldo Raddle, Medical Scribe     Patient's location: Home Provider's location: [place] [facility] [place] is a wonderful 65 y.o. female who is here today for evaluation and management of leukocytosis. The patient's last visit with [place] was on 10/10/2020. The pt reports that she is doing well overall.  The pt reports that her whole body has been in pain. She is not able to take ibuprofen. The pt notes that she saw a Cardiologist and has neuropathy. She is on several different medications for managing her pain.   Lab results 10/10/2020 of CBC w/diff and CMP is as follows: all values are WNL except for WBC of 13.4K, Neutro Abs of 8.4K, AST of 13.  On review  of systems, pt reports pain all over whole body.   MEDICAL HISTORY:  Past Medical History:  Diagnosis Date  . Anxiety   . Arthritis    "all my joints"  . Breast cancer, right breast ([place]) 2001   S/P lumpecbomy and radiation  . Depression   . Fibromyalgia   . GERD (gastroesophageal reflux disease)   . High cholesterol   . Hypertension    "just when I had radiation in 2001"  . Migraine    "when I don't take my ASA qd" (08/15/2014)  . Numbness and tingling in left hand   . Spinal headache   . Stroke [facility]) ~ 2013   "silent stroke" per her neurologist    SURGICAL HISTORY: Past Surgical History:  Procedure Laterality Date  . BREAST LUMPECTOMY Right 2001  . BREAST LUMPECTOMY Right    2nd time w/"all the lymph nodes under my arm"  . BREAST  RECONSTRUCTION Bilateral   . CESAREAN SECTION  1987; 1988  . CHOLECYSTECTOMY  2001  . DILATION AND CURETTAGE OF UTERUS  2001  . FOOT SURGERY Left ~ 2012   joint cleaned out  . JOINT REPLACEMENT    . TOTAL HIP ARTHROPLASTY Right 08/15/2014  . TOTAL HIP ARTHROPLASTY Right 08/15/2014   Procedure: RIGHT TOTAL HIP ARTHROPLASTY;  Surgeon: Kerin [place], MD;  Location: [facility];  Service: Orthopedics;  Laterality: Right;    SOCIAL HISTORY: Social History   Socioeconomic History  . Marital status: Single    Spouse name: Not on file  . Number of children: 2  . Years of education: Not on file  . Highest education level: Not on file  Occupational History  . Not on file  Tobacco Use  . Smoking status: Current Every Day Smoker    Packs/day: 0.50    Years: 36.00    Pack years: 18.00    Types: Cigarettes  . Smokeless tobacco: Never Used  Substance and Sexual Activity  . Alcohol use: Yes    Comment: 08/15/2014 "a few times/year"  . Drug use: No  . Sexual activity: Never  Other Topics Concern  . Not on file  Social History Narrative  . Not on file   Social Determinants of Health   Financial Resource Strain: Not on file  Food Insecurity: Not on file  Transportation Needs: Not on file  Physical Activity: Not on file  Stress: Not on file  Social Connections: Not on file  Intimate Partner Violence: Not on file    FAMILY HISTORY: Family History  Problem Relation Age of Onset  . Hypertension Father     ALLERGIES:  is allergic to nsaids, statins, tape, arimidex [anastrozole], and tamoxifen citrate.  MEDICATIONS:  Current Outpatient Medications  Medication Sig Dispense Refill  . ALPRAZolam (XANAX) 1 MG tablet Take 1 mg by mouth as needed for sleep.     Marland Kitchen buPROPion (WELLBUTRIN SR) 100 MG 12 hr tablet TAKE ONE TABLET BY MOUTH TWICE A DAY 60 tablet 3  . carisoprodol (SOMA) 350 MG tablet Take 350 mg by mouth 4 (four) times daily as needed.    . cilostazol (PLETAL) 50 MG tablet TAKE ONE  TABLET BY MOUTH TWICE A DAY 180 tablet 2  . clopidogrel (PLAVIX) 75 MG tablet TAKE ONE TABLET BY MOUTH DAILY 90 tablet 2  . DULoxetine (CYMBALTA) 60 MG capsule Take 60 mg by mouth daily.    Marland Kitchen gabapentin (NEURONTIN) 600 MG tablet Take 600 mg by mouth daily.    . hydrochlorothiazide (  HYDRODIURIL) 12.5 MG tablet Take 12.5 mg by mouth daily.    Marland Kitchen loratadine (CLARITIN) 10 MG tablet Take 10 mg by mouth as needed for allergies.    Marland Kitchen propranolol (INDERAL) 10 MG tablet Take 10 mg by mouth daily.     No current facility-administered medications for this visit.    REVIEW OF SYSTEMS:   10 Point review of Systems was done is negative except as noted above.  PHYSICAL EXAMINATION: ECOG PERFORMANCE STATUS: 1 - Symptomatic but completely ambulatory  . There were no vitals filed for this visit. There were no vitals filed for this visit. .There is no height or weight on file to calculate BMI.  Telehealth Visit.  LABORATORY DATA:  I have reviewed the data as listed  CBC Latest Ref Rng & Units 10/10/2020 08/16/2014 08/09/2014  WBC 4.0 - 10.5 K/uL 13.4(H) 10.7(H) 13.3(H)  Hemoglobin 12.0 - 15.0 g/dL 14.9 10.7(L) 13.7  Hematocrit 36.0 - 46.0 % 45.9 32.7(L) 42.0  Platelets 150 - 400 K/uL 302 290 354    CMP Latest Ref Rng & Units 10/10/2020 08/16/2014 08/09/2014  Glucose 70 - 99 mg/dL 95 130(H) 93  BUN 8 - 23 mg/dL 12 13 12   Creatinine 0.44 - 1.00 mg/dL 0.80 0.72 0.70  Sodium 135 - 145 mmol/L 140 141 140  Potassium 3.5 - 5.1 mmol/L 3.8 3.8 4.1  Chloride 98 - 111 mmol/L 107 102 99  CO2 22 - 32 mmol/L 25 25 26   Calcium 8.9 - 10.3 mg/dL 9.4 9.3 10.1  Total Protein 6.5 - 8.1 g/dL 7.1 - -  Total Bilirubin 0.3 - 1.2 mg/dL 0.5 - -  Alkaline Phos 38 - 126 U/L 91 - -  AST 15 - 41 U/L 13(L) - -  ALT 0 - 44 U/L 19 - -   10/10/2020 BCR-ABL    10/10/2020 JAK2   10/10/2020 Surgical Pathology     RADIOGRAPHIC STUDIES: I have personally reviewed the radiological images as listed and agreed with the  findings in the report. No results found.  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 Likely reactive from cigarette smoking.   PLAN: -Discussed pt labs, 10/10/2020; WBC elevated, other counts normal. Neutrophilia. All mutation studies came back negative. Flow normal with no clonal populations.  -Advised pt that we were able to rule out a bone marrow problem with these results. -Advised pt this is likely to be a reactive chronic inflammation that affects the blood counts and is due to past history of smoking or another stressor. -Advise pt to continue to f/u w PCP for pain medication management. -Will discharge pt to PCP.  FOLLOW UP: Return to PCP    No orders of the defined types were placed in this encounter.   All of the patients questions were answered with apparent satisfaction. The patient knows to call the clinic with any problems, questions or concerns.  The total time spent in the appointment was 20 minutes and more than 50% was on counseling and direct patient cares.   Sullivan Lone MD [place] AAHIVMS [facility] Hematology/Oncology Physician [facility]  (Office):       [phone] (Work cell):  [phone] (Fax):           [phone]  10/23/2020 4:09 PM  I, Reinaldo Raddle, am acting as scribe for Dr. Sullivan Lone, MD.   .I have reviewed the above documentation for accuracy  and completeness, and I agree with the above. Brunetta Genera MD

## 2020-10-24 ENCOUNTER — Inpatient Hospital Stay (HOSPITAL_BASED_OUTPATIENT_CLINIC_OR_DEPARTMENT_OTHER): Payer: Medicaid Other | Admitting: Hematology

## 2020-10-24 DIAGNOSIS — D72829 Elevated white blood cell count, unspecified: Secondary | ICD-10-CM

## 2021-08-08 ENCOUNTER — Other Ambulatory Visit: Payer: Self-pay | Admitting: Orthopedic Surgery

## 2021-08-08 DIAGNOSIS — M5416 Radiculopathy, lumbar region: Secondary | ICD-10-CM

## 2021-09-04 ENCOUNTER — Other Ambulatory Visit: Payer: Medicaid Other

## 2021-09-21 ENCOUNTER — Ambulatory Visit
Admission: RE | Admit: 2021-09-21 | Discharge: 2021-09-21 | Disposition: A | Discharge status: Home / Self Care | Payer: Medicaid Other | Source: Ambulatory Visit | Attending: Orthopedic Surgery | Admitting: Orthopedic Surgery

## 2021-09-21 DIAGNOSIS — M5416 Radiculopathy, lumbar region: Secondary | ICD-10-CM

## 2021-11-22 ENCOUNTER — Other Ambulatory Visit: Payer: Self-pay

## 2021-11-22 ENCOUNTER — Ambulatory Visit: Payer: Medicaid Other | Attending: Orthopedic Surgery

## 2021-11-22 DIAGNOSIS — M6281 Muscle weakness (generalized): Secondary | ICD-10-CM

## 2021-11-22 DIAGNOSIS — R262 Difficulty in walking, not elsewhere classified: Secondary | ICD-10-CM | POA: Diagnosis present

## 2021-11-22 DIAGNOSIS — G8929 Other chronic pain: Secondary | ICD-10-CM | POA: Insufficient documentation

## 2021-11-22 DIAGNOSIS — M545 Low back pain, unspecified: Secondary | ICD-10-CM | POA: Diagnosis present

## 2021-11-22 NOTE — Therapy (Signed)
?OUTPATIENT PHYSICAL THERAPY THORACOLUMBAR EVALUATION ? ? ?Patient Name: Julie Turner ?MRN: [id number] ?DOB:1955/12/19, 66 y.o., female ?Today's Date: 11/22/2021 ? ? PT End of Session - 11/22/21 1727   ? ? Visit Number 1   ? Date for PT Re-Evaluation 01/17/22   ? Authorization Type [place] Medicaid Healthy [employer]   ? Authorization - Visit Number 1   ? PT Start Time 1402   ? PT Stop Time 1450   ? PT Time Calculation (min) 48 min   ? Activity Tolerance Patient tolerated treatment well   ? Behavior During Therapy [facility] for tasks assessed/performed   ? ?  ?  ? ?  ? ? ?Past Medical History:  ?Diagnosis Date  ? Anxiety   ? Arthritis   ? "all my joints"  ? Breast cancer, right breast ([place]) 2001  ? S/P lumpecbomy and radiation  ? Depression   ? Fibromyalgia   ? GERD (gastroesophageal reflux disease)   ? High cholesterol   ? Hypertension   ? "just when I had radiation in 2001"  ? Migraine   ? "when I don't take my ASA qd" (08/15/2014)  ? Numbness and tingling in left hand   ? Spinal headache   ? Stroke [facility]) ~ 2013  ? "silent stroke" per her neurologist  ? ?Past Surgical History:  ?Procedure Laterality Date  ? BREAST LUMPECTOMY Right 2001  ? BREAST LUMPECTOMY Right   ? 2nd time w/"all the lymph nodes under my arm"  ? BREAST RECONSTRUCTION Bilateral   ? [place]; 1988  ? CHOLECYSTECTOMY  2001  ? [place] OF UTERUS  2001  ? FOOT SURGERY Left ~ 2012  ? joint cleaned out  ? JOINT REPLACEMENT    ? TOTAL HIP ARTHROPLASTY Right 08/15/2014  ? TOTAL HIP ARTHROPLASTY Right 08/15/2014  ? Procedure: RIGHT TOTAL HIP ARTHROPLASTY;  Surgeon: Kerin Salen, MD;  Location: [facility];  Service: Orthopedics;  Laterality: Right;  ? ?Patient Active Problem List  ? Diagnosis Date Noted  ? Low back pain 11/22/2021  ? Leg cramps 01/18/2019  ? Decreased exercise tolerance 01/18/2019  ? Arthritis of right hip 08/15/2014  ? ? ?PCP: Vernie Shanks, MD ? ?REFERRING PROVIDER: Frederik Pear, MD ? ?REFERRING DIAG: Low Back Pain Lumbar Spine   ? ?THERAPY DIAG:  ?Low back pain, spasm and cramp, generalized weakness ? ?ONSET DATE: 11/20/2021  ? ?SUBJECTIVE:                                                                                                                                                                                          ? ?  SUBJECTIVE STATEMENT: ?Patient states she has been hurting for years.  She has experienced upper and lower back pain but is now having leg pain and cramps in both legs.  She recently has felt that she is now unable to go on with her exercise regimen including walking due to the pain increase.  She shares multiple medical issues including a fall about 5 years ago where she fractured bilateral patella.  She states she has pain in her neck and upper back as well as the low back and bilateral leg pain.  She was formerly walking about a mile and a half before the pain in her legs became worse.  It was mentioned to her at one point that she may have Fibromyalgia. She explains that she was very athletic at one point but this changed over time.  We discuss her medication list and that there are multiple psych meds.  We discuss whether the meds were initiated before the pain and if there was an event that may have set off her pain associated with these meds.  She admits there was some trauma but did not share any details.  She would very much like to resume being fit and be able to resume her walking and higher level exercise routine.  MRI findings indicate 2 level disc bulges and stenosis.  She has a diuretic listed but states she only takes this when she is bloated. ?   ?PERTINENT HISTORY:  ?[address] in [place] years ago and fractured both patella.  Non surgical intervention: 5 years ago.  ?Past Medical History:  ?Diagnosis Date  ? Anxiety    ? Arthritis    ?  "all my joints"  ? Breast cancer, right breast ([place]) 2001  ?  S/P lumpecbomy and radiation  ? Depression    ? Fibromyalgia    ? GERD (gastroesophageal reflux disease)    ?  High cholesterol    ? Hypertension    ?  "just when I had radiation in 2001"  ? Migraine    ?  "when I don't take my ASA qd" (08/15/2014)  ? Numbness and tingling in left hand    ? Spinal headache    ? Stroke [facility]) ~ 2013  ?  "silent stroke" per her neurologist  ? ? ?PAIN:  ?Are you having pain 2/10 Yes: Pain location: thoracic and lumbar spine ?Pain description: aching ?Aggravating factors: standing, walking ?Relieving factors: Medication, heat  ? ? ?PRECAUTIONS: Other: anxiety, depression ? ?WEIGHT BEARING RESTRICTIONS No ? ?FALLS:  ?Has patient fallen in last 6 months? No, Number of falls: 0 ? ?LIVING ENVIRONMENT: ?Lives with: lives with their son ?Lives in: House/apartment ?Stairs: Yes; External:   steps; can reach both ?Has following equipment at home: None ? ?OCCUPATION: Retired ? ?PLOF: Independent ? ?PATIENT GOALS To be able to maintain some movement and flexibility and stamina.  To be able to walk a mile or mile and a half without having to stop.   ? ? ?OBJECTIVE:  ? ?DIAGNOSTIC FINDINGS:  ?IMPRESSION: ?1. At L3-4 there is a broad-based disc bulge. Severe bilateral facet ?arthropathy. Severe spinal stenosis. ?2. At L4-5 there is a broad-based disc bulge. Severe bilateral facet ?arthropathy. Moderate spinal stenosis. Severe right subarticular ?recess stenosis. Severe right foraminal stenosis. ?3. Otherwise, lumbar spine spondylosis as described above. ?  ?  ?Electronically Signed ?  By: Kathreen Devoid M.D. ?  On: 09/22/2021 15:25 ? ?PATIENT SURVEYS:  ?FOTO 31 ? ?SCREENING FOR RED FLAGS: ?Bowel or  bladder incontinence: No ?Spinal tumors: No ?Cauda equina syndrome: No ?Compression fracture: No ?Abdominal aneurysm: No ? ?COGNITION: ? Overall cognitive status: Within functional limits for tasks assessed   ?  ?SENSATION: ?WFL ? ?MUSCLE LENGTH: ?Hamstrings: Right 70 deg; Left 60 deg ?Thomas test: Right pos; Left pos ? ?POSTURE:  ?Decreased lumbar lordosis ? ?LUMBAR ROM:  ? ? A/PROM  ?11/22/2021  ?Flexion Fingertips to  distal shins  ?Extension WFL  ?Right lateral flexion Fingertips to joint line  ?Left lateral flexion Fingertips to joint line  ?Right rotation WNL  ?Left rotation WNL  ? (Blank rows = not tested) ? ?LE ROM: ? ?All WNL ? ?LE MMT: ? ?Generally 4+-5/5 throughout bilateral LE's ? ?LUMBAR SPECIAL TESTS:  ?Straight leg raise test: Negative, FABER test: Negative, and Thomas test: Positive ? ?GAIT: ?Distance walked: 50 ?Assistive device utilized: None ?Level of assistance: Complete Independence ?Comments: antalgic ? ? ? ?TODAY'S TREATMENT  ?Initial eval completed and initiated HEP ? ? ?PATIENT EDUCATION:  ?Education details: Initiated HEP ?Person educated: Patient ?Education method: Explanation, Demonstration, Verbal cues, and Handouts ?Education comprehension: verbalized understanding, returned demonstration, and verbal cues required ? ? ?HOME EXERCISE PROGRAM: ?Access Code: [email or web address] ?URL: [email or web address] ?Date: 11/22/2021 ?Prepared by: Candyce Churn ? ?Exercises ?Standing Hamstring Stretch on Chair - 2 x daily - 7 x weekly - 1 sets - 3 reps - 30 sec hold ?Standing Sports administrator with Table and Chair Support - 2 x daily - 7 x weekly - 1 sets - 3 reps - 30 sec hold ?Lying Prone with 2 Pillows - 2 x daily - 7 x weekly - 1 sets - 1 reps - 1 min hold ?Lying Prone with 1 Pillow - 2 x daily - 7 x weekly - 1 sets - 1 reps - 1 min hold ?Lying Prone - 2 x daily - 7 x weekly - 1 sets - 1 reps - 1 min hold ?Static Prone on Elbows - 2 x daily - 7 x weekly - 1 sets - 1 reps - 1 min hold ? ? ?ASSESSMENT: ? ?CLINICAL IMPRESSION: ?Patient is a 66 y.o. female who was seen today for physical therapy evaluation and treatment for low back pain. She reports multiple orthopedic issues that have resulted in her being more sedentary.  She presents with good strength in LE's but limited core strength and flexibility.  MRI reveals 2 level disc bulging and stenosis.  She is well motivated but does have some co-morbidities  including depression and anxiety that will likely impede her progress.  She would benefit from skilled PT for LE flexibility and core strength along with pain control.   ? ? ?OBJECTIVE IMPAIRMENTS difficulty walk

## 2021-11-27 ENCOUNTER — Ambulatory Visit: Payer: Medicaid Other | Admitting: Physical Therapy

## 2021-11-27 ENCOUNTER — Other Ambulatory Visit: Payer: Self-pay

## 2021-11-27 ENCOUNTER — Encounter: Payer: Self-pay | Admitting: Physical Therapy

## 2021-11-27 DIAGNOSIS — R262 Difficulty in walking, not elsewhere classified: Secondary | ICD-10-CM

## 2021-11-27 DIAGNOSIS — M545 Low back pain, unspecified: Secondary | ICD-10-CM

## 2021-11-27 DIAGNOSIS — M6281 Muscle weakness (generalized): Secondary | ICD-10-CM

## 2021-11-27 NOTE — Therapy (Signed)
?OUTPATIENT PHYSICAL THERAPY TREATMENT NOTE ? ? ?Patient Name: Julie Turner ?MRN: [id number] ?DOB:[birth year], 66 y.o., female ?Today's Date: 11/27/2021 ? ?PCP: Julie Shanks, MD ?REFERRING PROVIDER: Frederik Pear, MD ? ? PT End of Session - 11/27/21 1142   ? ? Visit Number 2   ? Date for PT Re-Evaluation 01/17/22   ? Authorization Type [place] Medicaid Healthy Blue   ? Authorization - Visit Number 2   ? PT Start Time 1145   ? PT Stop Time 1228   ? PT Time Calculation (min) 43 min   ? Activity Tolerance Patient tolerated treatment well   ? Behavior During Therapy [facility] for tasks assessed/performed   ? ?  ?  ? ?  ? ? ?Past Medical History:  ?Diagnosis Date  ? Anxiety   ? Arthritis   ? "all my joints"  ? Breast cancer, right breast ([place]) 2001  ? S/P lumpecbomy and radiation  ? Depression   ? Fibromyalgia   ? GERD (gastroesophageal reflux disease)   ? High cholesterol   ? Hypertension   ? "just when I had radiation in 2001"  ? Migraine   ? "when I don't take my ASA qd" (08/15/2014)  ? Numbness and tingling in left hand   ? Spinal headache   ? Stroke [facility]) ~ 2013  ? "silent stroke" per her neurologist  ? ?Past Surgical History:  ?Procedure Laterality Date  ? BREAST LUMPECTOMY Right 2001  ? BREAST LUMPECTOMY Right   ? 2nd time w/"all the lymph nodes under my arm"  ? BREAST RECONSTRUCTION Bilateral   ? [place]; 1988  ? CHOLECYSTECTOMY  2001  ? [place] OF UTERUS  2001  ? FOOT SURGERY Left ~ 2012  ? joint cleaned out  ? JOINT REPLACEMENT    ? TOTAL HIP ARTHROPLASTY Right 08/15/2014  ? TOTAL HIP ARTHROPLASTY Right 08/15/2014  ? Procedure: RIGHT TOTAL HIP ARTHROPLASTY;  Surgeon: Kerin Salen, MD;  Location: [facility];  Service: Orthopedics;  Laterality: Right;  ? ?Patient Active Problem List  ? Diagnosis Date Noted  ? Low back pain 11/22/2021  ? Leg cramps 01/18/2019  ? Decreased exercise tolerance 01/18/2019  ? Arthritis of right hip 08/15/2014  ? ? ?REFERRING DIAG: Low Back Pain Lumbar Spine  ? ?THERAPY DIAG:   ?Chronic midline low back pain without sciatica ? ?Muscle weakness (generalized) ? ?Difficulty in walking, not elsewhere classified ? ?PERTINENT HISTORY: [address] in [place] years ago and fractured both patella. Non surgical intervention: 5 years ago. Rt THA 2015, Other: anxiety, depression  ? ?PRECAUTIONS: none ? ?PATIENT GOALS To be able to maintain some movement and flexibility and stamina.  To be able to walk a mile or mile and a half without having to stop.    ? ?SUBJECTIVE: Julie Turner was a bad day but today is better.  My legs and neck ached last night.  I am good to try the DN today. ? ?PAIN:  ?Are you having pain Yes: 2/10 ?Pain location: thoracic and lumbar spine ?Pain description: aching ?Aggravating factors: standing, walking ?Relieving factors: Medication, heat  ? ? ?OBJECTIVE:  ?  ?DIAGNOSTIC FINDINGS:  ?IMPRESSION: ?1. At L3-4 there is a broad-based disc bulge. Severe bilateral facet ?arthropathy. Severe spinal stenosis. ?2. At L4-5 there is a broad-based disc bulge. Severe bilateral facet ?arthropathy. Moderate spinal stenosis. Severe right subarticular ?recess stenosis. Severe right foraminal stenosis. ?3. Otherwise, lumbar spine spondylosis as described above. ?  ?  ?  ?PATIENT SURVEYS:  ?  FOTO 43        ?           ?SENSATION: ?WFL ?  ?MUSCLE LENGTH: ?Hamstrings: Right 70 deg; Left 60 deg ?Thomas test: Right pos; Left pos ?  ?POSTURE:  ?Decreased lumbar lordosis ?  ?LUMBAR ROM:  ?  ?  A/PROM  ?11/22/2021  ?Flexion Fingertips to distal shins  ?Extension WFL  ?Right lateral flexion Fingertips to joint line  ?Left lateral flexion Fingertips to joint line  ?Right rotation WNL  ?Left rotation WNL  ? (Blank rows = not tested) ?  ?LE ROM: ?  ?All WNL ?  ?LE MMT: ?  ?Generally 4+-5/5 throughout bilateral LE's ?  ?LUMBAR SPECIAL TESTS:  ?Straight leg raise test: Negative, FABER test: Negative, and Thomas test: Positive ?  ?GAIT: ?Distance walked: 50 ?Assistive device utilized: None ?Level of assistance: Complete  Independence ?Comments: antalgic ?  ?  ?  ?TODAY'S TREATMENT  ?11/27/21: ?NuStep level 5 x 3', seat 7 ?Seated lumbar flexion hang x 20" ?Seated lumbar flexion and flexion with SB blue physioball rollouts 3x each way ?Seated hamstring stretch 3x10" each ?Seated piriformis stretch bil 1x20" ?Seated fig 4 stretch with forward trunk fold 1x20" bil ?Standing row green tband x 20 reps ?Trigger Point Dry-Needling  ?Treatment instructions: Expect mild to moderate muscle soreness. S/S of pneumothorax if dry needled over a lung field, and to seek immediate medical attention should they occur. Patient verbalized understanding of these instructions and education. ? ?Patient Consent Given: Yes ?Education handout provided: Previously provided ?Muscles treated: Rt TFL, glut min, glut med, piriformis ?Electrical stimulation performed: No ?Parameters: N/A ?Treatment response/outcome: improved length and positive twitch response ?STM to all DN muscles after DN ? ?Initial eval completed and initiated HEP ?  ?  ?PATIENT EDUCATION:  ?Education details: Initiated HEP ?Person educated: Patient ?Education method: Explanation, Demonstration, Verbal cues, and Handouts ?Education comprehension: verbalized understanding, returned demonstration, and verbal cues required ?  ?  ?HOME EXERCISE PROGRAM: ?Access Code: [email or web address] ?URL: [email or web address] ?Date: 11/27/2021 ?Prepared by: Venetia Night Troy Kanouse ? ?Exercises ?Standing Hamstring Stretch on Chair - 2 x daily - 7 x weekly - 1 sets - 3 reps - 30 sec hold ?Standing Sports administrator with Table and Chair Support - 2 x daily - 7 x weekly - 1 sets - 3 reps - 30 sec hold ?Lying Prone with 2 Pillows - 2 x daily - 7 x weekly - 1 sets - 1 reps - 1 min hold ?Lying Prone with 1 Pillow - 2 x daily - 7 x weekly - 1 sets - 1 reps - 1 min hold ?Lying Prone - 2 x daily - 7 x weekly - 1 sets - 1 reps - 1 min hold ?Static Prone on Elbows - 2 x daily - 7 x weekly - 1 sets - 1 reps - 1 min hold ?Standing  Row with Anchored Resistance - 1 x daily - 7 x weekly - 1 sets - 20 reps ?Seated Piriformis Stretch - 1 x daily - 7 x weekly - 1 sets - 2 reps - 20 hold ?Seated Piriformis Stretch with Trunk Bend - 1 x daily - 7 x weekly - 1 sets - 2 reps - 20 hold ?Seated Flexion Stretch - 1 x daily - 7 x weekly - 1 sets - 2 reps - 20 hold ?Seated Flexion Stretch with Swiss Ball - 1 x daily - 7 x weekly - 1 sets - 10 reps ?Seated Thoracic Flexion and Rotation with  Swiss Ball - 1 x daily - 7 x weekly - 1 sets - 10 reps ?  ?  ?ASSESSMENT: ?  ?CLINICAL IMPRESSION: ?Progressed trunk stretches with flexion bias and LE stretches in sitting as Pt is able to find benches along walking path to find relief of LBP and Rt hip cramping.  DN targeted at Rt lateral and posterior hip today with excellent twitch and release today with report of "warmth and less pain" end of session.  Pt is more optimistic that she will be able to find relief and exercise tolerance.  Continue along POC.  ?  ?  ?OBJECTIVE IMPAIRMENTS difficulty walking, decreased ROM, decreased strength, increased fascial restrictions, increased muscle spasms, impaired flexibility, and pain.  ?  ?ACTIVITY LIMITATIONS cleaning, community activity, meal prep, laundry, yard work, and shopping.  ?  ?PERSONAL FACTORS Behavior pattern, Fitness, Past/current experiences, Time since onset of injury/illness/exacerbation, and 1 comorbidity: depression  are also affecting patient's functional outcome.  ?  ?  ?REHAB POTENTIAL: Fair due to chronic nature of patients pain.  ?  ?CLINICAL DECISION MAKING: Evolving/moderate complexity ?  ?EVALUATION COMPLEXITY: Moderate ?  ?  ?GOALS: ?Goals reviewed with patient? Yes ?  ?SHORT TERM GOALS: Target date: 12/20/2021 ?  ?Patient will be independent with initial HEP  ?Baseline: na ?Goal status: INITIAL ?  ?2.  Patient to report pain no greater than 4/10 ?Baseline: na ?Goal status: INITIAL ?  ?  ?LONG TERM GOALS: Target date: 01/17/2022 ?  ?Patient to be  independent with advanced HEP  ?Baseline: na ?Goal status: INITIAL ?  ?2.  Patient to report pain no greater than 2/10  ?Baseline: na ?Goal status: INITIAL ?  ?3.  FOTO to be 49 ?Baseline: 43 ?Goal status:

## 2021-11-29 ENCOUNTER — Other Ambulatory Visit: Payer: Self-pay

## 2021-11-29 ENCOUNTER — Ambulatory Visit: Payer: Medicaid Other

## 2021-11-29 DIAGNOSIS — M545 Low back pain, unspecified: Secondary | ICD-10-CM

## 2021-11-29 DIAGNOSIS — M6281 Muscle weakness (generalized): Secondary | ICD-10-CM

## 2021-11-29 DIAGNOSIS — R262 Difficulty in walking, not elsewhere classified: Secondary | ICD-10-CM

## 2021-11-29 NOTE — Therapy (Signed)
?OUTPATIENT PHYSICAL THERAPY TREATMENT NOTE ? ? ?Patient Name: Julie Turner ?MRN: [id number] ?DOB:[birth year], 66 y.o., female ?Today's Date: 11/29/2021 ? ?PCP: Vernie Shanks, MD ?REFERRING PROVIDER: Frederik Pear, MD ? ? PT End of Session - 11/29/21 1023   ? ? Visit Number 3   ? Date for PT Re-Evaluation 01/17/22   ? Authorization Type [place] Medicaid [employer]   ? Authorization - Visit Number 3   ? PT Start Time 1015   ? PT Stop Time 1100   ? PT Time Calculation (min) 45 min   ? Activity Tolerance Patient tolerated treatment well   ? Behavior During Therapy [facility] for tasks assessed/performed   ? ?  ?  ? ?  ? ? ?Past Medical History:  ?Diagnosis Date  ? Anxiety   ? Arthritis   ? "all my joints"  ? Breast cancer, right breast ([place]) 2001  ? S/P lumpecbomy and radiation  ? Depression   ? Fibromyalgia   ? GERD (gastroesophageal reflux disease)   ? High cholesterol   ? Hypertension   ? "just when I had radiation in 2001"  ? Migraine   ? "when I don't take my ASA qd" (08/15/2014)  ? Numbness and tingling in left hand   ? Spinal headache   ? Stroke [facility]) ~ 2013  ? "silent stroke" per her neurologist  ? ?Past Surgical History:  ?Procedure Laterality Date  ? BREAST LUMPECTOMY Right 2001  ? BREAST LUMPECTOMY Right   ? 2nd time w/"all the lymph nodes under my arm"  ? BREAST RECONSTRUCTION Bilateral   ? [place]; 1988  ? CHOLECYSTECTOMY  2001  ? [place] OF UTERUS  2001  ? FOOT SURGERY Left ~ 2012  ? joint cleaned out  ? JOINT REPLACEMENT    ? TOTAL HIP ARTHROPLASTY Right 08/15/2014  ? TOTAL HIP ARTHROPLASTY Right 08/15/2014  ? Procedure: RIGHT TOTAL HIP ARTHROPLASTY;  Surgeon: Kerin Salen, MD;  Location: [place];  Service: Orthopedics;  Laterality: Right;  ? ?Patient Active Problem List  ? Diagnosis Date Noted  ? Low back pain 11/22/2021  ? Leg cramps 01/18/2019  ? Decreased exercise tolerance 01/18/2019  ? Arthritis of right hip 08/15/2014  ? ? ?REFERRING DIAG: Low Back Pain Lumbar Spine  ? ?THERAPY DIAG:   ?Chronic midline low back pain without sciatica ? ?Muscle weakness (generalized) ? ?Difficulty in walking, not elsewhere classified ? ?PERTINENT HISTORY: [facility] in [place] years ago and fractured both patella. Non surgical intervention: 5 years ago. Rt THA 2015, Other: anxiety, depression  ? ?PRECAUTIONS: none ? ?PATIENT GOALS To be able to maintain some movement and flexibility and stamina.  To be able to walk a mile or mile and a half without having to stop.    ? ?SUBJECTIVE: Patient states she woke up flat on her back and could hardly move upon start up but she is loosened up now.  She felt the DN was extremely helpful and would like to do this again.   ? ?PAIN:  ?Are you having pain Yes: 2/10 ?Pain location: thoracic and lumbar spine ?Pain description: aching ?Aggravating factors: standing, walking ?Relieving factors: Medication, heat  ? ? ?OBJECTIVE:  ?  ?DIAGNOSTIC FINDINGS:  ?IMPRESSION: ?1. At L3-4 there is a broad-based disc bulge. Severe bilateral facet ?arthropathy. Severe spinal stenosis. ?2. At L4-5 there is a broad-based disc bulge. Severe bilateral facet ?arthropathy. Moderate spinal stenosis. Severe right subarticular ?recess stenosis. Severe right foraminal stenosis. ?3. Otherwise, lumbar  spine spondylosis as described above. ?  ?  ?  ?PATIENT SURVEYS:  ?FOTO 28        ?           ?SENSATION: ?WFL ?  ?MUSCLE LENGTH: ?Hamstrings: Right 70 deg; Left 60 deg ?Thomas test: Right pos; Left pos ?  ?POSTURE:  ?Decreased lumbar lordosis ?  ?LUMBAR ROM:  ?  ?  A/PROM  ?11/22/2021  ?Flexion Fingertips to distal shins  ?Extension WFL  ?Right lateral flexion Fingertips to joint line  ?Left lateral flexion Fingertips to joint line  ?Right rotation WNL  ?Left rotation WNL  ? (Blank rows = not tested) ?  ?LE ROM: ?  ?All WNL ?  ?LE MMT: ?  ?Generally 4+-5/5 throughout bilateral LE's ?  ?LUMBAR SPECIAL TESTS:  ?Straight leg raise test: Negative, FABER test: Negative, and Thomas test: Positive ?  ?GAIT: ?Distance  walked: 50 ?Assistive device utilized: None ?Level of assistance: Complete Independence ?Comments: antalgic ?  ?  ?  ?TODAY'S TREATMENT  ?11/29/21: ?NuStep level 5 x 5', seat 7 ?Seated hamstring stretch 3x10" each ?Seated piriformis stretch bil 1x20" ?Seated fig 4 stretch with forward trunk fold 1x20" bil ? ?Trigger Point Dry-Needling (Johanna Beuhring) ? ? ?Treatment instructions: Expect mild to moderate muscle soreness. S/S of pneumothorax if dry needled over a lung field, and to seek immediate medical attention should they occur. Patient verbalized understanding of these instructions and education. ? ?Patient Consent Given: Yes ?Education handout provided: Previously provided ?Muscles treated: Lumbar multifidi ?Electrical stimulation performed: No ?Parameters: N/A ?Treatment response/outcome: improved length and positive twitch response ?STM to all DN muscles after DN, thoracic paraspinal stripping, skin rolling at T/L ?Rib springing bil Gr III/IV ?Moist heat lumbar x 10' end of session ? ? ?TODAY'S TREATMENT  ?11/27/21: ?NuStep level 5 x 3', seat 7 ?Seated lumbar flexion hang x 20" ?Seated lumbar flexion and flexion with SB blue physioball rollouts 3x each way ?Seated hamstring stretch 3x10" each ?Seated piriformis stretch bil 1x20" ?Seated fig 4 stretch with forward trunk fold 1x20" bil ?Standing row green tband x 20 reps ?Trigger Point Dry-Needling  ?Treatment instructions: Expect mild to moderate muscle soreness. S/S of pneumothorax if dry needled over a lung field, and to seek immediate medical attention should they occur. Patient verbalized understanding of these instructions and education. ? ?Patient Consent Given: Yes ?Education handout provided: Previously provided ?Muscles treated: Rt TFL, glut min, glut med, piriformis ?Electrical stimulation performed: No ?Parameters: N/A ?Treatment response/outcome: improved length and positive twitch response ?STM to all DN muscles after DN ? ?Initial eval completed  and initiated HEP ?  ?  ?PATIENT EDUCATION:  ?Education details: Initiated HEP ?Person educated: Patient ?Education method: Explanation, Demonstration, Verbal cues, and Handouts ?Education comprehension: verbalized understanding, returned demonstration, and verbal cues required ?  ?  ?HOME EXERCISE PROGRAM: ?Access Code: [email or web address] ?URL: [email or web address] ?Date: 11/27/2021 ?Prepared by: Venetia Night Beuhring ? ?Exercises ?Standing Hamstring Stretch on Chair - 2 x daily - 7 x weekly - 1 sets - 3 reps - 30 sec hold ?Standing Sports administrator with Table and Chair Support - 2 x daily - 7 x weekly - 1 sets - 3 reps - 30 sec hold ?Lying Prone with 2 Pillows - 2 x daily - 7 x weekly - 1 sets - 1 reps - 1 min hold ?Lying Prone with 1 Pillow - 2 x daily - 7 x weekly - 1 sets - 1 reps - 1 min hold ?Lying Prone - 2 x  daily - 7 x weekly - 1 sets - 1 reps - 1 min hold ?Static Prone on Elbows - 2 x daily - 7 x weekly - 1 sets - 1 reps - 1 min hold ?Standing Row with Anchored Resistance - 1 x daily - 7 x weekly - 1 sets - 20 reps ?Seated Piriformis Stretch - 1 x daily - 7 x weekly - 1 sets - 2 reps - 20 hold ?Seated Piriformis Stretch with Trunk Bend - 1 x daily - 7 x weekly - 1 sets - 2 reps - 20 hold ?Seated Flexion Stretch - 1 x daily - 7 x weekly - 1 sets - 2 reps - 20 hold ?Seated Flexion Stretch with Swiss Ball - 1 x daily - 7 x weekly - 1 sets - 10 reps ?Seated Thoracic Flexion and Rotation with Swiss Ball - 1 x daily - 7 x weekly - 1 sets - 10 reps ?  ?  ?ASSESSMENT: ?  ?CLINICAL IMPRESSION: ?Patient is tolerating stretch without exacerbation of symptoms but occasionally needs reminders to avoid over focus on focused lumbar stretches.  She was encouraged to stretch those muscles attached at the pelvis as well to decrease stress to lumbar spine.  She also becomes somewhat aggressive and bounces on her stretches and needs verbal cues for correct technique.  Per Venetia Night Beurhing patient responded well to DN.  Upper  paraspinals responded greater than lower as patient is fairly atrophied there.  Overall improvement in pain after session.  She would respond well to continued skilled PT for LE flexibility, core stabilization

## 2021-12-06 ENCOUNTER — Ambulatory Visit: Payer: Medicaid Other | Admitting: Physical Therapy

## 2021-12-06 ENCOUNTER — Encounter: Payer: Self-pay | Admitting: Physical Therapy

## 2021-12-06 DIAGNOSIS — M6281 Muscle weakness (generalized): Secondary | ICD-10-CM

## 2021-12-06 DIAGNOSIS — M545 Low back pain, unspecified: Secondary | ICD-10-CM

## 2021-12-06 DIAGNOSIS — R262 Difficulty in walking, not elsewhere classified: Secondary | ICD-10-CM

## 2021-12-06 NOTE — Therapy (Signed)
?OUTPATIENT PHYSICAL THERAPY TREATMENT NOTE ? ? ?Patient Name: Julie Turner ?MRN: [id number] ?DOB:08-25-1956, 66 y.o., female ?Today's Date: 12/06/2021 ? ?PCP: Vernie Shanks, MD ?REFERRING PROVIDER: Vernie Shanks, MD ? ? PT End of Session - 12/06/21 1443   ? ? Visit Number 4   ? Number of Visits 9   ? Date for PT Re-Evaluation 01/17/22   ? Authorization Type [place] Medicaid Healthy Blue   ? Authorization Time Period authorized 8 visits 11/26/21 - 01/16/22   ? Authorization - Visit Number 4   ? Authorization - Number of Visits 0   ? PT Start Time 4166   ? PT Stop Time 1528   ? PT Time Calculation (min) 43 min   ? Activity Tolerance Patient tolerated treatment well   ? Behavior During Therapy [facility] for tasks assessed/performed   ? ?  ?  ? ?  ? ? ?Past Medical History:  ?Diagnosis Date  ? Anxiety   ? Arthritis   ? "all my joints"  ? Breast cancer, right breast ([place]) 2001  ? S/P lumpecbomy and radiation  ? Depression   ? Fibromyalgia   ? GERD (gastroesophageal reflux disease)   ? High cholesterol   ? Hypertension   ? "just when I had radiation in 2001"  ? Migraine   ? "when I don't take my ASA qd" (08/15/2014)  ? Numbness and tingling in left hand   ? Spinal headache   ? Stroke [facility]) ~ 2013  ? "silent stroke" per her neurologist  ? ?Past Surgical History:  ?Procedure Laterality Date  ? BREAST LUMPECTOMY Right 2001  ? BREAST LUMPECTOMY Right   ? 2nd time w/"all the lymph nodes under my arm"  ? BREAST RECONSTRUCTION Bilateral   ? [place]; 1988  ? CHOLECYSTECTOMY  2001  ? [place] OF UTERUS  2001  ? FOOT SURGERY Left ~ 2012  ? joint cleaned out  ? JOINT REPLACEMENT    ? TOTAL HIP ARTHROPLASTY Right 08/15/2014  ? TOTAL HIP ARTHROPLASTY Right 08/15/2014  ? Procedure: RIGHT TOTAL HIP ARTHROPLASTY;  Surgeon: Kerin Salen, MD;  Location: [place];  Service: Orthopedics;  Laterality: Right;  ? ?Patient Active Problem List  ? Diagnosis Date Noted  ? Low back pain 11/22/2021  ? Leg cramps 01/18/2019  ? Decreased  exercise tolerance 01/18/2019  ? Arthritis of right hip 08/15/2014  ? ? ?REFERRING DIAG: Low Back Pain Lumbar Spine  ? ?THERAPY DIAG:  ?Chronic midline low back pain without sciatica ? ?Muscle weakness (generalized) ? ?Difficulty in walking, not elsewhere classified ? ?PERTINENT HISTORY: [facility] in [place] years ago and fractured both patella. Non surgical intervention: 5 years ago. Rt THA 2015, Other: anxiety, depression  ? ?PRECAUTIONS: none ? ?PATIENT GOALS To be able to maintain some movement and flexibility and stamina.  To be able to walk a mile or mile and a half without having to stop.    ? ?SUBJECTIVE: I did well after last time. I continue to wake up on my back maybe b/c I use a CPAP.  My back really hurts when I sleep on my back.  I really want to strengthen my upper back. ? ?PAIN:  ?Are you having pain Yes: 0-1/10 ?Pain location: thoracic and lumbar spine ?Pain description: aching ?Aggravating factors: standing, walking ?Relieving factors: Medication, heat  ? ? ?OBJECTIVE:  ?  ?DIAGNOSTIC FINDINGS:  ?IMPRESSION: ?1. At L3-4 there is a broad-based disc bulge. Severe bilateral facet ?arthropathy. Severe  spinal stenosis. ?2. At L4-5 there is a broad-based disc bulge. Severe bilateral facet ?arthropathy. Moderate spinal stenosis. Severe right subarticular ?recess stenosis. Severe right foraminal stenosis. ?3. Otherwise, lumbar spine spondylosis as described above. ?  ?  ?  ?PATIENT SURVEYS:  ?FOTO 26        ?           ?SENSATION: ?WFL ?  ?MUSCLE LENGTH: ?Hamstrings: Right 70 deg; Left 60 deg ?Thomas test: Right pos; Left pos ?  ?POSTURE:  ?Decreased lumbar lordosis ?  ?LUMBAR ROM:  ?  ?  A/PROM  ?11/22/2021  ?Flexion Fingertips to distal shins  ?Extension WFL  ?Right lateral flexion Fingertips to joint line  ?Left lateral flexion Fingertips to joint line  ?Right rotation WNL  ?Left rotation WNL  ? (Blank rows = not tested) ?  ?LE ROM: ?  ?All WNL ?  ?LE MMT: ?  ?Generally 4+-5/5 throughout bilateral LE's ?   ?LUMBAR SPECIAL TESTS:  ?Straight leg raise test: Negative, FABER test: Negative, and Thomas test: Positive ?  ?GAIT: ?Distance walked: 50 ?Assistive device utilized: None ?Level of assistance: Complete Independence ?Comments: antalgic ?  ?  ?  ?TODAY'S TREATMENT  ?12/06/21: ?NuStep L5 x 5' seat 9 arms 10 PT present to discuss progress and plan for session ?Seated trunk rotation stretch with overpressure 1x15" Rt/Lt ?Seated diagonal trunk stretch hands on ball 2x15" each Rt/Lt ?Open book 3 rounds on each side with hold to perform inhale/exhale 2x ?Foam roller thoracic extension stretch and rolling ?Standing horiz abd green band x 10 ?Green tband standing row bil and single arm with rotation circuit x 10 cycles ?Diagonal green band chop x 15 reps each way ?3-way raise bil 2lb dumbbells x 5 cycles ?Cross body punch 2lb x 20 reps ?STM Lt thoracic paraspinals, bil thoracolumbar fascia ?Joint mobs prone thoracic PA and rib springing bil Gr III/IV ? ?11/29/21: ?NuStep level 5 x 5', seat 7 ?Seated hamstring stretch 3x10" each ?Seated piriformis stretch bil 1x20" ?Seated fig 4 stretch with forward trunk fold 1x20" bil ? ?Trigger Point Dry-Needling (Aubrielle Stroud) ? ? ?Treatment instructions: Expect mild to moderate muscle soreness. S/S of pneumothorax if dry needled over a lung field, and to seek immediate medical attention should they occur. Patient verbalized understanding of these instructions and education. ? ?Patient Consent Given: Yes ?Education handout provided: Previously provided ?Muscles treated: Lumbar multifidi ?Electrical stimulation performed: No ?Parameters: N/A ?Treatment response/outcome: improved length and positive twitch response ?STM to all DN muscles after DN, thoracic paraspinal stripping, skin rolling at T/L ?Rib springing bil Gr III/IV ?Moist heat lumbar x 10' end of session ? ? ?TODAY'S TREATMENT ? ?11/27/21: ?NuStep level 5 x 3', seat 7 ?Seated lumbar flexion hang x 20" ?Seated lumbar flexion and  flexion with SB blue physioball rollouts 3x each way ?Seated hamstring stretch 3x10" each ?Seated piriformis stretch bil 1x20" ?Seated fig 4 stretch with forward trunk fold 1x20" bil ?Standing row green tband x 20 reps ?Trigger Point Dry-Needling  ?Treatment instructions: Expect mild to moderate muscle soreness. S/S of pneumothorax if dry needled over a lung field, and to seek immediate medical attention should they occur. Patient verbalized understanding of these instructions and education. ? ?Patient Consent Given: Yes ?Education handout provided: Previously provided ?Muscles treated: Rt TFL, glut min, glut med, piriformis ?Electrical stimulation performed: No ?Parameters: N/A ?Treatment response/outcome: improved length and positive twitch response ?STM to all DN muscles after DN ? ?Initial eval completed and initiated HEP ?  ?  ?  PATIENT EDUCATION:  ?Education details: Initiated HEP ?Person educated: Patient ?Education method: Explanation, Demonstration, Verbal cues, and Handouts ?Education comprehension: verbalized understanding, returned demonstration, and verbal cues required ?  ?  ?HOME EXERCISE PROGRAM: ?Access Code: [email or web address] ?URL: [email or web address] ?Date: 11/27/2021 ?Prepared by: Venetia Night Estanislado Surgeon ? ?Exercises ?Standing Hamstring Stretch on Chair - 2 x daily - 7 x weekly - 1 sets - 3 reps - 30 sec hold ?Standing Sports administrator with Table and Chair Support - 2 x daily - 7 x weekly - 1 sets - 3 reps - 30 sec hold ?Lying Prone with 2 Pillows - 2 x daily - 7 x weekly - 1 sets - 1 reps - 1 min hold ?Lying Prone with 1 Pillow - 2 x daily - 7 x weekly - 1 sets - 1 reps - 1 min hold ?Lying Prone - 2 x daily - 7 x weekly - 1 sets - 1 reps - 1 min hold ?Static Prone on Elbows - 2 x daily - 7 x weekly - 1 sets - 1 reps - 1 min hold ?Standing Row with Anchored Resistance - 1 x daily - 7 x weekly - 1 sets - 20 reps ?Seated Piriformis Stretch - 1 x daily - 7 x weekly - 1 sets - 2 reps - 20 hold ?Seated  Piriformis Stretch with Trunk Bend - 1 x daily - 7 x weekly - 1 sets - 2 reps - 20 hold ?Seated Flexion Stretch - 1 x daily - 7 x weekly - 1 sets - 2 reps - 20 hold ?Seated Flexion Stretch with Swiss [employer] -

## 2021-12-11 ENCOUNTER — Encounter: Payer: Self-pay | Admitting: Rehabilitative and Restorative Service Providers"

## 2021-12-11 ENCOUNTER — Ambulatory Visit: Payer: Medicaid Other | Attending: Orthopedic Surgery | Admitting: Rehabilitative and Restorative Service Providers"

## 2021-12-11 DIAGNOSIS — M545 Low back pain, unspecified: Secondary | ICD-10-CM | POA: Diagnosis present

## 2021-12-11 DIAGNOSIS — G8929 Other chronic pain: Secondary | ICD-10-CM | POA: Diagnosis present

## 2021-12-11 DIAGNOSIS — R262 Difficulty in walking, not elsewhere classified: Secondary | ICD-10-CM | POA: Insufficient documentation

## 2021-12-11 DIAGNOSIS — M6281 Muscle weakness (generalized): Secondary | ICD-10-CM | POA: Insufficient documentation

## 2021-12-11 NOTE — Therapy (Signed)
?OUTPATIENT PHYSICAL THERAPY TREATMENT NOTE ? ? ?Patient Name: Julie Turner ?MRN: [id number] ?DOB:02-27-1956, 66 y.o., female ?Today's Date: 12/11/2021 ? ?PCP: Vernie Shanks, MD ?REFERRING PROVIDER: Vernie Shanks, MD ? ? PT End of Session - 12/11/21 1449   ? ? Visit Number 5   ? Number of Visits 9   ? Date for PT Re-Evaluation 01/17/22   ? Authorization Type [place] Medicaid Healthy [employer]   ? Authorization Time Period authorized 8 visits 11/26/21 - 01/16/22   ? Authorization - Visit Number 5   ? Authorization - Number of Visits 9   ? PT Start Time 8563   ? PT Stop Time 1525   ? PT Time Calculation (min) 40 min   ? Activity Tolerance Patient tolerated treatment well   ? Behavior During Therapy [facility] for tasks assessed/performed   ? ?  ?  ? ?  ? ? ?Past Medical History:  ?Diagnosis Date  ? Anxiety   ? Arthritis   ? "all my joints"  ? Breast cancer, right breast ([place]) 2001  ? S/P lumpecbomy and radiation  ? Depression   ? Fibromyalgia   ? GERD (gastroesophageal reflux disease)   ? High cholesterol   ? Hypertension   ? "just when I had radiation in 2001"  ? Migraine   ? "when I don't take my ASA qd" (08/15/2014)  ? Numbness and tingling in left hand   ? Spinal headache   ? Stroke [facility]) ~ 2013  ? "silent stroke" per her neurologist  ? ?Past Surgical History:  ?Procedure Laterality Date  ? BREAST LUMPECTOMY Right 2001  ? BREAST LUMPECTOMY Right   ? 2nd time w/"all the lymph nodes under my arm"  ? BREAST RECONSTRUCTION Bilateral   ? [place]; 1988  ? CHOLECYSTECTOMY  2001  ? [place] OF UTERUS  2001  ? FOOT SURGERY Left ~ 2012  ? joint cleaned out  ? JOINT REPLACEMENT    ? TOTAL HIP ARTHROPLASTY Right 08/15/2014  ? TOTAL HIP ARTHROPLASTY Right 08/15/2014  ? Procedure: RIGHT TOTAL HIP ARTHROPLASTY;  Surgeon: Kerin Salen, MD;  Location: [place];  Service: Orthopedics;  Laterality: Right;  ? ?Patient Active Problem List  ? Diagnosis Date Noted  ? Low back pain 11/22/2021  ? Leg cramps 01/18/2019  ? Decreased  exercise tolerance 01/18/2019  ? Arthritis of right hip 08/15/2014  ? ? ?REFERRING DIAG: Low Back Pain Lumbar Spine  ? ?THERAPY DIAG:  ?Chronic midline low back pain without sciatica ? ?Muscle weakness (generalized) ? ?Difficulty in walking, not elsewhere classified ? ?PERTINENT HISTORY: [facility] in [place] years ago and fractured both patella. Non surgical intervention: 5 years ago. Rt THA 2015, Other: anxiety, depression  ? ?PRECAUTIONS: none ? ?PATIENT GOALS To be able to maintain some movement and flexibility and stamina.  To be able to walk a mile or mile and a half without having to stop.    ? ?SUBJECTIVE: Pt reports some back pain after lifting the 10 pound bag of sugar and other groceries while doing her grocery shopping. ? ?PAIN:  ?Are you having pain Yes: 4/10 ?Pain location: thoracic and lumbar spine ?Pain description: aching ?Aggravating factors: standing, walking ?Relieving factors: Medication, heat  ? ? ?OBJECTIVE:  ?  ?DIAGNOSTIC FINDINGS:  ?IMPRESSION: ?1. At L3-4 there is a broad-based disc bulge. Severe bilateral facet ?arthropathy. Severe spinal stenosis. ?2. At L4-5 there is a broad-based disc bulge. Severe bilateral facet ?arthropathy. Moderate spinal stenosis. Severe  right subarticular ?recess stenosis. Severe right foraminal stenosis. ?3. Otherwise, lumbar spine spondylosis as described above. ?  ?  ?  ?PATIENT SURVEYS:  ?11/22/2021: FOTO 43%     ?           ?SENSATION: ?WFL ?  ?MUSCLE LENGTH: ?11/22/2021: ?Hamstrings: Right 70 deg; Left 60 deg ?Thomas test: Right pos; Left pos ?  ?POSTURE:  ?Decreased lumbar lordosis ?  ?LUMBAR ROM:  ?  ?  A/PROM  ?11/22/2021  ?Flexion Fingertips to distal shins  ?Extension WFL  ?Right lateral flexion Fingertips to joint line  ?Left lateral flexion Fingertips to joint line  ?Right rotation WNL  ?Left rotation WNL  ? (Blank rows = not tested) ?  ?LE ROM: ?  ?All WNL ?  ?LE MMT: ?  ?Generally 4+-5/5 throughout bilateral LE's ?  ?LUMBAR SPECIAL TESTS:  ?Straight leg  raise test: Negative, FABER test: Negative, and Thomas test: Positive ?  ?GAIT: ?Distance walked: 50 ?Assistive device utilized: None ?Level of assistance: Complete Independence ?Comments: antalgic ?  ?  ?  ?TODAY'S TREATMENT  ? ?12/11/2021: ?NuStep L5 x 5' seat 9 arms 10 PT present to discuss progress and plan for session ?Seated 3 way green pball rollout 5 x10 sec hold each ?Shoulder ER, horizontal abduction, rows with green tband 2x10 bilat ?DRY NEEDLING: ?Dry needling consent given? yes ?Educational handouts provided? yes ?Muscles treated: left lumbar multifidi and left piriformis ?Response from dry needling: muscle twitch illicited, muscle elongation noted ?Seated hamstring stretch 2x20 sec bilat ?Seated piriformis stretch 2x20 sec bilat ? ?12/06/21: ?NuStep L5 x 5' seat 9 arms 10 PT present to discuss progress and plan for session ?Seated trunk rotation stretch with overpressure 1x15" Rt/Lt ?Seated diagonal trunk stretch hands on ball 2x15" each Rt/Lt ?Open book 3 rounds on each side with hold to perform inhale/exhale 2x ?Foam roller thoracic extension stretch and rolling ?Standing horiz abd green band x 10 ?Green tband standing row bil and single arm with rotation circuit x 10 cycles ?Diagonal green band chop x 15 reps each way ?3-way raise bil 2lb dumbbells x 5 cycles ?Cross body punch 2lb x 20 reps ?STM Lt thoracic paraspinals, bil thoracolumbar fascia ?Joint mobs prone thoracic PA and rib springing bil Gr III/IV ? ?11/29/21: ?NuStep level 5 x 5', seat 7 ?Seated hamstring stretch 3x10" each ?Seated piriformis stretch bil 1x20" ?Seated fig 4 stretch with forward trunk fold 1x20" bil ? ?Trigger Point Dry-Needling (Johanna Beuhring) ? ? ?Treatment instructions: Expect mild to moderate muscle soreness. S/S of pneumothorax if dry needled over a lung field, and to seek immediate medical attention should they occur. Patient verbalized understanding of these instructions and education. ? ?Patient Consent Given:  Yes ?Education handout provided: Previously provided ?Muscles treated: Lumbar multifidi ?Electrical stimulation performed: No ?Parameters: N/A ?Treatment response/outcome: improved length and positive twitch response ?STM to all DN muscles after DN, thoracic paraspinal stripping, skin rolling at T/L ?Rib springing bil Gr III/IV ?Moist heat lumbar x 10' end of session ? ? ?  ?PATIENT EDUCATION:  ?Education details: Initiated HEP ?Person educated: Patient ?Education method: Explanation, Demonstration, Verbal cues, and Handouts ?Education comprehension: verbalized understanding, returned demonstration, and verbal cues required ?  ?  ?HOME EXERCISE PROGRAM: ?Access Code: [email or web address] ?URL: [email or web address] ?Date: 11/27/2021 ?Prepared by: Venetia Night Beuhring ? ?Exercises ?Standing Hamstring Stretch on Chair - 2 x daily - 7 x weekly - 1 sets - 3 reps - 30 sec hold ?Standing Sports administrator with Table and Chair Support - 2  x daily - 7 x weekly - 1 sets - 3 reps - 30 sec hold ?Lying Prone with 2 Pillows - 2 x daily - 7 x weekly - 1 sets - 1 reps - 1 min hold ?Lying Prone with 1 Pillow - 2 x daily - 7 x weekly - 1 sets - 1 reps - 1 min hold ?Lying Prone - 2 x daily - 7 x weekly - 1 sets - 1 reps - 1 min hold ?Static Prone on Elbows - 2 x daily - 7 x weekly - 1 sets - 1 reps - 1 min hold ?Standing Row with Anchored Resistance - 1 x daily - 7 x weekly - 1 sets - 20 reps ?Seated Piriformis Stretch - 1 x daily - 7 x weekly - 1 sets - 2 reps - 20 hold ?Seated Piriformis Stretch with Trunk Bend - 1 x daily - 7 x weekly - 1 sets - 2 reps - 20 hold ?Seated Flexion Stretch - 1 x daily - 7 x weekly - 1 sets - 2 reps - 20 hold ?Seated Flexion Stretch with Swiss Ball - 1 x daily - 7 x weekly - 1 sets - 10 reps ?Seated Thoracic Flexion and Rotation with Swiss Ball - 1 x daily - 7 x weekly - 1 sets - 10 reps ?  ?  ?ASSESSMENT: ?  ?CLINICAL IMPRESSION: ?Pt arrived with reports of increased pain, attributing it to lifting her  groceries.  Pt reports relief with dry needling and would like service again.  Pt with increased tightness and muscle spasms noted with palpation to left lumbar and piriformis region.  Pt reporting immediate relief o

## 2021-12-13 ENCOUNTER — Encounter: Payer: Self-pay | Admitting: Physical Therapy

## 2021-12-13 ENCOUNTER — Ambulatory Visit: Payer: Medicaid Other | Admitting: Physical Therapy

## 2021-12-13 DIAGNOSIS — M6281 Muscle weakness (generalized): Secondary | ICD-10-CM

## 2021-12-13 DIAGNOSIS — R262 Difficulty in walking, not elsewhere classified: Secondary | ICD-10-CM

## 2021-12-13 DIAGNOSIS — G8929 Other chronic pain: Secondary | ICD-10-CM

## 2021-12-13 DIAGNOSIS — M545 Low back pain, unspecified: Secondary | ICD-10-CM | POA: Diagnosis not present

## 2021-12-13 NOTE — Therapy (Signed)
?OUTPATIENT PHYSICAL THERAPY TREATMENT NOTE ? ? ?Patient Name: Julie Turner ?MRN: [id number] ?DOB:06-22-1956, 66 y.o., female ?Today's Date: 12/13/2021 ? ?PCP: Vernie Shanks, MD ?REFERRING PROVIDER: Vernie Shanks, MD ? ? PT End of Session - 12/13/21 1359   ? ? Visit Number 6   ? Number of Visits 9   ? Date for PT Re-Evaluation 01/17/22   ? Authorization Type [place] Medicaid Healthy Blue   ? Authorization Time Period authorized 8 visits 11/26/21 - 01/16/22   ? Authorization - Visit Number 6   ? Authorization - Number of Visits 9   ? PT Start Time 1400   ? PT Stop Time 4193   ? PT Time Calculation (min) 45 min   ? Activity Tolerance Patient tolerated treatment well   ? Behavior During Therapy [facility] for tasks assessed/performed   ? ?  ?  ? ?  ? ? ?Past Medical History:  ?Diagnosis Date  ? Anxiety   ? Arthritis   ? "all my joints"  ? Breast cancer, right breast ([place]) 2001  ? S/P lumpecbomy and radiation  ? Depression   ? Fibromyalgia   ? GERD (gastroesophageal reflux disease)   ? High cholesterol   ? Hypertension   ? "just when I had radiation in 2001"  ? Migraine   ? "when I don't take my ASA qd" (08/15/2014)  ? Numbness and tingling in left hand   ? Spinal headache   ? Stroke [facility]) ~ 2013  ? "silent stroke" per her neurologist  ? ?Past Surgical History:  ?Procedure Laterality Date  ? BREAST LUMPECTOMY Right 2001  ? BREAST LUMPECTOMY Right   ? 2nd time w/"all the lymph nodes under my arm"  ? BREAST RECONSTRUCTION Bilateral   ? [place]; 1988  ? CHOLECYSTECTOMY  2001  ? [place] OF UTERUS  2001  ? FOOT SURGERY Left ~ 2012  ? joint cleaned out  ? JOINT REPLACEMENT    ? TOTAL HIP ARTHROPLASTY Right 08/15/2014  ? TOTAL HIP ARTHROPLASTY Right 08/15/2014  ? Procedure: RIGHT TOTAL HIP ARTHROPLASTY;  Surgeon: Kerin Salen, MD;  Location: [place];  Service: Orthopedics;  Laterality: Right;  ? ?Patient Active Problem List  ? Diagnosis Date Noted  ? Low back pain 11/22/2021  ? Leg cramps 01/18/2019  ? Decreased  exercise tolerance 01/18/2019  ? Arthritis of right hip 08/15/2014  ? ? ?REFERRING DIAG: Low Back Pain Lumbar Spine  ? ?THERAPY DIAG:  ?Chronic midline low back pain without sciatica ? ?Muscle weakness (generalized) ? ?Difficulty in walking, not elsewhere classified ? ?PERTINENT HISTORY: [facility] in [place] years ago and fractured both patella. Non surgical intervention: 5 years ago. Rt THA 2015, Other: anxiety, depression  ? ?PRECAUTIONS: none ? ?PATIENT GOALS To be able to maintain some movement and flexibility and stamina.  To be able to walk a mile or mile and a half without having to stop.    ? ?SUBJECTIVE: I had a very active day yesterday and am struggling today.  I felt like my legs were going to give out on me.  ? ?PAIN:  ?Are you having pain Yes: 4/10 ?Pain location: thoracic and lumbar spine ?Pain description: aching ?Aggravating factors: standing, walking ?Relieving factors: Medication, heat  ? ? ?OBJECTIVE:  ?  ?DIAGNOSTIC FINDINGS:  ?IMPRESSION: ?1. At L3-4 there is a broad-based disc bulge. Severe bilateral facet ?arthropathy. Severe spinal stenosis. ?2. At L4-5 there is a broad-based disc bulge. Severe bilateral facet ?arthropathy.  Moderate spinal stenosis. Severe right subarticular ?recess stenosis. Severe right foraminal stenosis. ?3. Otherwise, lumbar spine spondylosis as described above. ?  ?  ?  ?PATIENT SURVEYS:  ?11/22/2021: FOTO 43%     ?           ?SENSATION: ?WFL ?  ?MUSCLE LENGTH: ?11/22/2021: ?Hamstrings: Right 70 deg; Left 60 deg ?Thomas test: Right pos; Left pos ?  ?POSTURE:  ?Decreased lumbar lordosis ?  ?LUMBAR ROM:  ?  ?  A/PROM  ?11/22/2021  ?Flexion Fingertips to distal shins  ?Extension WFL  ?Right lateral flexion Fingertips to joint line  ?Left lateral flexion Fingertips to joint line  ?Right rotation WNL  ?Left rotation WNL  ? (Blank rows = not tested) ?  ?LE ROM: ?  ?All WNL ?  ?LE MMT: ?  ?Generally 4+-5/5 throughout bilateral LE's ?  ?LUMBAR SPECIAL TESTS:  ?Straight leg raise test:  Negative, FABER test: Negative, and Thomas test: Positive ?  ?GAIT: ?Distance walked: 50 ?Assistive device utilized: None ?Level of assistance: Complete Independence ?Comments: antalgic ?  ?  ?  ?TODAY'S TREATMENT  ?12/13/21: ?NuStep L5 x 5' seat 9 arms 10 PT present to discuss symptoms ?Seated 3-way trunk stretching ball rollout 3x10" each ?Cat cow 5x5" holds each position, TC for technique ?Supine soft foam roller under pelvis in hooklying decompression x 1', then [facility] with contralateral leg extension for hip flexor stretch, alt Lt/Rt, 5x10" hold ?Supine fig 4 stretch Lt hip with soft foam roller under pelvis ?Open books x 5 each side with inhale/exale at end range ?5lb kbell modified depth dead lift intro x 10 reps, PT close monitor for form ?Seated hamstring stretch 2x20" bil ?Resisted walking 15lb cable pulleys in hands retro walk x 10 rounds ? ? ? ?12/11/2021: ?NuStep L5 x 5' seat 9 arms 10 PT present to discuss progress and plan for session ?Seated 3 way green pball rollout 5 x10 sec hold each ?Shoulder ER, horizontal abduction, rows with green tband 2x10 bilat ?DRY NEEDLING: ?Dry needling consent given? yes ?Educational handouts provided? yes ?Muscles treated: left lumbar multifidi and left piriformis ?Response from dry needling: muscle twitch illicited, muscle elongation noted ?Seated hamstring stretch 2x20 sec bilat ?Seated piriformis stretch 2x20 sec bilat ? ?12/06/21: ?NuStep L5 x 5' seat 9 arms 10 PT present to discuss progress and plan for session ?Seated trunk rotation stretch with overpressure 1x15" Rt/Lt ?Seated diagonal trunk stretch hands on ball 2x15" each Rt/Lt ?Open book 3 rounds on each side with hold to perform inhale/exhale 2x ?Foam roller thoracic extension stretch and rolling ?Standing horiz abd green band x 10 ?Green tband standing row bil and single arm with rotation circuit x 10 cycles ?Diagonal green band chop x 15 reps each way ?3-way raise bil 2lb dumbbells x 5 cycles ?Cross body punch  2lb x 20 reps ?STM Lt thoracic paraspinals, bil thoracolumbar fascia ?Joint mobs prone thoracic PA and rib springing bil Gr III/IV ? ?Trigger Point Dry-Needling (Caysen Whang) ? ? ?Treatment instructions: Expect mild to moderate muscle soreness. S/S of pneumothorax if dry needled over a lung field, and to seek immediate medical attention should they occur. Patient verbalized understanding of these instructions and education. ? ?Patient Consent Given: Yes ?Education handout provided: Previously provided ?Muscles treated: Lumbar multifidi ?Electrical stimulation performed: No ?Parameters: N/A ?Treatment response/outcome: improved length and positive twitch response ?STM to all DN muscles after DN, thoracic paraspinal stripping, skin rolling at T/L ?Rib springing bil Gr III/IV ?Moist heat lumbar x 10'  end of session ? ? ?  ?PATIENT EDUCATION:  ?Education details: Initiated HEP ?Person educated: Patient ?Education method: Explanation, Demonstration, Verbal cues, and Handouts ?Education comprehension: verbalized understanding, returned demonstration, and verbal cues required ?  ?  ?HOME EXERCISE PROGRAM: ?Access Code: [email or web address] ?URL: [email or web address] ?Date: 11/27/2021 ?Prepared by: Venetia Night Analuisa Tudor ? ?Exercises ?Standing Hamstring Stretch on Chair - 2 x daily - 7 x weekly - 1 sets - 3 reps - 30 sec hold ?Standing Sports administrator with Table and Chair Support - 2 x daily - 7 x weekly - 1 sets - 3 reps - 30 sec hold ?Lying Prone with 2 Pillows - 2 x daily - 7 x weekly - 1 sets - 1 reps - 1 min hold ?Lying Prone with 1 Pillow - 2 x daily - 7 x weekly - 1 sets - 1 reps - 1 min hold ?Lying Prone - 2 x daily - 7 x weekly - 1 sets - 1 reps - 1 min hold ?Static Prone on Elbows - 2 x daily - 7 x weekly - 1 sets - 1 reps - 1 min hold ?Standing Row with Anchored Resistance - 1 x daily - 7 x weekly - 1 sets - 20 reps ?Seated Piriformis Stretch - 1 x daily - 7 x weekly - 1 sets - 2 reps - 20 hold ?Seated Piriformis  Stretch with Trunk Bend - 1 x daily - 7 x weekly - 1 sets - 2 reps - 20 hold ?Seated Flexion Stretch - 1 x daily - 7 x weekly - 1 sets - 2 reps - 20 hold ?Seated Flexion Stretch with Swiss Ball - 1 x dai

## 2021-12-25 ENCOUNTER — Ambulatory Visit: Payer: Medicaid Other | Admitting: Rehabilitative and Restorative Service Providers"

## 2021-12-25 ENCOUNTER — Encounter: Payer: Medicaid Other | Admitting: Physical Therapy

## 2021-12-25 ENCOUNTER — Encounter: Payer: Self-pay | Admitting: Rehabilitative and Restorative Service Providers"

## 2021-12-25 DIAGNOSIS — R262 Difficulty in walking, not elsewhere classified: Secondary | ICD-10-CM

## 2021-12-25 DIAGNOSIS — G8929 Other chronic pain: Secondary | ICD-10-CM

## 2021-12-25 DIAGNOSIS — M545 Low back pain, unspecified: Secondary | ICD-10-CM | POA: Diagnosis not present

## 2021-12-25 DIAGNOSIS — M6281 Muscle weakness (generalized): Secondary | ICD-10-CM

## 2021-12-25 NOTE — Therapy (Signed)
?OUTPATIENT PHYSICAL THERAPY TREATMENT NOTE ? ? ?Patient Name: Julie Turner ?MRN: [id number] ?DOB:02/26/1956, 66 y.o., female ?Today's Date: 12/25/2021 ? ?PCP: Vernie Shanks, MD ?REFERRING PROVIDER: Frederik Pear, MD ? ? PT End of Session - 12/25/21 1400   ? ? Visit Number 7   ? Number of Visits 9   ? Date for PT Re-Evaluation 01/17/22   ? Authorization Type [place] [employer]   ? Authorization Time Period authorized 8 visits 11/26/21 - 01/16/22   ? Authorization - Visit Number 7   ? Authorization - Number of Visits 9   ? PT Start Time 1400   ? PT Stop Time 1440   ? PT Time Calculation (min) 40 min   ? Activity Tolerance Patient tolerated treatment well   ? Behavior During Therapy [facility] for tasks assessed/performed   ? ?  ?  ? ?  ? ? ?Past Medical History:  ?Diagnosis Date  ? Anxiety   ? Arthritis   ? "all my joints"  ? Breast cancer, right breast ([place]) 2001  ? S/P lumpecbomy and radiation  ? Depression   ? Fibromyalgia   ? GERD (gastroesophageal reflux disease)   ? High cholesterol   ? Hypertension   ? "just when I had radiation in 2001"  ? Migraine   ? "when I don't take my ASA qd" (08/15/2014)  ? Numbness and tingling in left hand   ? Spinal headache   ? Stroke [facility]) ~ 2013  ? "silent stroke" per her neurologist  ? ?Past Surgical History:  ?Procedure Laterality Date  ? BREAST LUMPECTOMY Right 2001  ? BREAST LUMPECTOMY Right   ? 2nd time w/"all the lymph nodes under my arm"  ? BREAST RECONSTRUCTION Bilateral   ? [place]; 1988  ? CHOLECYSTECTOMY  2001  ? [place] OF UTERUS  2001  ? FOOT SURGERY Left ~ 2012  ? joint cleaned out  ? JOINT REPLACEMENT    ? TOTAL HIP ARTHROPLASTY Right 08/15/2014  ? TOTAL HIP ARTHROPLASTY Right 08/15/2014  ? Procedure: RIGHT TOTAL HIP ARTHROPLASTY;  Surgeon: Kerin Salen, MD;  Location: [place];  Service: Orthopedics;  Laterality: Right;  ? ?Patient Active Problem List  ? Diagnosis Date Noted  ? Low back pain 11/22/2021  ? Leg cramps 01/18/2019  ? Decreased  exercise tolerance 01/18/2019  ? Arthritis of right hip 08/15/2014  ? ? ?REFERRING DIAG: Low Back Pain Lumbar Spine  ? ?THERAPY DIAG:  ?Chronic midline low back pain without sciatica ? ?Muscle weakness (generalized) ? ?Difficulty in walking, not elsewhere classified ? ?PERTINENT HISTORY: [place] [facility] in [place] years ago and fractured both patella. Non surgical intervention: 5 years ago. Rt THA 2015, Other: anxiety, depression  ? ?PRECAUTIONS: none ? ?PATIENT GOALS To be able to maintain some movement and flexibility and stamina.  To be able to walk a mile or mile and a half without having to stop.    ? ?SUBJECTIVE: I'm already tired, I have been to [employer].  Reports "my knees have really been killing me" with pain of 7/10 in her knees. ? ?PAIN:  ?Are you having pain Yes: 3/10 ?Pain location: thoracic and lumbar spine ?Pain description: aching ?Aggravating factors: standing, walking ?Relieving factors: Medication, heat  ? ? ?OBJECTIVE:  ?  ?DIAGNOSTIC FINDINGS:  ?IMPRESSION: ?1. At L3-4 there is a broad-based disc bulge. Severe bilateral facet ?arthropathy. Severe spinal stenosis. ?2. At L4-5 there is a broad-based disc bulge. Severe bilateral facet ?arthropathy. Moderate spinal  stenosis. Severe right subarticular ?recess stenosis. Severe right foraminal stenosis. ?3. Otherwise, lumbar spine spondylosis as described above. ?  ?  ?  ?PATIENT SURVEYS:  ?11/22/2021: FOTO 43%     ?           ?SENSATION: ?WFL ?  ?MUSCLE LENGTH: ?11/22/2021: ?Hamstrings: Right 70 deg; Left 60 deg ?Thomas test: Right pos; Left pos ?  ?POSTURE:  ?Decreased lumbar lordosis ?  ?LUMBAR ROM:  ?  ?  A/PROM  ?11/22/2021  ?Flexion Fingertips to distal shins  ?Extension WFL  ?Right lateral flexion Fingertips to joint line  ?Left lateral flexion Fingertips to joint line  ?Right rotation WNL  ?Left rotation WNL  ? (Blank rows = not tested) ?  ?LE ROM: ?  ?All WNL ?  ?LE MMT: ?  ?Generally 4+-5/5 throughout bilateral LE's ?  ?LUMBAR SPECIAL TESTS:  ?Straight leg  raise test: Negative, FABER test: Negative, and Thomas test: Positive ?  ?GAIT: ?Distance walked: 50 ?Assistive device utilized: None ?Level of assistance: Complete Independence ?Comments: antalgic ?  ?  ?  ?TODAY'S TREATMENT  ? ?12/25/2021: ?NuStep L5 x 6' seat 9 arms 10 PT present to discuss symptoms ?Seated 3 way green pball rollout 5x10 sec each ?Cat cow 5x5" holds each position, TC for technique ?Supine fig 4 stretch Lt hip with soft foam roller under pelvis ?Open books x 5 each side with inhale/exale at end range ?Supine bow with trunk rotation x5 each side ?4 way pelvic tilt seated on dynadisc x10 each way ?DRY NEEDLING: ?Dry needling consent given? yes ?Educational handouts provided? yes ?Muscles treated: left lumbar multifidi and left piriformis ?Response from dry needling: muscle twitch illicited, muscle elongation noted ?Seated piriformis stretch to BLE 2x20 second ? ?12/13/21: ?NuStep L5 x 5' seat 9 arms 10 PT present to discuss symptoms ?Seated 3-way trunk stretching ball rollout 3x10" each ?Cat cow 5x5" holds each position, TC for technique ?Supine soft foam roller under pelvis in hooklying decompression x 1', then [facility] with contralateral leg extension for hip flexor stretch, alt Lt/Rt, 5x10" hold ?Supine fig 4 stretch Lt hip with soft foam roller under pelvis ?Open books x 5 each side with inhale/exale at end range ?5lb kbell modified depth dead lift intro x 10 reps, PT close monitor for form ?Seated hamstring stretch 2x20" bil ?Resisted walking 15lb cable pulleys in hands retro walk x 10 rounds ? ? ? ?12/11/2021: ?NuStep L5 x 5' seat 9 arms 10 PT present to discuss progress and plan for session ?Seated 3 way green pball rollout 5 x10 sec hold each ?Shoulder ER, horizontal abduction, rows with green tband 2x10 bilat ?DRY NEEDLING: ?Dry needling consent given? yes ?Educational handouts provided? yes ?Muscles treated: left lumbar multifidi and left piriformis ?Response from dry needling: muscle twitch  illicited, muscle elongation noted ?Seated hamstring stretch 2x20 sec bilat ?Seated piriformis stretch 2x20 sec bilat ? ?  ?PATIENT EDUCATION:  ?Education details: Initiated HEP ?Person educated: Patient ?Education method: Explanation, Demonstration, Verbal cues, and Handouts ?Education comprehension: verbalized understanding, returned demonstration, and verbal cues required ?  ?  ?HOME EXERCISE PROGRAM: ?Access Code: [email or web address] ?URL: [email or web address] ?Date: 11/27/2021 ?Prepared by: Venetia Night Beuhring ? ?Exercises ?Standing Hamstring Stretch on Chair - 2 x daily - 7 x weekly - 1 sets - 3 reps - 30 sec hold ?Standing Sports administrator with Table and Chair Support - 2 x daily - 7 x weekly - 1 sets - 3 reps - 30 sec hold ?Lying Prone with 2 Pillows -  2 x daily - 7 x weekly - 1 sets - 1 reps - 1 min hold ?Lying Prone with 1 Pillow - 2 x daily - 7 x weekly - 1 sets - 1 reps - 1 min hold ?Lying Prone - 2 x daily - 7 x weekly - 1 sets - 1 reps - 1 min hold ?Static Prone on Elbows - 2 x daily - 7 x weekly - 1 sets - 1 reps - 1 min hold ?Standing Row with Anchored Resistance - 1 x daily - 7 x weekly - 1 sets - 20 reps ?Seated Piriformis Stretch - 1 x daily - 7 x weekly - 1 sets - 2 reps - 20 hold ?Seated Piriformis Stretch with Trunk Bend - 1 x daily - 7 x weekly - 1 sets - 2 reps - 20 hold ?Seated Flexion Stretch - 1 x daily - 7 x weekly - 1 sets - 2 reps - 20 hold ?Seated Flexion Stretch with Swiss Ball - 1 x daily - 7 x weekly - 1 sets - 10 reps ?Seated Thoracic Flexion and Rotation with Swiss Ball - 1 x daily - 7 x weekly - 1 sets - 10 reps ?  ?  ?ASSESSMENT: ?  ?CLINICAL IMPRESSION: ?Pt reports that she is feeling overall 40% better since starting PT and states that she does not have the same amount of cramps that she did initially.  Pt has been able to resume walking with her son at [employer]. Pt reported decreased back and hip pain following dry needling.  Pt continues to progress towards goal  related activities and would benefit from continued skilled PT to progress. ? ?OBJECTIVE IMPAIRMENTS difficulty walking, decreased ROM, decreased strength, increased fascial restrictions, increased muscle spasms, imp

## 2021-12-27 ENCOUNTER — Ambulatory Visit: Payer: Medicaid Other | Admitting: Physical Therapy

## 2021-12-27 ENCOUNTER — Encounter: Payer: Self-pay | Admitting: Physical Therapy

## 2021-12-27 DIAGNOSIS — M545 Low back pain, unspecified: Secondary | ICD-10-CM

## 2021-12-27 DIAGNOSIS — M6281 Muscle weakness (generalized): Secondary | ICD-10-CM

## 2021-12-27 NOTE — Therapy (Signed)
?OUTPATIENT PHYSICAL THERAPY TREATMENT NOTE ? ? ?Patient Name: Julie Turner ?MRN: [id number] ?DOB:[birth year], 66 y.o., female ?Today's Date: 12/27/2021 ? ?PCP: Vernie Shanks, MD ?REFERRING PROVIDER: Frederik Pear, MD ? ? PT End of Session - 12/27/21 1403   ? ? Visit Number 8   ? Number of Visits 9   ? Date for PT Re-Evaluation 01/17/22   ? Authorization Type [place] Medicaid Healthy Blue   ? Authorization Time Period authorized 8 visits 11/26/21 - 01/16/22   ? Authorization - Visit Number 8   ? Authorization - Number of Visits 9   ? PT Start Time 8502   ? PT Stop Time 7741   ? PT Time Calculation (min) 41 min   ? Activity Tolerance Patient tolerated treatment well   ? Behavior During Therapy [facility] for tasks assessed/performed   ? ?  ?  ? ?  ? ? ?Past Medical History:  ?Diagnosis Date  ? Anxiety   ? Arthritis   ? "all my joints"  ? Breast cancer, right breast ([place]) 2001  ? S/P lumpecbomy and radiation  ? Depression   ? Fibromyalgia   ? GERD (gastroesophageal reflux disease)   ? High cholesterol   ? Hypertension   ? "just when I had radiation in 2001"  ? Migraine   ? "when I don't take my ASA qd" (08/15/2014)  ? Numbness and tingling in left hand   ? Spinal headache   ? Stroke [facility]) ~ 2013  ? "silent stroke" per her neurologist  ? ?Past Surgical History:  ?Procedure Laterality Date  ? BREAST LUMPECTOMY Right 2001  ? BREAST LUMPECTOMY Right   ? 2nd time w/"all the lymph nodes under my arm"  ? BREAST RECONSTRUCTION Bilateral   ? [place]; 1988  ? CHOLECYSTECTOMY  2001  ? [place] OF UTERUS  2001  ? FOOT SURGERY Left ~ 2012  ? joint cleaned out  ? JOINT REPLACEMENT    ? TOTAL HIP ARTHROPLASTY Right 08/15/2014  ? TOTAL HIP ARTHROPLASTY Right 08/15/2014  ? Procedure: RIGHT TOTAL HIP ARTHROPLASTY;  Surgeon: Kerin Salen, MD;  Location: [place];  Service: Orthopedics;  Laterality: Right;  ? ?Patient Active Problem List  ? Diagnosis Date Noted  ? Low back pain 11/22/2021  ? Leg cramps 01/18/2019  ? Decreased  exercise tolerance 01/18/2019  ? Arthritis of right hip 08/15/2014  ? ? ?REFERRING DIAG: Low Back Pain Lumbar Spine  ? ?THERAPY DIAG:  ?Chronic midline low back pain without sciatica ? ?Muscle weakness (generalized) ? ?PERTINENT HISTORY: [place] [facility] in [place] years ago and fractured both patella. Non surgical intervention: 5 years ago. Rt THA 2015, Other: anxiety, depression  ? ?PRECAUTIONS: none ? ?PATIENT GOALS To be able to maintain some movement and flexibility and stamina.  To be able to walk a mile or mile and a half without having to stop.    ? ?SUBJECTIVE: I am making progress.  Some days I feel disconnected from my body.  I am doing the stretches and the Rt hip cramp didn't come back with my exercise walks.   ? ?PAIN:  ?Are you having pain Yes: 2/10 ?Pain location: thoracic and lumbar spine ?Pain description: aching ?Aggravating factors: standing, walking ?Relieving factors: Medication, heat  ? ? ?OBJECTIVE:  ?  ?DIAGNOSTIC FINDINGS:  ?IMPRESSION: ?1. At L3-4 there is a broad-based disc bulge. Severe bilateral facet ?arthropathy. Severe spinal stenosis. ?2. At L4-5 there is a broad-based disc bulge. Severe bilateral facet ?arthropathy.  Moderate spinal stenosis. Severe right subarticular ?recess stenosis. Severe right foraminal stenosis. ?3. Otherwise, lumbar spine spondylosis as described above. ?  ?  ?  ?PATIENT SURVEYS:  ?11/22/2021: FOTO 43%     ?           ?SENSATION: ?WFL ?  ?MUSCLE LENGTH: ?11/22/2021: ?Hamstrings: Right 70 deg; Left 60 deg ?Thomas test: Right pos; Left pos ?  ?POSTURE:  ?Decreased lumbar lordosis ?  ?LUMBAR ROM:  ?  ?  A/PROM  ?11/22/2021  ?Flexion Fingertips to distal shins  ?Extension WFL  ?Right lateral flexion Fingertips to joint line  ?Left lateral flexion Fingertips to joint line  ?Right rotation WNL  ?Left rotation WNL  ? (Blank rows = not tested) ?  ?LE ROM: ?  ?All WNL ?  ?LE MMT: ?  ?Generally 4+-5/5 throughout bilateral LE's ?  ?LUMBAR SPECIAL TESTS:  ?Straight leg raise test:  Negative, FABER test: Negative, and Thomas test: Positive ?  ?GAIT: ?Distance walked: 50 ?Assistive device utilized: None ?Level of assistance: Complete Independence ?Comments: antalgic ?  ?  ?  ?TODAY'S TREATMENT  ? ?12/27/21: ?NuStep L4 x 3' warm up PT present to discuss plan for session and progress ?Supine decompression soft foam roller under pelvis: add DKTC, SKTC with other leg extended, fig 4 stretch ?Sidelying "X stretch 2-ways, on Rt and Lt, with TC/VC to extend reach from pelvis through foot and lowest rib through fingers with inhale/exhale x 3 each position ?Manual therapy ?Prone myofascial release with and without suction cup thoracodorsal fascia ?Rib springing Lt Gr III/IV ?Broadening and stripping thoracic paraspinals ? ? ? ?12/25/2021: ?NuStep L5 x 6' seat 9 arms 10 PT present to discuss symptoms ?Seated 3 way green pball rollout 5x10 sec each ?Cat cow 5x5" holds each position, TC for technique ?Supine fig 4 stretch Lt hip with soft foam roller under pelvis ?Open books x 5 each side with inhale/exale at end range ?Supine bow with trunk rotation x5 each side ?4 way pelvic tilt seated on dynadisc x10 each way ?DRY NEEDLING: ?Dry needling consent given? yes ?Educational handouts provided? yes ?Muscles treated: left lumbar multifidi and left piriformis ?Response from dry needling: muscle twitch illicited, muscle elongation noted ?Seated piriformis stretch to BLE 2x20 second ? ?12/13/21: ?NuStep L5 x 5' seat 9 arms 10 PT present to discuss symptoms ?Seated 3-way trunk stretching ball rollout 3x10" each ?Cat cow 5x5" holds each position, TC for technique ?Supine soft foam roller under pelvis in hooklying decompression x 1', then [facility] with contralateral leg extension for hip flexor stretch, alt Lt/Rt, 5x10" hold ?Supine fig 4 stretch Lt hip with soft foam roller under pelvis ?Open books x 5 each side with inhale/exale at end range ?5lb kbell modified depth dead lift intro x 10 reps, PT close monitor for  form ?Seated hamstring stretch 2x20" bil ?Resisted walking 15lb cable pulleys in hands retro walk x 10 rounds ? ?  ?PATIENT EDUCATION:  ?Education details: Initiated HEP ?Person educated: Patient ?Education method: Explanation, Demonstration, Verbal cues, and Handouts ?Education comprehension: verbalized understanding, returned demonstration, and verbal cues required ?  ?  ?HOME EXERCISE PROGRAM: ?Access Code: [email or web address] ?URL: [email or web address] ?Date: 11/27/2021 ?Prepared by: Venetia Night Nuh Lipton ? ?Exercises ?Standing Hamstring Stretch on Chair - 2 x daily - 7 x weekly - 1 sets - 3 reps - 30 sec hold ?Standing Sports administrator with Table and Chair Support - 2 x daily - 7 x weekly - 1 sets - 3 reps - 30 sec hold ?Lying  Prone with 2 Pillows - 2 x daily - 7 x weekly - 1 sets - 1 reps - 1 min hold ?Lying Prone with 1 Pillow - 2 x daily - 7 x weekly - 1 sets - 1 reps - 1 min hold ?Lying Prone - 2 x daily - 7 x weekly - 1 sets - 1 reps - 1 min hold ?Static Prone on Elbows - 2 x daily - 7 x weekly - 1 sets - 1 reps - 1 min hold ?Standing Row with Anchored Resistance - 1 x daily - 7 x weekly - 1 sets - 20 reps ?Seated Piriformis Stretch - 1 x daily - 7 x weekly - 1 sets - 2 reps - 20 hold ?Seated Piriformis Stretch with Trunk Bend - 1 x daily - 7 x weekly - 1 sets - 2 reps - 20 hold ?Seated Flexion Stretch - 1 x daily - 7 x weekly - 1 sets - 2 reps - 20 hold ?Seated Flexion Stretch with Swiss Ball - 1 x daily - 7 x weekly - 1 sets - 10 reps ?Seated Thoracic Flexion and Rotation with Swiss Ball - 1 x daily - 7 x weekly - 1 sets - 10 reps ?  ?  ?ASSESSMENT: ?  ?CLINICAL IMPRESSION: ?Pt reports that she is feeling overall 40% better.  She is using her HEP stretches with good relief and management of pain.  She arrived with 2/10 pain and reports elimination of Rt hip cramping with exercise walks since starting PT.  PT added sidelying "X" stretch for fascial and QL elongation with TC/VC for set up and execution of  dynamic reach stretch.  Manual techniques used to address fascial tightness, joint restrictions and paraspinal tension with good tolerance to deep work.  ERO next visit with plan to request extension given Pt making good

## 2022-01-01 ENCOUNTER — Encounter: Payer: Self-pay | Admitting: Physical Therapy

## 2022-01-01 ENCOUNTER — Ambulatory Visit: Payer: Medicaid Other | Admitting: Physical Therapy

## 2022-01-01 DIAGNOSIS — M545 Low back pain, unspecified: Secondary | ICD-10-CM

## 2022-01-01 DIAGNOSIS — M6281 Muscle weakness (generalized): Secondary | ICD-10-CM

## 2022-01-01 DIAGNOSIS — R262 Difficulty in walking, not elsewhere classified: Secondary | ICD-10-CM

## 2022-01-01 NOTE — Therapy (Signed)
?OUTPATIENT PHYSICAL THERAPY TREATMENT NOTE ? ? ?Patient Name: Julie Turner ?MRN: [id number] ?DOB:04-06-1956, 66 y.o., female ?Today's Date: 01/01/2022 ? ?PCP: Vernie Shanks, MD ?REFERRING PROVIDER: Vernie Shanks, MD ? ? PT End of Session - 01/01/22 1234   ? ? Visit Number 9   ? Number of Visits 9   ? Authorization Type [place] Medicaid Healthy Blue   ? Authorization Time Period requesting 6 more visits with taper to 1x/week   ? Authorization - Visit Number 9   ? Authorization - Number of Visits 9   ? PT Start Time 1235   ? PT Stop Time 1313   ? PT Time Calculation (min) 38 min   ? Activity Tolerance Patient tolerated treatment well   ? Behavior During Therapy [facility] for tasks assessed/performed   ? ?  ?  ? ?  ? ? ?Past Medical History:  ?Diagnosis Date  ? Anxiety   ? Arthritis   ? "all my joints"  ? Breast cancer, right breast ([place]) 2001  ? S/P lumpecbomy and radiation  ? Depression   ? Fibromyalgia   ? GERD (gastroesophageal reflux disease)   ? High cholesterol   ? Hypertension   ? "just when I had radiation in 2001"  ? Migraine   ? "when I don't take my ASA qd" (08/15/2014)  ? Numbness and tingling in left hand   ? Spinal headache   ? Stroke [facility]) ~ 2013  ? "silent stroke" per her neurologist  ? ?Past Surgical History:  ?Procedure Laterality Date  ? BREAST LUMPECTOMY Right 2001  ? BREAST LUMPECTOMY Right   ? 2nd time w/"all the lymph nodes under my arm"  ? BREAST RECONSTRUCTION Bilateral   ? [place]; 1988  ? CHOLECYSTECTOMY  2001  ? [place] OF UTERUS  2001  ? FOOT SURGERY Left ~ 2012  ? joint cleaned out  ? JOINT REPLACEMENT    ? TOTAL HIP ARTHROPLASTY Right 08/15/2014  ? TOTAL HIP ARTHROPLASTY Right 08/15/2014  ? Procedure: RIGHT TOTAL HIP ARTHROPLASTY;  Surgeon: Kerin Salen, MD;  Location: [place];  Service: Orthopedics;  Laterality: Right;  ? ?Patient Active Problem List  ? Diagnosis Date Noted  ? Low back pain 11/22/2021  ? Leg cramps 01/18/2019  ? Decreased exercise tolerance 01/18/2019   ? Arthritis of right hip 08/15/2014  ? ? ?REFERRING DIAG: Low Back Pain Lumbar Spine  ? ?THERAPY DIAG:  ?Chronic midline low back pain without sciatica ? ?Muscle weakness (generalized) ? ?Difficulty in walking, not elsewhere classified ? ?PERTINENT HISTORY: [place] in [place] years ago and fractured both patella. Non surgical intervention: 5 years ago. Rt THA 2015, Other: anxiety, depression  ? ?PRECAUTIONS: none ? ?PATIENT GOALS To be able to maintain some movement and flexibility and stamina.  To be able to walk a mile or mile and a half without having to stop.    ? ?SUBJECTIVE: Overall I feel 50-60% improvement since starting PT.  I am doing my HEP.  I can walk about 1/2 mile before I need to stop and stretch and then can finish a mile.  I fear if I were done with PT now I would regress and not hold on to my improvement yet.  I still can't bend/lift without pain.  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  I have recovered mostly since this morning but the pain was bad.    ? ?PAIN:  ?Are you having pain  Yes: 3/10 ?Pain location: thoracic and lumbar spine ?Pain description: aching ?Aggravating factors: bending and lifting ?Relieving factors: Medication, heat, stretching ? ? ?OBJECTIVE:  ?  ?DIAGNOSTIC FINDINGS:  ?IMPRESSION: ?1. At L3-4 there is a broad-based disc bulge. Severe bilateral facet ?arthropathy. Severe spinal stenosis. ?2. At L4-5 there is a broad-based disc bulge. Severe bilateral facet ?arthropathy. Moderate spinal stenosis. Severe right subarticular ?recess stenosis. Severe right foraminal stenosis. ?3. Otherwise, lumbar spine spondylosis as described above. ?  ?  ?  ?PATIENT SURVEYS:  ?11/22/2021: FOTO 43%     ?           ?SENSATION: ?WFL ?  ?MUSCLE LENGTH: ?01/01/2022: ?Hamstrings: Right 80 deg; Left 80 deg ? ?  ?POSTURE:  ?Decreased lumbar lordosis ?  ?LUMBAR ROM:  ?  ?  A/PROM  ?11/22/2021  ?Flexion Fingertips to just above toes  ?Extension WFL  ?Right lateral flexion Fingertips to  joint line  ?Left lateral flexion Fingertips to joint line  ?Right rotation WNL  ?Left rotation WNL  ? (Blank rows = not tested) ?  ?LE ROM: ?All WNL ?  ?LE MMT: ?  01/01/22: 5/5 BIL LEs ? ?Eval: Generally 4+-5/5 throughout bilateral LE's ?  ?LUMBAR SPECIAL TESTS:  ?Straight leg raise test: Negative, FABER test: Negative, and Thomas test: Negative ? ?  ?GAIT: ?Distance walked: 50 ?Assistive device utilized: None ?Level of assistance: Complete Independence ?Comments: equal WB in gait, improved from antalgic at evaluation ?  ?  ?  ?TODAY'S TREATMENT  ?01/01/22: ?Sidelying "X stretch 2-ways, on Rt and Lt, with TC/VC to extend reach from pelvis through foot and lowest rib through fingers with inhale/exhale x 3 each position ?Open books x 5 reps each side ?Supine decompression soft foam roller under pelvis: add DKTC, SKTC with other leg extended, fig 4 stretch ?Supine bil shoulder horizontal abduction green tband x 10 ?Supine bil serratus press 5lb dumbbells in each UE x 10 ?Dead dugs holding 5lb x 30 ? ?01-03-22: ?NuStep L4 x 3' warm up PT present to discuss plan for session and progress ?Supine decompression soft foam roller under pelvis: add DKTC, SKTC with other leg extended, fig 4 stretch ?Sidelying "X stretch 2-ways, on Rt and Lt, with TC/VC to extend reach from pelvis through foot and lowest rib through fingers with inhale/exhale x 3 each position ?Manual therapy ?Prone myofascial release with and without suction cup thoracodorsal fascia ?Rib springing Lt Gr III/IV ?Broadening and stripping thoracic paraspinals ? ? ? ?12/25/2021: ?NuStep L5 x 6' seat 9 arms 10 PT present to discuss symptoms ?Seated 3 way green pball rollout 5x10 sec each ?Cat cow 5x5" holds each position, TC for technique ?Supine fig 4 stretch Lt hip with soft foam roller under pelvis ?Open books x 5 each side with inhale/exale at end range ?Supine bow with trunk rotation x5 each side ?4 way pelvic tilt seated on dynadisc x10 each way ?DRY  NEEDLING: ?Dry needling consent given? yes ?Educational handouts provided? yes ?Muscles treated: left lumbar multifidi and left piriformis ?Response from dry needling: muscle twitch illicited, muscle elongation noted ?Seated piriformis stretch to BLE 2x20 second ? ?  ?PATIENT EDUCATION:  ?Education details: Initiated HEP ?Person educated: Patient ?Education method: Explanation, Demonstration, Verbal cues, and Handouts ?Education comprehension: verbalized understanding, returned demonstration, and verbal cues required ?  ?  ?HOME EXERCISE PROGRAM: ?Access Code: [email or web address] ?URL: [email or web address] ?Date: 11/27/2021 ?Prepared by: Venetia Night Jazmin Ley ? ?Exercises ?Standing Hamstring Stretch on Chair - 2 x daily - 7 x weekly -  1 sets - 3 reps - 30 sec hold ?Standing Sports administrator with Table and Chair Support - 2 x daily - 7 x weekly - 1 sets - 3 reps - 30 sec hold ?Lying Prone with 2 Pillows - 2 x daily - 7 x weekly - 1 sets - 1 reps - 1 min hold ?Lying Prone with 1 Pillow - 2 x daily - 7 x weekly - 1 sets - 1 reps - 1 min hold ?Lying Prone - 2 x daily - 7 x weekly - 1 sets - 1 reps - 1 min hold ?Static Prone on Elbows - 2 x daily - 7 x weekly - 1 sets - 1 reps - 1 min hold ?Standing Row with Anchored Resistance - 1 x daily - 7 x weekly - 1 sets - 20 reps ?Seated Piriformis Stretch - 1 x daily - 7 x weekly - 1 sets - 2 reps - 20 hold ?Seated Piriformis Stretch with Trunk Bend - 1 x daily - 7 x weekly - 1 sets - 2 reps - 20 hold ?Seated Flexion Stretch - 1 x daily - 7 x weekly - 1 sets - 2 reps - 20 hold ?Seated Flexion Stretch with Swiss Ball - 1 x daily - 7 x weekly - 1 sets - 10 reps ?Seated Thoracic Flexion and Rotation with Swiss Ball - 1 x daily - 7 x weekly - 1 sets - 10 reps ?  ?  ?ASSESSMENT: ?  ?CLINICAL IMPRESSION: ?Pt reports that she is feeling 50-60% better overall.  She has met all STGs and is making steady progress toward LTGs.  She is using her HEP stretches with good relief and management of  pain.  She is able to walk 1/2 mile before needing to stretch and then is able to complete the mile.  Hamstring length is now 80 deg bil.  Pain does not reach higher than 4/10 unless she bends/lifts which can cause

## 2022-01-03 ENCOUNTER — Encounter: Payer: Medicaid Other | Admitting: Physical Therapy

## 2022-01-08 ENCOUNTER — Ambulatory Visit: Payer: Medicaid Other | Attending: Orthopedic Surgery | Admitting: Physical Therapy

## 2022-01-08 ENCOUNTER — Encounter: Payer: Self-pay | Admitting: Physical Therapy

## 2022-01-08 DIAGNOSIS — G8929 Other chronic pain: Secondary | ICD-10-CM | POA: Diagnosis present

## 2022-01-08 DIAGNOSIS — M6281 Muscle weakness (generalized): Secondary | ICD-10-CM | POA: Diagnosis present

## 2022-01-08 DIAGNOSIS — R262 Difficulty in walking, not elsewhere classified: Secondary | ICD-10-CM | POA: Diagnosis present

## 2022-01-08 DIAGNOSIS — M545 Low back pain, unspecified: Secondary | ICD-10-CM | POA: Diagnosis present

## 2022-01-08 NOTE — Therapy (Signed)
?OUTPATIENT PHYSICAL THERAPY TREATMENT NOTE ? ? ?Patient Name: Julie Turner ?MRN: [id number] ?DOB:September 06, 1956, 66 y.o., female ?Today's Date: 01/08/2022 ? ?PCP: Vernie Shanks, MD ?REFERRING PROVIDER: Frederik Pear, MD ? ? PT End of Session - 01/08/22 1233   ? ? Visit Number 10   ? Number of Visits 15   ? Date for PT Re-Evaluation 02/12/22   ? Authorization Type [place] Medicaid Healthy Blue   ? Authorization Time Period requesting 6 more visits with taper to 1x/week   ? Authorization - Visit Number 10   ? Authorization - Number of Visits 15   ? PT Start Time 1234   ? PT Stop Time 1313   ? PT Time Calculation (min) 39 min   ? Activity Tolerance Patient tolerated treatment well   ? Behavior During Therapy [facility] for tasks assessed/performed   ? ?  ?  ? ?  ? ? ?Past Medical History:  ?Diagnosis Date  ? Anxiety   ? Arthritis   ? "all my joints"  ? Breast cancer, right breast ([place]) 2001  ? S/P lumpecbomy and radiation  ? Depression   ? Fibromyalgia   ? GERD (gastroesophageal reflux disease)   ? High cholesterol   ? Hypertension   ? "just when I had radiation in 2001"  ? Migraine   ? "when I don't take my ASA qd" (08/15/2014)  ? Numbness and tingling in left hand   ? Spinal headache   ? Stroke [facility]) ~ 2013  ? "silent stroke" per her neurologist  ? ?Past Surgical History:  ?Procedure Laterality Date  ? BREAST LUMPECTOMY Right 2001  ? BREAST LUMPECTOMY Right   ? 2nd time w/"all the lymph nodes under my arm"  ? BREAST RECONSTRUCTION Bilateral   ? [place]; 1988  ? CHOLECYSTECTOMY  2001  ? [place] OF UTERUS  2001  ? FOOT SURGERY Left ~ 2012  ? joint cleaned out  ? JOINT REPLACEMENT    ? TOTAL HIP ARTHROPLASTY Right 08/15/2014  ? TOTAL HIP ARTHROPLASTY Right 08/15/2014  ? Procedure: RIGHT TOTAL HIP ARTHROPLASTY;  Surgeon: Kerin Salen, MD;  Location: [place];  Service: Orthopedics;  Laterality: Right;  ? ?Patient Active Problem List  ? Diagnosis Date Noted  ? Low back pain 11/22/2021  ? Leg cramps 01/18/2019  ?  Decreased exercise tolerance 01/18/2019  ? Arthritis of right hip 08/15/2014  ? ? ?REFERRING DIAG: Low Back Pain Lumbar Spine  ? ?THERAPY DIAG:  ?Chronic midline low back pain without sciatica ? ?Muscle weakness (generalized) ? ?PERTINENT HISTORY: [place] in [place] years ago and fractured both patella. Non surgical intervention: 5 years ago. Rt THA 2015, Other: anxiety, depression  ? ?PRECAUTIONS: none ? ?PATIENT GOALS To be able to maintain some movement and flexibility and stamina.  To be able to walk a mile or mile and a half without having to stop.    ? ?SUBJECTIVE: I did a hilly hike on Sat and really got so achy all over my back and legs.  Got worse Sun and better each day since then.  Laying down, meds and stretching helped.  I clearly overdid it.  My back muscles are weak.   ? ?PAIN:  ?Are you having pain Yes: 3/10 ?Pain location: thoracic and lumbar spine ?Pain description: aching ?Aggravating factors: bending and lifting ?Relieving factors: Medication, heat, stretching ? ? ?OBJECTIVE:  ?  ?DIAGNOSTIC FINDINGS:  ?IMPRESSION: ?1. At L3-4 there is a broad-based disc bulge. Severe bilateral  facet ?arthropathy. Severe spinal stenosis. ?2. At L4-5 there is a broad-based disc bulge. Severe bilateral facet ?arthropathy. Moderate spinal stenosis. Severe right subarticular ?recess stenosis. Severe right foraminal stenosis. ?3. Otherwise, lumbar spine spondylosis as described above. ?  ?  ?  ?PATIENT SURVEYS:  ?11/22/2021: FOTO 43%     ?           ?SENSATION: ?WFL ?  ?MUSCLE LENGTH: ?01/01/2022: ?Hamstrings: Right 80 deg; Left 80 deg ? ?  ?POSTURE:  ?Decreased lumbar lordosis ?  ?LUMBAR ROM:  ?  ?  A/PROM  ?11/22/2021  ?Flexion Fingertips to just above toes  ?Extension WFL  ?Right lateral flexion Fingertips to joint line  ?Left lateral flexion Fingertips to joint line  ?Right rotation WNL  ?Left rotation WNL  ? (Blank rows = not tested) ?  ?LE ROM: ?All WNL ?  ?LE MMT: ?  01/01/22: 5/5 BIL LEs ? ?Eval: Generally 4+-5/5  throughout bilateral LE's ?  ?LUMBAR SPECIAL TESTS:  ?Straight leg raise test: Negative, FABER test: Negative, and Thomas test: Negative ? ?  ?GAIT: ?Distance walked: 50 ?Assistive device utilized: None ?Level of assistance: Complete Independence ?Comments: equal WB in gait, improved from antalgic at evaluation ?  ?  ?  ?TODAY'S TREATMENT  ?01/08/22: ?Recumbent bike L2 x 5' PT present to discuss symptom behavior and plan for session ?Seated ball rollouts 3-way, 5 rounds ?Seated trunk rotation with overpressure 1x10" each way ?Seated 6lb diagonal deadlift to lawn mower start x 10 each way ?Supine decompression soft foam roller under pelvis: add DKTC, SKTC with other leg extended, fig 4 stretch ?Supine dead bug 5lb UE weights ?Supine bicycle with 5lb alt chest press x 20 ?Supine 5lb overhead bil UE flexion and return for upper back stretch/ab activation ?Supine bridge 1x10 ?Addaday assisted STM lower thoracic paraspinals, bil, prone ? ?01/01/22: ?Sidelying "X stretch 2-ways, on Rt and Lt, with TC/VC to extend reach from pelvis through foot and lowest rib through fingers with inhale/exhale x 3 each position ?Open books x 5 reps each side ?Supine decompression soft foam roller under pelvis: add DKTC, SKTC with other leg extended, fig 4 stretch ?Supine bil shoulder horizontal abduction green tband x 10 ?Supine bil serratus press 5lb dumbbells in each UE x 10 ?Dead dugs holding 5lb x 30 ? ?2021/12/30: ?NuStep L4 x 3' warm up PT present to discuss plan for session and progress ?Supine decompression soft foam roller under pelvis: add DKTC, SKTC with other leg extended, fig 4 stretch ?Sidelying "X stretch 2-ways, on Rt and Lt, with TC/VC to extend reach from pelvis through foot and lowest rib through fingers with inhale/exhale x 3 each position ?Manual therapy ?Prone myofascial release with and without suction cup thoracodorsal fascia ?Rib springing Lt Gr III/IV ?Broadening and stripping thoracic  paraspinals ? ? ? ?12/25/2021: ?NuStep L5 x 6' seat 9 arms 10 PT present to discuss symptoms ?Seated 3 way green pball rollout 5x10 sec each ?Cat cow 5x5" holds each position, TC for technique ?Supine fig 4 stretch Lt hip with soft foam roller under pelvis ?Open books x 5 each side with inhale/exale at end range ?Supine bow with trunk rotation x5 each side ?4 way pelvic tilt seated on dynadisc x10 each way ?DRY NEEDLING: ?Dry needling consent given? yes ?Educational handouts provided? yes ?Muscles treated: left lumbar multifidi and left piriformis ?Response from dry needling: muscle twitch illicited, muscle elongation noted ?Seated piriformis stretch to BLE 2x20 second ? ?  ?PATIENT EDUCATION:  ?Education details:  Initiated HEP ?Person educated: Patient ?Education method: Explanation, Demonstration, Verbal cues, and Handouts ?Education comprehension: verbalized understanding, returned demonstration, and verbal cues required ?  ?  ?HOME EXERCISE PROGRAM: ?Access Code: [email or web address] ?URL: [email or web address] ?Date: 11/27/2021 ?Prepared by: Venetia Night Sanskriti Greenlaw ? ?Exercises ?Standing Hamstring Stretch on Chair - 2 x daily - 7 x weekly - 1 sets - 3 reps - 30 sec hold ?Standing Sports administrator with Table and Chair Support - 2 x daily - 7 x weekly - 1 sets - 3 reps - 30 sec hold ?Lying Prone with 2 Pillows - 2 x daily - 7 x weekly - 1 sets - 1 reps - 1 min hold ?Lying Prone with 1 Pillow - 2 x daily - 7 x weekly - 1 sets - 1 reps - 1 min hold ?Lying Prone - 2 x daily - 7 x weekly - 1 sets - 1 reps - 1 min hold ?Static Prone on Elbows - 2 x daily - 7 x weekly - 1 sets - 1 reps - 1 min hold ?Standing Row with Anchored Resistance - 1 x daily - 7 x weekly - 1 sets - 20 reps ?Seated Piriformis Stretch - 1 x daily - 7 x weekly - 1 sets - 2 reps - 20 hold ?Seated Piriformis Stretch with Trunk Bend - 1 x daily - 7 x weekly - 1 sets - 2 reps - 20 hold ?Seated Flexion Stretch - 1 x daily - 7 x weekly - 1 sets - 2 reps - 20  hold ?Seated Flexion Stretch with Swiss Ball - 1 x daily - 7 x weekly - 1 sets - 10 reps ?Seated Thoracic Flexion and Rotation with Swiss Ball - 1 x daily - 7 x weekly - 1 sets - 10 reps ?  ?  ?ASSESSMENT: ?  ?CLINICAL IMPRESSION: ?Pt arrived w

## 2022-01-10 ENCOUNTER — Encounter: Payer: Medicaid Other | Admitting: Physical Therapy

## 2022-01-15 ENCOUNTER — Encounter: Payer: Self-pay | Admitting: Physical Therapy

## 2022-01-15 ENCOUNTER — Ambulatory Visit: Payer: Medicaid Other | Admitting: Physical Therapy

## 2022-01-15 ENCOUNTER — Encounter: Payer: Medicaid Other | Admitting: Rehabilitative and Restorative Service Providers"

## 2022-01-15 DIAGNOSIS — R262 Difficulty in walking, not elsewhere classified: Secondary | ICD-10-CM

## 2022-01-15 DIAGNOSIS — M545 Low back pain, unspecified: Secondary | ICD-10-CM | POA: Diagnosis not present

## 2022-01-15 DIAGNOSIS — M6281 Muscle weakness (generalized): Secondary | ICD-10-CM

## 2022-01-15 DIAGNOSIS — G8929 Other chronic pain: Secondary | ICD-10-CM

## 2022-01-15 NOTE — Therapy (Addendum)
OUTPATIENT PHYSICAL THERAPY TREATMENT NOTE   Patient Name: Julie Turner MRN: [id number] DOB:[birth year], 66 y.o., female Today's Date: 01/15/2022  PCP: Vernie Shanks, MD REFERRING PROVIDER: Frederik Pear, MD   PT End of Session - 01/15/22 1237     Visit Number 11    Number of Visits 15    Date for PT Re-Evaluation 02/12/22    Authorization Type [place] [employer]    Authorization Time Period 6 visits 5/2-6/6    Authorization - Visit Number 11    Authorization - Number of Visits 15    PT Start Time 1233    PT Stop Time 1311    PT Time Calculation (min) 38 min    Activity Tolerance Patient tolerated treatment well    Behavior During Therapy WFL for tasks assessed/performed              Past Medical History:  Diagnosis Date   Anxiety    Arthritis    "all my joints"   Breast cancer, right breast ([place]) 2001   S/P lumpecbomy and radiation   Depression    Fibromyalgia    GERD (gastroesophageal reflux disease)    High cholesterol    Hypertension    "just when I had radiation in 2001"   Migraine    "when I don't take my ASA qd" (08/15/2014)   Numbness and tingling in left hand    Spinal headache    Stroke [facility]) ~ 2013   "silent stroke" per her neurologist   Past Surgical History:  Procedure Laterality Date   BREAST LUMPECTOMY Right 2001   BREAST LUMPECTOMY Right    2nd time w/"all the lymph nodes under my arm"   BREAST RECONSTRUCTION Bilateral    CESAREAN SECTION  1987; [place]  2001   FOOT SURGERY Left ~ 2012   joint cleaned out   JOINT REPLACEMENT     TOTAL HIP ARTHROPLASTY Right 08/15/2014   TOTAL HIP ARTHROPLASTY Right 08/15/2014   Procedure: RIGHT TOTAL HIP ARTHROPLASTY;  Surgeon: Kerin Salen, MD;  Location: [place];  Service: Orthopedics;  Laterality: Right;   Patient Active Problem List   Diagnosis Date Noted   Low back pain 11/22/2021   Leg cramps 01/18/2019   Decreased exercise tolerance  01/18/2019   Arthritis of right hip 08/15/2014    REFERRING DIAG: Low Back Pain Lumbar Spine   THERAPY DIAG:  Chronic midline low back pain without sciatica  Muscle weakness (generalized)  Difficulty in walking, not elsewhere classified  PERTINENT HISTORY: [facility] in [place] years ago and fractured both patella. Non surgical intervention: 5 years ago. Rt THA 2015, Other: anxiety, depression   PRECAUTIONS: none  PATIENT GOALS To be able to maintain some movement and flexibility and stamina.  To be able to walk a mile or mile and a half without having to stop.     SUBJECTIVE: It takes me a day to recover after I go for a longer walk or exercise heavier.  Today is a better day b/c I'm recovered.    PAIN:  Are you having pain Yes: 2/10 Pain location: thoracic and lumbar spine Pain description: aching Aggravating factors: bending and lifting Relieving factors: Medication, heat, stretching   OBJECTIVE:    DIAGNOSTIC FINDINGS:  IMPRESSION: 1. At L3-4 there is a broad-based disc bulge. Severe bilateral facet arthropathy. Severe spinal stenosis. 2. At L4-5 there is a broad-based disc bulge. Severe bilateral facet  arthropathy. Moderate spinal stenosis. Severe right subarticular recess stenosis. Severe right foraminal stenosis. 3. Otherwise, lumbar spine spondylosis as described above.       PATIENT SURVEYS:  11/22/2021: FOTO 43%                SENSATION: WFL   MUSCLE LENGTH: 01/01/2022: Hamstrings: Right 80 deg; Left 80 deg    POSTURE:  Decreased lumbar lordosis   LUMBAR ROM:      A/PROM  11/22/2021  Flexion Fingertips to just above toes  Extension WFL  Right lateral flexion Fingertips to joint line  Left lateral flexion Fingertips to joint line  Right rotation WNL  Left rotation WNL   (Blank rows = not tested)   LE ROM: All WNL   LE MMT:   01/01/22: 5/5 BIL LEs  Eval: Generally 4+-5/5 throughout bilateral LE's   LUMBAR SPECIAL TESTS:  Straight leg raise  test: Negative, FABER test: Negative, and Thomas test: Negative    GAIT: Distance walked: 50 Assistive device utilized: None Level of assistance: Complete Independence Comments: equal WB in gait, improved from antalgic at evaluation       TODAY'S TREATMENT  01/15/22: UBE 2x2 L4 PT present to discuss status Standing on foam pad 4lb ankle weight march taps to 2nd step 3x5 Wall push up, shoulder tap, shoulder tap, mountain climber march Rt/Lt 4lb ankle weights 5 cycles 5lb kbell lift and reverse chop x 10 bil 5lb standing deadlift x 15  Standing T at counter 5lb kbell x 10 each  Hip cybex 25lb 1x12 each: abduction, extension, flexion march Supine hooklying overhead dumbbell pulldowns x 10 reps, 7lb Supine decompression soft foam roller under pelvis: add DKTC, SKTC with other leg   extended, fig 4 stretch  01/08/22: Recumbent bike L2 x 5' PT present to discuss symptom behavior and plan for session Seated ball rollouts 3-way, 5 rounds Seated trunk rotation with overpressure 1x10" each way Seated 6lb diagonal deadlift to lawn mower start x 10 each way Supine decompression soft foam roller under pelvis: add DKTC, SKTC with other leg extended, fig 4 stretch Supine dead bug 5lb UE weights Supine bicycle with 5lb alt chest press x 20 Supine 5lb overhead bil UE flexion and return for upper back stretch/ab activation Supine bridge 1x10 Addaday assisted STM lower thoracic paraspinals, bil, prone  01/01/22: Sidelying "X stretch 2-ways, on Rt and Lt, with TC/VC to extend reach from pelvis through foot and lowest rib through fingers with inhale/exhale x 3 each position Open books x 5 reps each side Supine decompression soft foam roller under pelvis: add DKTC, SKTC with other leg extended, fig 4 stretch Supine bil shoulder horizontal abduction green tband x 10 Supine bil serratus press 5lb dumbbells in each UE x 10 Dead dugs holding 5lb x 30    PATIENT EDUCATION:  Education details: Initiated  HEP Person educated: Patient Education method: Consulting civil engineer, Media planner, Verbal cues, and Handouts Education comprehension: verbalized understanding, returned demonstration, and verbal cues required     HOME EXERCISE PROGRAM: Access Code: [email or web address] URL: [email or web address] Date: 11/27/2021 Prepared by: Venetia Night Dream Nodal  Exercises Standing Hamstring Stretch on Chair - 2 x daily - 7 x weekly - 1 sets - 3 reps - 30 sec hold Standing Quad Stretch with Table and Chair Support - 2 x daily - 7 x weekly - 1 sets - 3 reps - 30 sec hold Lying Prone with 2 Pillows - 2 x daily - 7 x weekly - 1 sets - 1 reps -  1 min hold Lying Prone with 1 Pillow - 2 x daily - 7 x weekly - 1 sets - 1 reps - 1 min hold Lying Prone - 2 x daily - 7 x weekly - 1 sets - 1 reps - 1 min hold Static Prone on Elbows - 2 x daily - 7 x weekly - 1 sets - 1 reps - 1 min hold Standing Row with Anchored Resistance - 1 x daily - 7 x weekly - 1 sets - 20 reps Seated Piriformis Stretch - 1 x daily - 7 x weekly - 1 sets - 2 reps - 20 hold Seated Piriformis Stretch with Trunk Bend - 1 x daily - 7 x weekly - 1 sets - 2 reps - 20 hold Seated Flexion Stretch - 1 x daily - 7 x weekly - 1 sets - 2 reps - 20 hold Seated Flexion Stretch with Swiss Ball - 1 x daily - 7 x weekly - 1 sets - 10 reps Seated Thoracic Flexion and Rotation with Swiss Ball - 1 x daily - 7 x weekly - 1 sets - 10 reps     ASSESSMENT:   CLINICAL IMPRESSION: Pt reports she continues to consistently stretch daily.   She needs a day to recover after longer walks and work outs.  She has pain after 1 hour of standing for activities such as cooking and will sit for relief.  Today's session focused on progressing hip and core strength.   Pt grew fatigued and noted post-exercise soreness in lateral hips end of session.  Continue along POC.   OBJECTIVE IMPAIRMENTS difficulty walking, decreased ROM, decreased strength, increased fascial restrictions, increased  muscle spasms, impaired flexibility, and pain.    ACTIVITY LIMITATIONS cleaning, community activity, meal prep, laundry, yard work, and shopping.    PERSONAL FACTORS Behavior pattern, Fitness, Past/current experiences, Time since onset of injury/illness/exacerbation, and 1 comorbidity: depression  are also affecting patient's functional outcome.      REHAB POTENTIAL: Fair due to chronic nature of patients pain.    CLINICAL DECISION MAKING: Evolving/moderate complexity   EVALUATION COMPLEXITY: Moderate     GOALS: Goals reviewed with patient? Yes   SHORT TERM GOALS: Target date: 12/20/2021   Patient will be independent with initial HEP  Baseline: na Goal status: Goal Met   2.  Patient to report pain no greater than 4/10 Baseline: na Goal status: MET     LONG TERM GOALS: Target date: 01/17/2022   Patient to be independent with advanced HEP  Baseline: needs to work on increased strength for bending, lifting, twisting cleaning movement Goal status: ONGOING    2.  Patient to report pain no greater than 2/10  Baseline: pain is typically 4/10 or less on occassion Goal status: ONGOING   3.  FOTO to be 49 Baseline: 43 Goal status: DEFERRED   4.  Patient to be able to walk 1.5 miles without having to stop due to leg pain.   Baseline: 1/2 mile, then stretches, and goes another 1/2 mile Goal status: ONGOING   5.  Patient to report 75% improvement in function and overall symptoms.  Baseline: 50-60% improved Goal status: ONGOING       PLAN: PT FREQUENCY: 1-2x/week   PT DURATION: 8 weeks   PLANNED INTERVENTIONS: Therapeutic exercises, Therapeutic activity, Neuromuscular re-education, Balance training, Gait training, Patient/Family education, Joint mobilization, Stair training, Aquatic Therapy, Dry Needling, Electrical stimulation, Spinal mobilization, Cryotherapy, Moist heat, Taping, Traction, Ultrasound, Ionotophoresis 16m/ml Dexamethasone, and Manual therapy.  PLAN FOR  NEXT SESSION: continue spine and LE stretches, functional and core/trunk strength, manual therapy including DN as needed   Vernecia Umble, PT 01/15/22 1:12 PM   PHYSICAL THERAPY DISCHARGE SUMMARY  Visits from Start of Care: 11  Current functional level related to goals / functional outcomes: See above.  Pt did not return after last visit.   Remaining deficits: See above   Education / Equipment: HEP   Patient agrees to discharge. Patient goals were partially met. Patient is being discharged due to not returning since the last visit.  Baruch Merl, PT 05/08/22 10:48 AM      [facility] [address], [place], [place] [address] Phone # [phone] Fax [phone]

## 2022-01-17 ENCOUNTER — Encounter: Payer: Medicaid Other | Admitting: Physical Therapy

## 2022-05-13 IMAGING — MR MR LUMBAR SPINE W/O CM
4 of 5 series · 18 of 48 positions shown · non-contrast
Comparison: None.

CLINICAL DATA: Back and leg spasms, ongoing for 6-8 months. No
known injury.

EXAM:
MRI LUMBAR SPINE WITHOUT CONTRAST
TECHNIQUE: Multiplanar, multisequence MR imaging of the lumbar spine was
performed. No intravenous contrast was administered.

[Series 5: T2 · sagittal · 4.0mm · 0.73mm/px · 5 of 16 slices shown (1 of 2)]
[im 1/16]
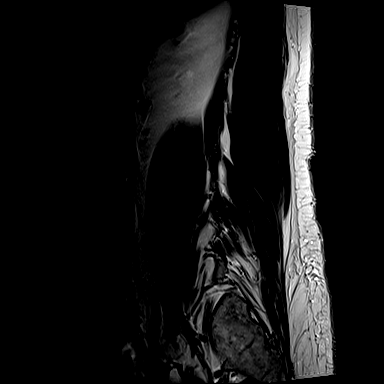
[im 4/16]
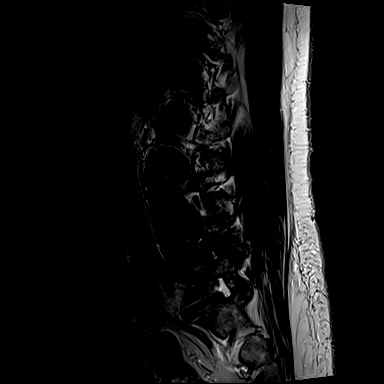
[im 8/16]
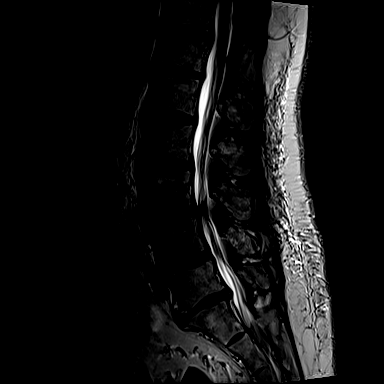
[im 12/16]
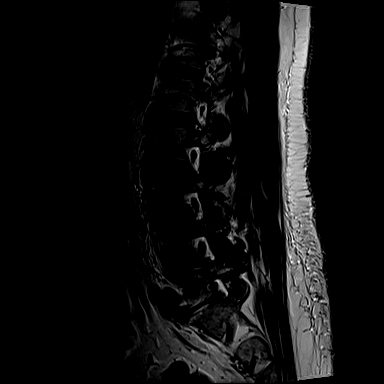
[im 16/16]
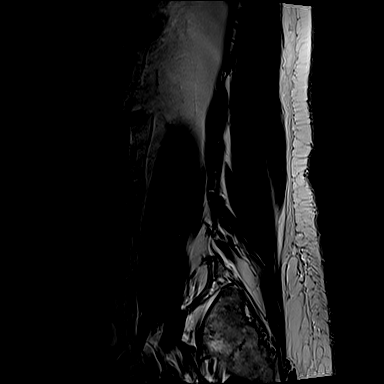

[Series 6: T1 · sagittal · 4.0mm · 0.88mm/px · 3 of 16 slices shown (1 of 2)]
[im 1/16]
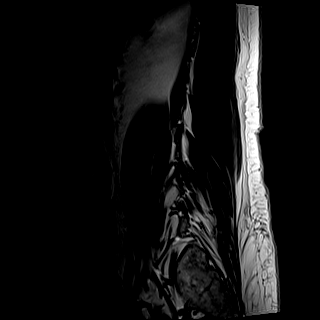
[im 8/16]
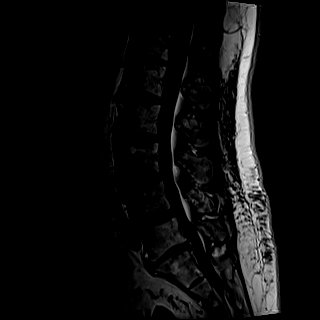
[im 16/16]
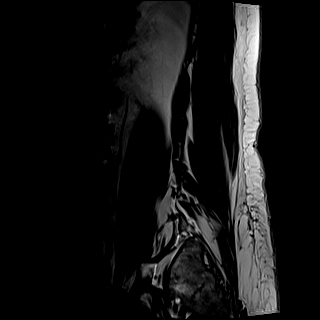

[Series 10: T1 · axial · 4.0mm · 0.28mm/px · z∈[-145,+33]mm · 3 of 44 slices shown (2 of 2)]
[im 6/44]
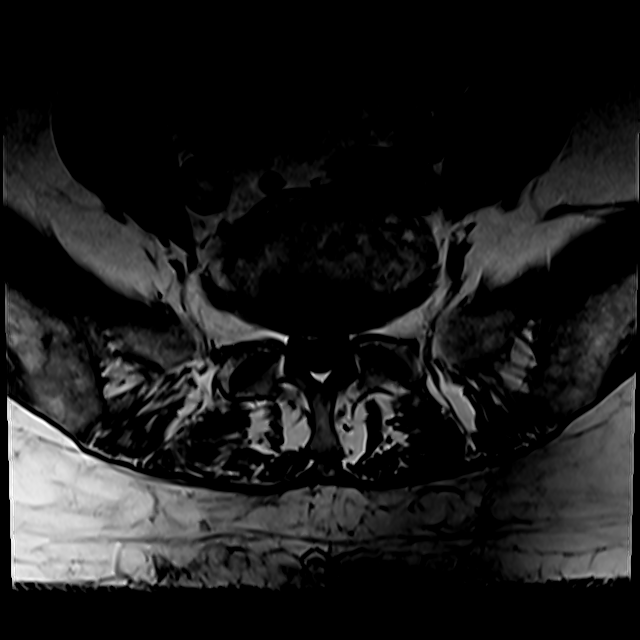
[im 23/44]
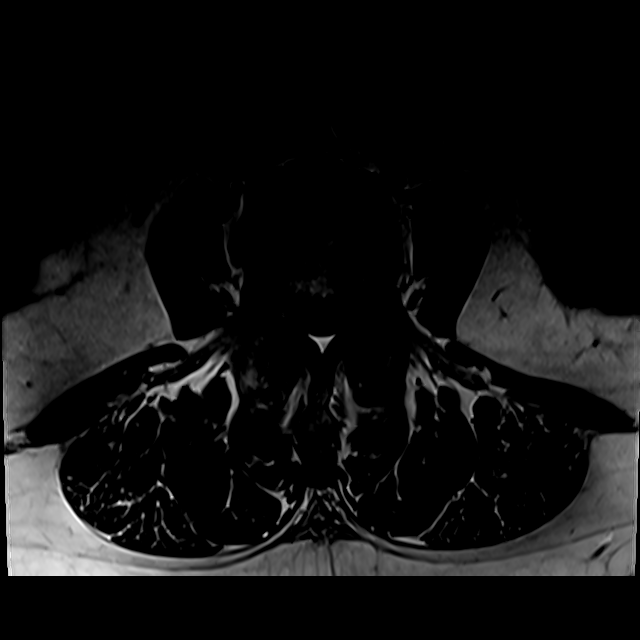
[im 38/44]
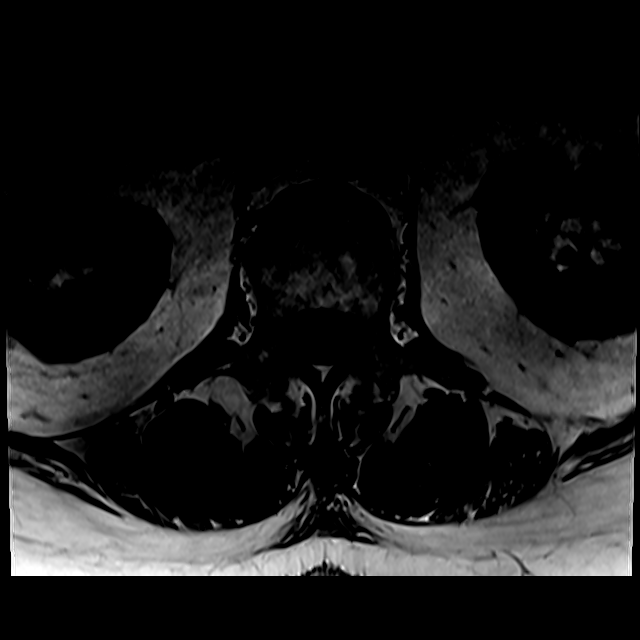

[Series 13: T2 · axial · 4.0mm · 0.28mm/px · z∈[-160,+33]mm · 7 of 44 slices shown (2 of 2)]
[im 3/44]
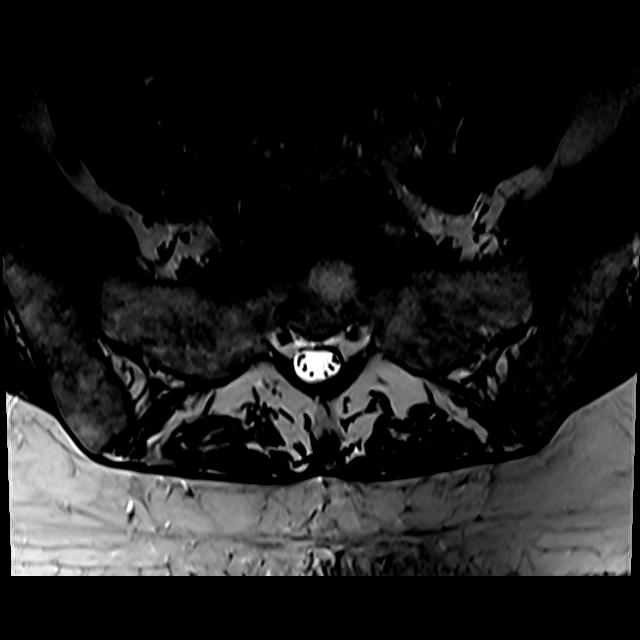
[im 6/44]
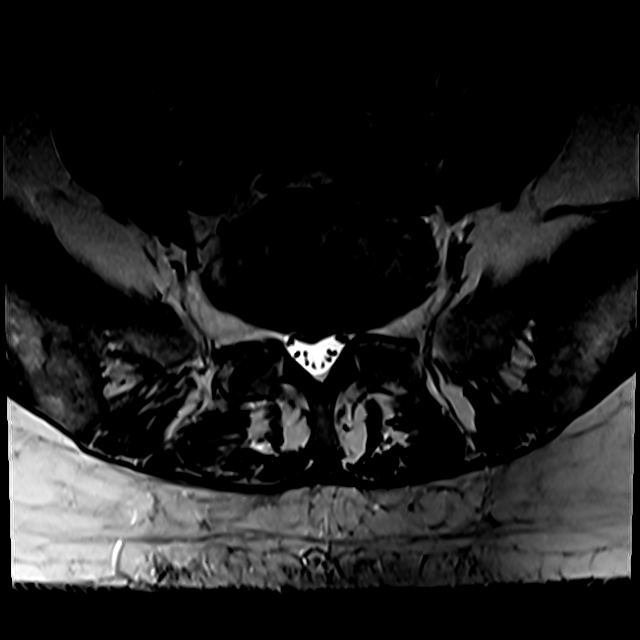
[im 9/44]
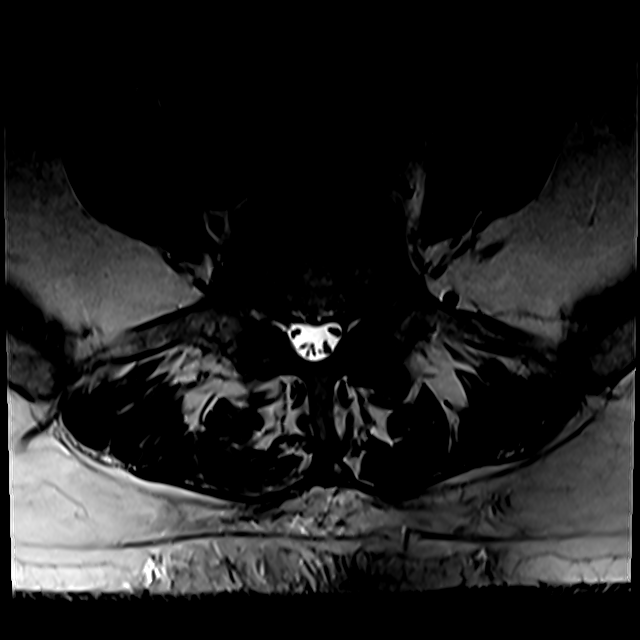
[im 15/44]
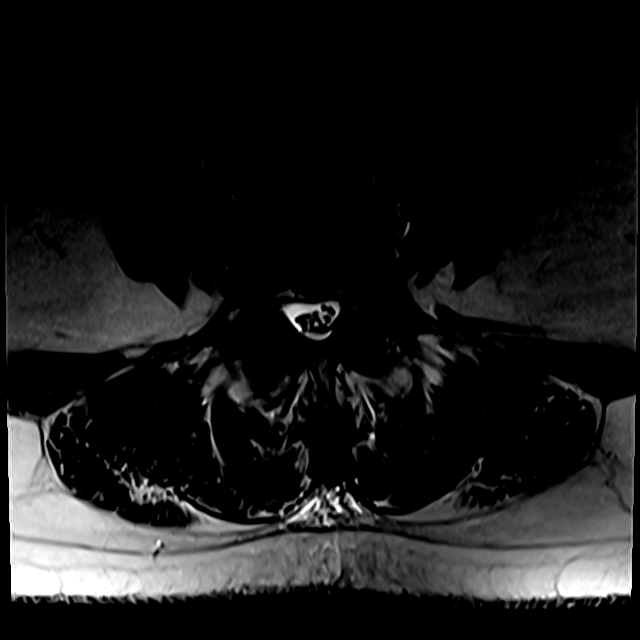
[im 21/44]
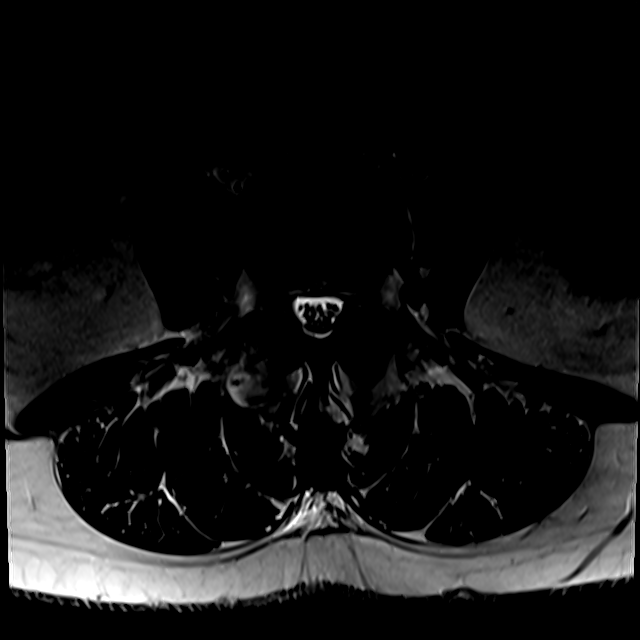
[im 23/44]
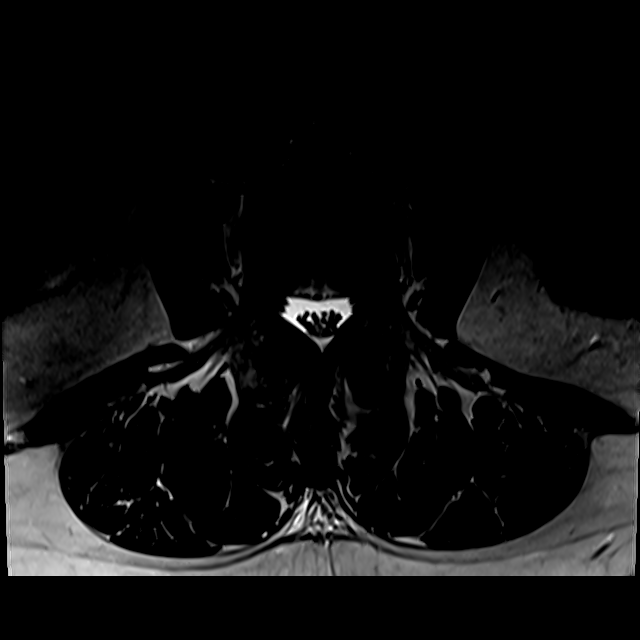
[im 38/44]
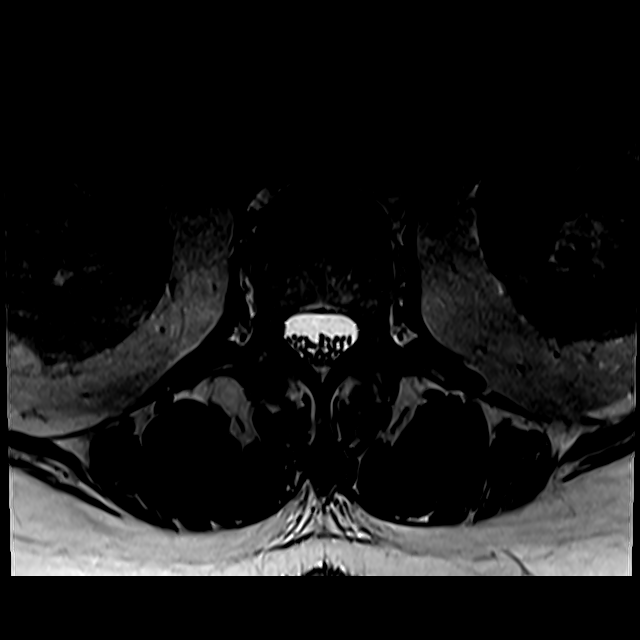

[18 of 48 positions shown; findings below may reference images not displayed]

FINDINGS: Segmentation:  Standard.

Alignment: Minimal grade 1 anterolisthesis of L3 on L4 and L4 on L5
secondary to facet disease.

Vertebrae: No acute fracture, evidence of discitis, or aggressive
bone lesion.

Conus medullaris and cauda equina: Conus extends to the T12-L1
level. Conus and cauda equina appear normal.

Paraspinal and other soft tissues: No acute paraspinal abnormality.

Disc levels:

Disc spaces: Degenerative disease with disc desiccation throughout
the thoracolumbar spine. Disc height loss at T11-12 and L5-S1.

T11-12: Broad central disc protrusion. No foraminal or central canal
stenosis.

T12-L1: No significant disc bulge. No neural foraminal stenosis. No
central canal stenosis.

L1-L2: Mild broad-based disc bulge. Mild bilateral facet
arthropathy. No foraminal or central canal stenosis.

L2-L3: Mild broad-based disc bulge. Moderate bilateral facet
arthropathy. Mild spinal stenosis. No foraminal or central canal
stenosis.

L3-L4: Broad-based disc bulge. Severe bilateral facet arthropathy.
Severe spinal stenosis. No foraminal stenosis.

L4-L5: Broad-based disc bulge. Severe bilateral facet arthropathy.
Moderate spinal stenosis. Severe right subarticular recess stenosis.
Severe right foraminal stenosis. No left foraminal stenosis.

L5-S1: Broad-based disc bulge with a small central disc protrusion.
No foraminal or central canal stenosis.
IMPRESSION: 1. At L3-4 there is a broad-based disc bulge. Severe bilateral facet
arthropathy. Severe spinal stenosis.
2. At L4-5 there is a broad-based disc bulge. Severe bilateral facet
arthropathy. Moderate spinal stenosis. Severe right subarticular
recess stenosis. Severe right foraminal stenosis.
3. Otherwise, lumbar spine spondylosis as described above.

## 2022-06-09 HISTORY — PX: CORONARY ARTERY BYPASS GRAFT: SHX141

## 2022-06-25 ENCOUNTER — Ambulatory Visit: Payer: Medicaid Other | Attending: Sports Medicine | Admitting: Physical Therapy

## 2022-06-25 DIAGNOSIS — R252 Cramp and spasm: Secondary | ICD-10-CM | POA: Insufficient documentation

## 2022-06-25 DIAGNOSIS — M542 Cervicalgia: Secondary | ICD-10-CM | POA: Insufficient documentation

## 2022-06-25 DIAGNOSIS — M5412 Radiculopathy, cervical region: Secondary | ICD-10-CM | POA: Diagnosis not present

## 2022-06-25 NOTE — Therapy (Addendum)
OUTPATIENT PHYSICAL THERAPY CERVICAL EVALUATION   Patient Name: Julie Turner MRN: [id number] DOB:[birth year], 66 y.o., female Today's Date: 06/25/2022   PT End of Session - 06/25/22 1338     Visit Number 1    Number of Visits 16   used 11 of 27 visit limit earlier this year   Date for PT Re-Evaluation 08/20/22    Authorization Type healthy blue - requesting 1x/week x 8 weeks    PT Start Time 72    PT Stop Time 1315    PT Time Calculation (min) 45 min    Activity Tolerance Patient tolerated treatment well    Behavior During Therapy WFL for tasks assessed/performed             Past Medical History:  Diagnosis Date   Anxiety    Arthritis    "all my joints"   Breast cancer, right breast ([place]) 2001   S/P lumpecbomy and radiation   Depression    Fibromyalgia    GERD (gastroesophageal reflux disease)    High cholesterol    Hypertension    "just when I had radiation in 2001"   Migraine    "when I don't take my ASA qd" (08/15/2014)   Numbness and tingling in left hand    Spinal headache    Stroke [facility]) ~ 2013   "silent stroke" per her neurologist   Past Surgical History:  Procedure Laterality Date   BREAST LUMPECTOMY Right 2001   BREAST LUMPECTOMY Right    2nd time w/"all the lymph nodes under my arm"   BREAST RECONSTRUCTION Bilateral    CESAREAN SECTION  1987; [place]  2001   FOOT SURGERY Left ~ 2012   joint cleaned out   JOINT REPLACEMENT     TOTAL HIP ARTHROPLASTY Right 08/15/2014   TOTAL HIP ARTHROPLASTY Right 08/15/2014   Procedure: RIGHT TOTAL HIP ARTHROPLASTY;  Surgeon: Kerin Salen, MD;  Location: [facility];  Service: Orthopedics;  Laterality: Right;   Patient Active Problem List   Diagnosis Date Noted   Low back pain 11/22/2021   Leg cramps 01/18/2019   Decreased exercise tolerance 01/18/2019   Arthritis of right hip 08/15/2014      REFERRING PROVIDER: Inez Catalina, MD   REFERRING DIAG:  M54.2 (ICD-10-CM) - Neck pain   THERAPY DIAG:  Radiculopathy, cervical region  Cervicalgia  Cramp and spasm  Rationale for Evaluation and Treatment Rehabilitation  ONSET DATE: 2-3 mos ago  SUBJECTIVE:  SUBJECTIVE STATEMENT: Insidious onset of Lt neck, shoulder and arm pain 2-3 mos ago.  Shooting pain down to pinky finger with numbness, can spread to other fingers.  Numbness and pins and needles in armpit, back of arm and medial elbow.  I thought I had shingles.  Took prednisone and symptoms have improved but are still present.  Taking 1/2 pill of muscle relaxer. Symptoms are intermittent. Pt is Rt hand dominant. Has had episodes like this before.    PERTINENT HISTORY:  Previous Pt for lumbar pain  PAIN:  PAIN:  Are you having pain? Yes NPRS scale: 4/10 Pain location: Lt neck, shoulder, armpit and UE Pain orientation: Left  PAIN TYPE: tingling and shooting, grabbing Pain description: constant Aggravating factors: use Lt arm, laying on Rt side bothers Lt armpit Relieving factors: holding arm overhead initially helped, has to lay on symptomatic Lt side for relief   PRECAUTIONS: None  WEIGHT BEARING RESTRICTIONS No  FALLS:  Has patient fallen in last 6 months? No  LIVING ENVIRONMENT: Lives with: lives with their son Lives in: House/apartment Stairs: yes Has following equipment at home: None  OCCUPATION: unemployed  PLOF: Independent  PATIENT GOALS no pain  OBJECTIVE:   DIAGNOSTIC FINDINGS:  Per Pt xray reveals arthritis in neck  PATIENT SURVEYS:  NDI 86/50   COGNITION: Overall cognitive status: Within functional limits for tasks assessed   SENSATION: Pins/needles and numbness reports from shoulder blade to pinky finger along posterior and medial Lt  UE  POSTURE: rounded shoulders, forward head, and Lt shoulder depressed compared to Rt, extension hinge base of neck  PALPATION: Tender and spasm present: Lt upper trap, teres minor, infraspinatus   CERVICAL ROM:   Active ROM A/PROM (deg) eval  Flexion 45, painful pulling into Lt shoulder  Extension 35  Right lateral flexion 30  Left lateral flexion 35  Right rotation 50  Left rotation 50   (Blank rows = not tested)  UPPER EXTREMITY ROM: Full ROM bil LE, no pain  UPPER EXTREMITY MMT: Bil UE strength 4+/5 grossly MMT Right eval Left eval  Grip strength 60lb 54lb   (Blank rows = not tested)  CERVICAL SPECIAL TESTS:  Upper limb tension test (ULTT): Positive bil ulnar, median and radial Spurling's test: Positive Lt closing position   TODAY'S TREATMENT:  Trigger Point Dry-Needling  Treatment instructions: Expect mild to moderate muscle soreness. S/S of pneumothorax if dry needled over a lung field, and to seek immediate medical attention should they occur. Patient verbalized understanding of these instructions and education.  Patient Consent Given: Yes Education handout provided: Previously provided (past PT here for lumbar) Muscles treated: Lt upper trap, teres minor, infraspinatus Electrical stimulation performed: No Parameters: N/A Treatment response/outcome: twitch, release and elongation, Pt reports feeling some pain into front of shoulder end of session  Check all possible CPT codes: 97140 - Manual Therapy    Check all conditions that are expected to impact treatment: None of these apply   If treatment provided at initial evaluation, no treatment charged due to lack of authorization.       PATIENT EDUCATION:  Education details: discussion of findings, reiterated aftercare for DN Person educated: Patient Education method: Explanation Education comprehension: verbalized understanding   HOME EXERCISE PROGRAM: Start next time  ASSESSMENT:  CLINICAL  IMPRESSION: Patient is a 66 y.o. female who was seen today for physical therapy evaluation and treatment for neck pain with radiating pain and numbness in Lt UE.  She has limited cervical ROM with +  Spurling's into Lt closing positions.  She reports relief with manual distraction.  She has used Lt UE overhead positioning for relief.  Symptoms have improved since onset with prednisone and muscle relaxants.  Lt UE strength is symmetrical to Rt UE with exception of grip strength, although this may be consistent with non-dominant UE given small discrepancy of strength Lt to Rt.   DN performed to Lt upper quadrant today with good twitch/release and improved Lt UE symptoms end of session.  Pt did report feeling some anterior shoulder pain end of session. Pt will benefit from skilled PT for traction, manual techniques, postural strength and nerve glides to reduce pain and improve function.    OBJECTIVE IMPAIRMENTS decreased activity tolerance, decreased ROM, decreased strength, hypomobility, increased muscle spasms, impaired flexibility, impaired sensation, impaired UE functional use, improper body mechanics, postural dysfunction, and pain.   ACTIVITY LIMITATIONS carrying, lifting, sitting, and sleeping  PARTICIPATION LIMITATIONS: cleaning, driving, shopping, community activity, and yard work  PERSONAL FACTORS Time since onset of injury/illness/exacerbation are also affecting patient's functional outcome.   REHAB POTENTIAL: Excellent  CLINICAL DECISION MAKING: Stable/uncomplicated  EVALUATION COMPLEXITY: Low   GOALS: Goals reviewed with patient? Yes  SHORT TERM GOALS: Target date: 07/23/2022   Pt will be ind with initial HEP and postural awareness self-check throughout the day. Baseline:  Goal status: INITIAL  2.  Pt will report improved pain in neck and Lt UE by at least 20% Baseline:  Goal status: INITIAL    LONG TERM GOALS: Target date: 08/20/2022  Pt will be ind with advanced HEP  and understand how to safely progress Baseline:  Goal status: INITIAL  2.  Pt will report improved neck and Lt UE pain/numbness by at least 70% Baseline:  Goal status: INITIAL  3.  Pt will improve NDI to at least 19/50 to demo improved function Baseline: 24/50 Goal status: INITIAL  4.  Pt will be able to perform daily activities with no or min neck and Lt UE pain Baseline:  Goal status: INITIAL  5.  Improved bil cervical rotation to at least 60 deg for improved visibility with driving and flexion to at least 50 deg without pain  Baseline:  Goal status: INITIAL     PLAN: PT FREQUENCY: 1x/week  PT DURATION: 8 weeks  PLANNED INTERVENTIONS: Therapeutic exercises, Therapeutic activity, Neuromuscular re-education, Patient/Family education, Self Care, Joint mobilization, Dry Needling, Electrical stimulation, Spinal mobilization, Cryotherapy, Moist heat, Taping, Traction, and Manual therapy  PLAN FOR NEXT SESSION: f/u on DN response, initiate HEP (cervical retraction/extension), mechanical cervical traction, repeat DN and mobilize neck   Alec Mcphee, PT 06/25/22 1:41 PM   PHYSICAL THERAPY DISCHARGE SUMMARY  Visits from Start of Care: 1  Current functional level related to goals / functional outcomes: Pt had a medical change of status and will be doing cardiac rehab.  She called to cancel all visits.   Remaining deficits: Eval only.  See above   Education / Equipment: No HEP given eval only.   Patient agrees to discharge. Patient goals were not met. Patient is being discharged due to a change in medical status.  Terrica Duecker, PT 08/15/22 9:24 AM

## 2022-07-04 ENCOUNTER — Encounter: Payer: Medicaid Other | Admitting: Physical Therapy

## 2022-08-08 ENCOUNTER — Encounter: Payer: Medicaid Other | Admitting: Physical Therapy

## 2022-08-09 ENCOUNTER — Encounter: Payer: Self-pay | Admitting: Cardiology

## 2022-08-09 ENCOUNTER — Ambulatory Visit: Payer: Medicaid Other | Admitting: Cardiology

## 2022-08-09 VITALS — BP 135/76 | HR 87 | Resp 16 | Ht 66.0 in | Wt 208.0 lb

## 2022-08-09 DIAGNOSIS — I2510 Atherosclerotic heart disease of native coronary artery without angina pectoris: Secondary | ICD-10-CM | POA: Insufficient documentation

## 2022-08-09 DIAGNOSIS — T466X5A Adverse effect of antihyperlipidemic and antiarteriosclerotic drugs, initial encounter: Secondary | ICD-10-CM | POA: Insufficient documentation

## 2022-08-09 DIAGNOSIS — R0789 Other chest pain: Secondary | ICD-10-CM

## 2022-08-09 DIAGNOSIS — E782 Mixed hyperlipidemia: Secondary | ICD-10-CM | POA: Insufficient documentation

## 2022-08-09 DIAGNOSIS — Z951 Presence of aortocoronary bypass graft: Secondary | ICD-10-CM | POA: Insufficient documentation

## 2022-08-09 DIAGNOSIS — I10 Essential (primary) hypertension: Secondary | ICD-10-CM | POA: Insufficient documentation

## 2022-08-09 DIAGNOSIS — I25118 Atherosclerotic heart disease of native coronary artery with other forms of angina pectoris: Secondary | ICD-10-CM | POA: Insufficient documentation

## 2022-08-09 MED ORDER — REPATHA SURECLICK 140 MG/ML ~~LOC~~ SOAJ: 140.0000 mg | SUBCUTANEOUS | 5 refills | Status: DC

## 2022-08-09 MED ORDER — CLOPIDOGREL BISULFATE 75 MG PO TABS: 75.0000 mg | ORAL_TABLET | Freq: Every day | ORAL | 3 refills | Status: DC

## 2022-08-09 MED ORDER — COLCHICINE 0.6 MG PO TABS: 0.6000 mg | ORAL_TABLET | Freq: Every day | ORAL | 3 refills | Status: DC

## 2022-08-09 NOTE — Progress Notes (Signed)
Follow up visit  Subjective:   Julie Turner, female    DOB: 04-27-1956, 66 y.o.   MRN: [id number]     HPI  Chief Complaint  Patient presents with   New Patient (Initial Visit)   Coronary Artery Disease    66 y/o Caucasian female with hypertension, hyperlipidemia, CAD s/p CABG x3 (LIMA-LAD, SVG-ramus and RCA), PAD, major depression.  Patient was saw me 05/2019.  Recently, she was at her mother's place in [place], when she had been having worsening "indigestion" symptoms.  She presented to [facility] and was found to have NSTEMI, with coronary angiogram showing severe multivessel disease.  She underwent successful CABG.  Echocardiogram showed preserved LVEF.  Patient was seen by [facility] cardiologist recently.  Since she lives in [place], she wants to reestablish care with me.  She is doing well since CABG.  She has not had her "indigestion" symptoms since then.  She has been walking at [employer] for of couple miles without any symptoms.  She has quit smoking.  She is trying to cut down red meat intake.  Blood pressure is well-controlled.  She has tried multiple statins in the past, caused severe myalgias, as well as nausea.  She is currently on Zetia.  LDL was 155 recently.  Past Medical History:  Diagnosis Date   Anxiety    Arthritis    "all my joints"   Breast cancer, right breast ([place]) 2001   S/P lumpecbomy and radiation   Depression    Fibromyalgia    GERD (gastroesophageal reflux disease)    High cholesterol    Hypertension    "just when I had radiation in 2001"   Migraine    "when I don't take my ASA qd" (08/15/2014)   Numbness and tingling in left hand    Spinal headache    Stroke [facility]) ~ 2013   "silent stroke" per her neurologist   Past Surgical History:  Procedure Laterality Date   BREAST LUMPECTOMY Right 09/10/1999   BREAST LUMPECTOMY Right    2nd time w/"all the lymph nodes under my arm"   BREAST RECONSTRUCTION Bilateral    CESAREAN SECTION   1987; 1988   CHOLECYSTECTOMY  09/10/1999   CORONARY ARTERY BYPASS GRAFT  06/2022   DILATION AND CURETTAGE OF UTERUS  09/10/1999   FOOT SURGERY Left ~ 2012   joint cleaned out   [place] Right 08/15/2014   TOTAL HIP ARTHROPLASTY Right 08/15/2014   Procedure: RIGHT TOTAL HIP ARTHROPLASTY;  Surgeon: Kerin Salen, MD;  Location: [place];  Service: Orthopedics;  Laterality: Right;   Family History  Problem Relation Age of Onset   Hypertension Father    Social History   Socioeconomic History   Marital status: Single    Spouse name: Not on file   Number of children: 2   Years of education: Not on file   Highest education level: Not on file  Occupational History   Not on file  Tobacco Use   Smoking status: Former    Packs/day: 0.50    Years: 36.00    Total pack years: 18.00    Types: Cigarettes    Quit date: 06/28/2022    Years since quitting: 0.1   Smokeless tobacco: Never  Vaping Use   Vaping Use: Never used  Substance and Sexual Activity   Alcohol use: Not Currently    Comment: 08/15/2014 "a few times/year"   Drug use: No   Sexual activity:  Never  Other Topics Concern   Not on file  Social History Narrative   Not on file   Social Determinants of Health   Financial Resource Strain: Not on file  Food Insecurity: Not on file  Transportation Needs: Not on file  Physical Activity: Not on file  Stress: Not on file  Social Connections: Not on file  Intimate Partner Violence: Not on file      Current Outpatient Medications:    ALPRAZolam (XANAX) 1 MG tablet, Take 1 mg by mouth as needed for sleep. , Disp: , Rfl:    buPROPion (WELLBUTRIN SR) 100 MG 12 hr tablet, TAKE ONE TABLET BY MOUTH TWICE A DAY, Disp: 60 tablet, Rfl: 3   carisoprodol (SOMA) 350 MG tablet, Take 350 mg by mouth 4 (four) times daily as needed., Disp: , Rfl:    cilostazol (PLETAL) 50 MG tablet, TAKE ONE TABLET BY MOUTH TWICE A DAY, Disp: 180 tablet, Rfl: 2    clopidogrel (PLAVIX) 75 MG tablet, TAKE ONE TABLET BY MOUTH DAILY, Disp: 90 tablet, Rfl: 2   DULoxetine (CYMBALTA) 60 MG capsule, Take 60 mg by mouth daily., Disp: , Rfl:    gabapentin (NEURONTIN) 600 MG tablet, Take 600 mg by mouth daily., Disp: , Rfl:    hydrochlorothiazide (HYDRODIURIL) 12.5 MG tablet, Take 12.5 mg by mouth daily., Disp: , Rfl:    loratadine (CLARITIN) 10 MG tablet, Take 10 mg by mouth as needed for allergies., Disp: , Rfl:    propranolol (INDERAL) 10 MG tablet, Take 10 mg by mouth daily., Disp: , Rfl:    Cardiovascular & other pertient studies:  Reviewed external labs and tests, independently interpreted  EKG 08/09/2022: Sinus rhythm 83 bpm  Old anteroseptal infarct Nonspecific ST depression   Echocardiogram 06/30/2022:  EF, moderate concentric LVH no wall motion abnormality.   Heart catheterization 06/2022:  90% distal left main, 60% proximal RCA, 25% mid RCA.  Recent labs: 08/05/2022: Glucose 92, BUN/Cr 14/0.94. EGFR 68. Na/K 143/5.1.  H/H 15/49. MCV 94. Platelets 420 Direct LDL 155    Review of Systems  Cardiovascular:  Negative for chest pain, dyspnea on exertion, leg swelling, palpitations and syncope.         Vitals:   08/09/22 1301  BP: 135/76  Pulse: 87  Resp: 16    Body mass index is 33.57 kg/m. Filed Weights   08/09/22 1301  Weight: 208 lb (94.3 kg)     Objective:   Physical Exam Vitals and nursing note reviewed.  Constitutional:      General: She is not in acute distress. Neck:     Vascular: No JVD.  Cardiovascular:     Rate and Rhythm: Normal rate and regular rhythm.     Pulses:          Femoral pulses are 2+ on the right side and 2+ on the left side.      Popliteal pulses are 1+ on the right side and 1+ on the left side.       Dorsalis pedis pulses are 0 on the right side and 0 on the left side.       Posterior tibial pulses are 0 on the right side and 0 on the left side.     Heart sounds: Normal heart sounds. No  murmur heard. Pulmonary:     Effort: Pulmonary effort is normal.     Breath sounds: Normal breath sounds. No wheezing or rales.  Musculoskeletal:     Right lower leg: No  edema.     Left lower leg: No edema.             Visit diagnoses:   ICD-10-CM   1. Coronary artery disease involving native coronary artery of native heart without angina pectoris  I25.10 EKG 12-Lead    clopidogrel (PLAVIX) 75 MG tablet    Evolocumab (REPATHA SURECLICK) 597 MG/ML SOAJ    Amb Referral to Cardiac Rehabilitation    colchicine 0.6 MG tablet    2. Statin myopathy  G72.0 Evolocumab (REPATHA SURECLICK) 416 MG/ML SOAJ   T46.6X5A     3. S/P CABG (coronary artery bypass graft)  Z95.1 colchicine 0.6 MG tablet    4. Mixed hyperlipidemia  E78.2     5. Essential hypertension  I10        Orders Placed This Encounter  Procedures   Amb Referral to Cardiac Rehabilitation   EKG 12-Lead     Medication changes this visit:  Meds ordered this encounter  Medications   clopidogrel (PLAVIX) 75 MG tablet    Sig: Take 1 tablet (75 mg total) by mouth daily.    Dispense:  90 tablet    Refill:  3   Evolocumab (REPATHA SURECLICK) 384 MG/ML SOAJ    Sig: Inject 140 mg into the skin every 14 (fourteen) days.    Dispense:  6 mL    Refill:  5   colchicine 0.6 MG tablet    Sig: Take 1 tablet (0.6 mg total) by mouth daily.    Dispense:  30 tablet    Refill:  3     Assessment & Recommendations:   66 y/o Caucasian female with hypertension, hyperlipidemia, CAD s/p CABG x3 (LIMA-LAD, SVG-ramus and RCA), PAD, major depression.  CAD: No anginal symptoms at this time since CABG (06/2022). Continue aspirin 81 mg daily. Added Plavix 75 mg daily to improve vein graft patency. Continue metoprolol succinate 100 mg daily. Statin intolerance and prior history of statin myopathy. In addition to Zetia 10 mg daily, recommend Repatha for secondary prevention. Will repeat lipid panel at next visit in 3 months. Also  added colchicine 0.6 mg daily for secondary prevention. Refer to cardiac rehab. Continue regular exercise, reduce red meat intake. I congratulated the patient on smoking cessation.  PAD: No claudication symptoms at this time, no CLI. Continue aggressive medical management as above.  Hypertension: Well-controlled.  Mixed hyperlipidemia: As above  F/u in 3 months     Nigel Mormon, MD Pager: [phone] Office: [phone]

## 2022-08-13 ENCOUNTER — Telehealth: Payer: Self-pay | Admitting: Cardiology

## 2022-08-13 ENCOUNTER — Telehealth: Payer: Self-pay

## 2022-08-13 DIAGNOSIS — K921 Melena: Secondary | ICD-10-CM

## 2022-08-13 NOTE — Telephone Encounter (Signed)
Dark stools since Plavix and colchicine started on 08/09/2022.  Advised the patient to discontinue Plavix, continue aspirin, will place order for stool hemoccult to see whether she is having any GI bleed for stool occult blood testing.    ICD-10-CM   1. Complaint of melena  K92.1 [place] Stool Card-TAKE HOME     Orders Placed This Encounter  Procedures   [place] Stool Card-TAKE HOME     Medications Discontinued During This Encounter  Medication Reason   clopidogrel (PLAVIX) 75 MG tablet Side effect (s)     Adrian Prows, MD, [facility] 08/13/2022, 12:13 PM Office: [phone] Fax: [phone] Pager: [phone]

## 2022-08-13 NOTE — Telephone Encounter (Signed)
Patient does not want to have stool guaiac done, states that she will discontinue Plavix and see how she does.  However we did recommend stool guaiac testing

## 2022-08-13 NOTE — Telephone Encounter (Signed)
Patient is calling after two doses of plavix and colphicine. She is having hot and cold sweats, and black tarry diarrhea. After consulting with Dr. Einar Gip, the patient should stop taking the Plavix and switch to aspirin. Will also let her know to see her PCP for the dark stools, per Dr. Einar Gip.

## 2022-08-14 NOTE — Telephone Encounter (Signed)
Agree. Thank yoi.

## 2022-08-15 ENCOUNTER — Ambulatory Visit: Payer: Medicaid Other | Admitting: Physical Therapy

## 2022-08-22 ENCOUNTER — Ambulatory Visit: Payer: Medicaid Other | Admitting: Physical Therapy

## 2022-08-29 ENCOUNTER — Encounter: Payer: Medicaid Other | Admitting: Physical Therapy

## 2022-09-12 ENCOUNTER — Encounter: Payer: Medicaid Other | Admitting: Physical Therapy

## 2022-09-19 ENCOUNTER — Encounter: Payer: Medicaid Other | Admitting: Physical Therapy

## 2022-09-23 ENCOUNTER — Encounter (HOSPITAL_COMMUNITY): Payer: Self-pay

## 2022-09-26 ENCOUNTER — Encounter: Payer: Medicaid Other | Admitting: Physical Therapy

## 2022-09-30 ENCOUNTER — Encounter (HOSPITAL_COMMUNITY): Payer: Self-pay

## 2022-09-30 ENCOUNTER — Telehealth (HOSPITAL_COMMUNITY): Payer: Self-pay

## 2022-09-30 NOTE — Telephone Encounter (Signed)
Pt insurance is active and benefits verified through [employer] $3, DED 0/0 met, out of pocket 0/0 met, co-insurance 0%. no pre-authorization required, [employer] 09/30/22'@1'$ :45pm, REF# [id number] G CODES ARE NOT COVERED   How many CR sessions are covered? No limit Is this a lifetime maximum or an annual maximum? annual Has the member used any of these services to date? no Is there a time limit (weeks/months) on start of program and/or program completion? no

## 2022-09-30 NOTE — Telephone Encounter (Signed)
Patient called wanting to schedule for Cardiac Rehab Program. Patient will come in for orientation on 10/09/22'@10'$ :00am and will attend the 1:45pm exercise class.   Sent information via myChart.

## 2022-10-07 ENCOUNTER — Telehealth (HOSPITAL_COMMUNITY): Payer: Self-pay

## 2022-10-07 NOTE — Telephone Encounter (Signed)
Pt returning [facility] phone call to confirm cardiac rehab orientation appt on 1/31'@10am'$ , pt stated that she is going to be there.

## 2022-10-07 NOTE — Telephone Encounter (Signed)
Called pt to confirm appt on 10/09/22 at 1000. No answer, LM on VM.   Colbert Ewing, MS 10/07/2022 11:10 AM

## 2022-10-09 ENCOUNTER — Encounter (HOSPITAL_COMMUNITY)
Admission: RE | Admit: 2022-10-09 | Discharge: 2022-10-09 | Disposition: A | Discharge status: Home / Self Care | Payer: Medicaid Other | Source: Ambulatory Visit | Attending: Cardiology | Admitting: Cardiology

## 2022-10-09 VITALS — BP 132/74 | HR 73 | Ht 66.25 in | Wt 212.5 lb

## 2022-10-09 DIAGNOSIS — I214 Non-ST elevation (NSTEMI) myocardial infarction: Secondary | ICD-10-CM | POA: Diagnosis present

## 2022-10-09 DIAGNOSIS — Z951 Presence of aortocoronary bypass graft: Secondary | ICD-10-CM | POA: Diagnosis present

## 2022-10-09 NOTE — Progress Notes (Signed)
Cardiac Individual Treatment Plan  Patient Details  Name: Julie Turner MRN: [id number] Date of Birth: 02-Oct-1955 Referring Provider:   Flowsheet Row CARDIAC REHAB PHASE II ORIENTATION from 10/09/2022 in [facility] [place]  Referring Provider Vernell Leep, MD       Initial Encounter Date:  [place] PHASE II ORIENTATION from 10/09/2022 in [facility]  Date 10/09/22       Visit Diagnosis: NSTEMI (non-ST elevated myocardial infarction) ([place])  S/P CABG x 3  Patient's Home Medications on Admission:  Current Outpatient Medications:    ALPRAZolam (XANAX) 1 MG tablet, Take 1 mg by mouth as needed for sleep. , Disp: , Rfl:    aspirin EC 81 MG tablet, Take 81 mg by mouth daily., Disp: , Rfl:    clopidogrel (PLAVIX) 75 MG tablet, Take 75 mg by mouth daily., Disp: , Rfl:    colchicine 0.6 MG tablet, Take 1 tablet (0.6 mg total) by mouth daily., Disp: 30 tablet, Rfl: 3   DULoxetine (CYMBALTA) 60 MG capsule, Take 60 mg by mouth daily., Disp: , Rfl:    Evolocumab (REPATHA SURECLICK) 814 MG/ML SOAJ, Inject 140 mg into the skin every 14 (fourteen) days., Disp: 6 mL, Rfl: 5   fluticasone (FLONASE) 50 MCG/ACT nasal spray, Place 2 sprays into both nostrils daily., Disp: , Rfl:    gabapentin (NEURONTIN) 600 MG tablet, Take 600 mg by mouth daily., Disp: , Rfl:    loratadine (CLARITIN) 10 MG tablet, Take 10 mg by mouth as needed for allergies., Disp: , Rfl:    metoprolol succinate (TOPROL-XL) 100 MG 24 hr tablet, Take 100 mg by mouth daily., Disp: , Rfl:    ezetimibe (ZETIA) 10 MG tablet, Take 10 mg by mouth daily., Disp: , Rfl:    hydrochlorothiazide (HYDRODIURIL) 25 MG tablet, Take 25 mg by mouth daily., Disp: , Rfl:    lisinopril (ZESTRIL) 40 MG tablet, Take 40 mg by mouth daily., Disp: , Rfl:   Past Medical History: Past Medical History:  Diagnosis Date   Anxiety     Arthritis    "all my joints"   Breast cancer, right breast ([place]) 2001   S/P lumpecbomy and radiation   Depression    Fibromyalgia    GERD (gastroesophageal reflux disease)    High cholesterol    Hypertension    "just when I had radiation in 2001"   Migraine    "when I don't take my ASA qd" (08/15/2014)   Numbness and tingling in left hand    Spinal headache    Stroke ([place]) ~ 2013   "silent stroke" per her neurologist    Tobacco Use: Social History   Tobacco Use  Smoking Status Former   Packs/day: 0.50   Years: 36.00   Total pack years: 18.00   Types: Cigarettes   Quit date: 06/28/2022   Years since quitting: 0.2  Smokeless Tobacco Never    Labs: Review Flowsheet       Latest Ref Rng & Units 03/31/2020  Labs for ITP Cardiac and Pulmonary Rehab  Cholestrol 100 - 199 mg/dL 214   LDL (calc) 0 - 99 mg/dL 130   HDL-C >39 mg/dL 37   Trlycerides 0 - 149 mg/dL 265     Capillary Blood Glucose: No results found for: "GLUCAP"   Exercise Target Goals: Exercise Program Goal: Individual exercise prescription set using results from initial 6 min  walk test and THRR while considering  patient's activity barriers and safety.   Exercise Prescription Goal: Initial exercise prescription builds to 30-45 minutes a day of aerobic activity, 2-3 days per week.  Home exercise guidelines will be given to patient during program as part of exercise prescription that the participant will acknowledge.  Activity Barriers & Risk Stratification:  Activity Barriers & Cardiac Risk Stratification - 10/09/22 1401       Activity Barriers & Cardiac Risk Stratification   Activity Barriers Arthritis;Right Hip Replacement;Neck/Spine Problems;Joint Problems;History of Falls    Cardiac Risk Stratification High   <5 METs on 6MWT            6 Minute Walk:  6 Minute Walk     Row Name 10/09/22 1400         6 Minute Walk   Phase Initial     Distance 1320 feet     Walk Time 6 minutes      # of Rest Breaks 1  leg cramp, 4:15-4:26     MPH 2.5     METS 3.09     RPE 9     Perceived Dyspnea  0     VO2 Peak 10.83     Symptoms Yes (comment)     Comments R calf cramp 9/10 pain. took break, resolved with rest     Resting HR 73 bpm     Resting BP 132/74     Resting Oxygen Saturation  98 %     Exercise Oxygen Saturation  during 6 min walk 99 %     Max Ex. HR 104 bpm     Max Ex. BP 154/80     2 Minute Post BP 130/68              Oxygen Initial Assessment:   Oxygen Re-Evaluation:   Oxygen Discharge (Final Oxygen Re-Evaluation):   Initial Exercise Prescription:  Initial Exercise Prescription - 10/09/22 1400       Date of Initial Exercise RX and Referring Provider   Date 10/09/22    Referring Provider Vernell Leep, MD    Expected Discharge Date 12/06/22      Treadmill   MPH 2    Grade 0    Minutes 15    METs 3      Rower   Level 1    Minutes 15    METs 2      Prescription Details   Frequency (times per week) 3    Duration Progress to 30 minutes of continuous aerobic without signs/symptoms of physical distress      Intensity   THRR 40-80% of Max Heartrate 62-125    Ratings of Perceived Exertion 11-13    Perceived Dyspnea 0-4      Progression   Progression Continue to progress workloads to maintain intensity without signs/symptoms of physical distress.      Resistance Training   Training Prescription Yes    Weight 3    Reps 10-15             Perform Capillary Blood Glucose checks as needed.  Exercise Prescription Changes:   Exercise Comments:   Exercise Goals and Review:   Exercise Goals     Row Name 10/09/22 1404             Exercise Goals   Increase Physical Activity Yes       Intervention Provide advice, education, support and counseling about physical activity/exercise needs.;Develop an individualized exercise prescription  for aerobic and resistive training based on initial evaluation findings, risk stratification,  comorbidities and participant's personal goals.       Expected Outcomes Short Term: Attend rehab on a regular basis to increase amount of physical activity.;Long Term: Exercising regularly at least 3-5 days a week.;Long Term: Add in home exercise to make exercise part of routine and to increase amount of physical activity.       Increase Strength and Stamina Yes       Intervention Provide advice, education, support and counseling about physical activity/exercise needs.;Develop an individualized exercise prescription for aerobic and resistive training based on initial evaluation findings, risk stratification, comorbidities and participant's personal goals.       Expected Outcomes Short Term: Increase workloads from initial exercise prescription for resistance, speed, and METs.;Long Term: Improve cardiorespiratory fitness, muscular endurance and strength as measured by increased METs and functional capacity (6MWT);Short Term: Perform resistance training exercises routinely during rehab and add in resistance training at home       Able to understand and use rate of perceived exertion (RPE) scale Yes       Intervention Provide education and explanation on how to use RPE scale       Expected Outcomes Short Term: Able to use RPE daily in rehab to express subjective intensity level;Long Term:  Able to use RPE to guide intensity level when exercising independently       Knowledge and understanding of Target Heart Rate Range (THRR) Yes       Intervention Provide education and explanation of THRR including how the numbers were predicted and where they are located for reference       Expected Outcomes Short Term: Able to state/look up THRR;Long Term: Able to use THRR to govern intensity when exercising independently;Short Term: Able to use daily as guideline for intensity in rehab       Understanding of Exercise Prescription Yes       Intervention Provide education, explanation, and written materials on patient's  individual exercise prescription       Expected Outcomes Short Term: Able to explain program exercise prescription;Long Term: Able to explain home exercise prescription to exercise independently                Exercise Goals Re-Evaluation :   Discharge Exercise Prescription (Final Exercise Prescription Changes):   Nutrition:  Target Goals: Understanding of nutrition guidelines, daily intake of sodium '1500mg'$ , cholesterol '200mg'$ , calories 30% from fat and 7% or less from saturated fats, daily to have 5 or more servings of fruits and vegetables.  Biometrics:  Pre Biometrics - 10/09/22 1057       Pre Biometrics   Waist Circumference 43.5 inches    Hip Circumference 46 inches    Waist to Hip Ratio 0.95 %    Triceps Skinfold 44 mm    % Body Fat 47.1 %    Grip Strength 30 kg    Flexibility 13.5 in    Single Leg Stand 18.25 seconds              Nutrition Therapy Plan and Nutrition Goals:   Nutrition Assessments:  MEDIFICTS Score Key: ?70 Need to make dietary changes  40-70 Heart Healthy Diet ? 40 Therapeutic Level Cholesterol Diet    Picture Your Plate Scores: <25 Unhealthy dietary pattern with much room for improvement. 41-50 Dietary pattern unlikely to meet recommendations for good health and room for improvement. 51-60 More healthful dietary pattern, with some room for improvement.  >  60 Healthy dietary pattern, although there may be some specific behaviors that could be improved.    Nutrition Goals Re-Evaluation:   Nutrition Goals Re-Evaluation:   Nutrition Goals Discharge (Final Nutrition Goals Re-Evaluation):   Psychosocial: Target Goals: Acknowledge presence or absence of significant depression and/or stress, maximize coping skills, provide positive support system. Participant is able to verbalize types and ability to use techniques and skills needed for reducing stress and depression.  Initial Review & Psychosocial Screening:  Initial Psych Review  & Screening - 10/09/22 1405       Initial Review   Current issues with History of Depression;Current Depression;Current Psychotropic Meds      Family Dynamics   Good Support System? Yes   Gwendolynn has her son who she lives with for support   Comments Laurie stated that after her surgery she had trouble coming to terms with her diagnoses and accepting that she had a heart attack. She states it has gotten better in the months following and she has been slowly coming to terms with her diagnosis. She also has some stress regarding finances. Support was offered regarding her depression/anxiety but Corah denies at this time. She is taking her Xanax and Cymbalta and feels her medications are working for her.      Barriers   Psychosocial barriers to participate in program The patient should benefit from training in stress management and relaxation.      Screening Interventions   Interventions Encouraged to exercise;To provide support and resources with identified psychosocial needs;Provide feedback about the scores to participant    Expected Outcomes Short Term goal: Utilizing psychosocial counselor, staff and physician to assist with identification of specific Stressors or current issues interfering with healing process. Setting desired goal for each stressor or current issue identified.;Long Term Goal: Stressors or current issues are controlled or eliminated.;Short Term goal: Identification and review with participant of any Quality of Life or Depression concerns found by scoring the questionnaire.;Long Term goal: The participant improves quality of Life and PHQ9 Scores as seen by post scores and/or verbalization of changes             Quality of Life Scores:  Quality of Life - 10/09/22 1413       Quality of Life   Select Quality of Life      Quality of Life Scores   Health/Function Pre 15.57 %    Socioeconomic Pre 20.19 %    Psych/Spiritual Pre 13.79 %    Family Pre 24 %    GLOBAL Pre 17.28  %            Scores of 19 and below usually indicate a poorer quality of life in these areas.  A difference of  2-3 points is a clinically meaningful difference.  A difference of 2-3 points in the total score of the Quality of Life Index has been associated with significant improvement in overall quality of life, self-image, physical symptoms, and general health in studies assessing change in quality of life.  PHQ-9: Review Flowsheet       10/09/2022  Depression screen PHQ 2/9  Decreased Interest 2  Down, Depressed, Hopeless 1  PHQ - 2 Score 3  Altered sleeping 2  Tired, decreased energy 2  Change in appetite 2  Feeling bad or failure about yourself  2  Trouble concentrating 1  Moving slowly or fidgety/restless 1  Suicidal thoughts 0  PHQ-9 Score 13  Difficult doing work/chores Somewhat difficult   Interpretation of Total  Score  Total Score Depression Severity:  1-4 = Minimal depression, 5-9 = Mild depression, 10-14 = Moderate depression, 15-19 = Moderately severe depression, 20-27 = Severe depression   Psychosocial Evaluation and Intervention:   Psychosocial Re-Evaluation:   Psychosocial Discharge (Final Psychosocial Re-Evaluation):   Vocational Rehabilitation: Provide vocational rehab assistance to qualifying candidates.   Vocational Rehab Evaluation & Intervention:  Vocational Rehab - 10/09/22 1415       Initial Vocational Rehab Evaluation & Intervention   Assessment shows need for Vocational Rehabilitation No   Deonne is on disability currently. She is not sure when she will return to work and does not need vocational rehab            Education: Education Goals: Education classes will be provided on a weekly basis, covering required topics. Participant will state understanding/return demonstration of topics presented.     Core Videos: Exercise    Move It!  Clinical staff conducted group or individual video education with verbal and written material  and guidebook.  Patient learns the recommended Pritikin exercise program. Exercise with the goal of living a long, healthy life. Some of the health benefits of exercise include controlled diabetes, healthier blood pressure levels, improved cholesterol levels, improved heart and lung capacity, improved sleep, and better body composition. Everyone should speak with their doctor before starting or changing an exercise routine.  Biomechanical Limitations Clinical staff conducted group or individual video education with verbal and written material and guidebook.  Patient learns how biomechanical limitations can impact exercise and how we can mitigate and possibly overcome limitations to have an impactful and balanced exercise routine.  Body Composition Clinical staff conducted group or individual video education with verbal and written material and guidebook.  Patient learns that body composition (ratio of muscle mass to fat mass) is a key component to assessing overall fitness, rather than body weight alone. Increased fat mass, especially visceral belly fat, can put [place] at increased risk for metabolic syndrome, type 2 diabetes, heart disease, and even death. It is recommended to combine diet and exercise (cardiovascular and resistance training) to improve your body composition. Seek guidance from your physician and exercise physiologist before implementing an exercise routine.  Exercise Action Plan Clinical staff conducted group or individual video education with verbal and written material and guidebook.  Patient learns the recommended strategies to achieve and enjoy long-term exercise adherence, including variety, self-motivation, self-efficacy, and positive decision making. Benefits of exercise include fitness, good health, weight management, more energy, better sleep, less stress, and overall well-being.  Medical   Heart Disease Risk Reduction Clinical staff conducted group or individual video  education with verbal and written material and guidebook.  Patient learns our heart is our most vital organ as it circulates oxygen, nutrients, white blood cells, and hormones throughout the entire body, and carries waste away. Data supports a plant-based eating plan like the Pritikin Program for its effectiveness in slowing progression of and reversing heart disease. The video provides a number of recommendations to address heart disease.   Metabolic Syndrome and Belly Fat  Clinical staff conducted group or individual video education with verbal and written material and guidebook.  Patient learns what metabolic syndrome is, how it leads to heart disease, and how one can reverse it and keep it from coming back. You have metabolic syndrome if you have 3 of the following 5 criteria: abdominal obesity, high blood pressure, high triglycerides, low HDL cholesterol, and high blood sugar.  Hypertension and Heart Disease Clinical  staff conducted group or individual video education with verbal and written material and guidebook.  Patient learns that high blood pressure, or hypertension, is very common in the [place]. Hypertension is largely due to excessive salt intake, but other important risk factors include being overweight, physical inactivity, drinking too much alcohol, smoking, and not eating enough potassium from fruits and vegetables. High blood pressure is a leading risk factor for heart attack, stroke, congestive heart failure, dementia, kidney failure, and premature death. Long-term effects of excessive salt intake include stiffening of the arteries and thickening of heart muscle and organ damage. Recommendations include ways to reduce hypertension and the risk of heart disease.  Diseases of Our Time - Focusing on Diabetes Clinical staff conducted group or individual video education with verbal and written material and guidebook.  Patient learns why the best way to stop diseases of our time is  prevention, through food and other lifestyle changes. Medicine (such as prescription pills and surgeries) is often only a Band-Aid on the problem, not a long-term solution. Most common diseases of our time include obesity, type 2 diabetes, hypertension, heart disease, and cancer. The Pritikin Program is recommended and has been proven to help reduce, reverse, and/or prevent the damaging effects of metabolic syndrome.  Nutrition   Overview of the Pritikin Eating Plan  Clinical staff conducted group or individual video education with verbal and written material and guidebook.  Patient learns about the [place] for disease risk reduction. The [place] emphasizes a wide variety of unrefined, minimally-processed carbohydrates, like fruits, vegetables, whole grains, and legumes. Go, Caution, and Stop food choices are explained. Plant-based and lean animal proteins are emphasized. Rationale provided for low sodium intake for blood pressure control, low added sugars for blood sugar stabilization, and low added fats and oils for coronary artery disease risk reduction and weight management.  Calorie Density  Clinical staff conducted group or individual video education with verbal and written material and guidebook.  Patient learns about calorie density and how it impacts the Pritikin Eating Plan. Knowing the characteristics of the food you choose will help you decide whether those foods will lead to weight gain or weight loss, and whether you want to consume more or less of them. Weight loss is usually a side effect of the Pritikin Eating Plan because of its focus on low calorie-dense foods.  Label Reading  Clinical staff conducted group or individual video education with verbal and written material and guidebook.  Patient learns about the Pritikin recommended label reading guidelines and corresponding recommendations regarding calorie density, added sugars, sodium content, and whole  grains.  Dining Out - Part 1  Clinical staff conducted group or individual video education with verbal and written material and guidebook.  Patient learns that restaurant meals can be sabotaging because they can be so high in calories, fat, sodium, and/or sugar. Patient learns recommended strategies on how to positively address this and avoid unhealthy pitfalls.  Facts on Fats  Clinical staff conducted group or individual video education with verbal and written material and guidebook.  Patient learns that lifestyle modifications can be just as effective, if not more so, as many medications for lowering your risk of heart disease. A Pritikin lifestyle can help to reduce your risk of inflammation and atherosclerosis (cholesterol build-up, or plaque, in the artery walls). Lifestyle interventions such as dietary choices and physical activity address the cause of atherosclerosis. A review of the types of fats and their impact on blood cholesterol  levels, along with dietary recommendations to reduce fat intake is also included.  Nutrition Action Plan  Clinical staff conducted group or individual video education with verbal and written material and guidebook.  Patient learns how to incorporate Pritikin recommendations into their lifestyle. Recommendations include planning and keeping personal health goals in mind as an important part of their success.  Healthy Mind-Set    Healthy Minds, Bodies, Hearts  Clinical staff conducted group or individual video education with verbal and written material and guidebook.  Patient learns how to identify when they are stressed. Video will discuss the impact of that stress, as well as the many benefits of stress management. Patient will also be introduced to stress management techniques. The way we think, act, and feel has an impact on our hearts.  How Our Thoughts Can Heal Our Hearts  Clinical staff conducted group or individual video education with verbal and  written material and guidebook.  Patient learns that negative thoughts can cause depression and anxiety. This can result in negative lifestyle behavior and serious health problems. Cognitive behavioral therapy is an effective method to help control our thoughts in order to change and improve our emotional outlook.  Additional Videos:  Exercise    Improving Performance  Clinical staff conducted group or individual video education with verbal and written material and guidebook.  Patient learns to use a non-linear approach by alternating intensity levels and lengths of time spent exercising to help burn more calories and lose more body fat. Cardiovascular exercise helps improve heart health, metabolism, hormonal balance, blood sugar control, and recovery from fatigue. Resistance training improves strength, endurance, balance, coordination, reaction time, metabolism, and muscle mass. Flexibility exercise improves circulation, posture, and balance. Seek guidance from your physician and exercise physiologist before implementing an exercise routine and learn your capabilities and proper form for all exercise.  Introduction to Yoga  Clinical staff conducted group or individual video education with verbal and written material and guidebook.  Patient learns about yoga, a discipline of the coming together of mind, breath, and body. The benefits of yoga include improved flexibility, improved range of motion, better posture and core strength, increased lung function, weight loss, and positive self-image. Yoga's heart health benefits include lowered blood pressure, healthier heart rate, decreased cholesterol and triglyceride levels, improved immune function, and reduced stress. Seek guidance from your physician and exercise physiologist before implementing an exercise routine and learn your capabilities and proper form for all exercise.  Medical   Aging: Enhancing Your Quality of Life  Clinical staff conducted  group or individual video education with verbal and written material and guidebook.  Patient learns key strategies and recommendations to stay in good physical health and enhance quality of life, such as prevention strategies, having an advocate, securing a Marathon, and keeping a list of medications and system for tracking them. It also discusses how to avoid risk for bone loss.  Biology of Weight Control  Clinical staff conducted group or individual video education with verbal and written material and guidebook.  Patient learns that weight gain occurs because we consume more calories than we burn (eating more, moving less). Even if your body weight is normal, you may have higher ratios of fat compared to muscle mass. Too much body fat puts you at increased risk for cardiovascular disease, heart attack, stroke, type 2 diabetes, and obesity-related cancers. In addition to exercise, following the [place] can help reduce your risk.  Decoding Lab Results  Clinical staff conducted group or individual video education with verbal and written material and guidebook.  Patient learns that lab test reflects one measurement whose values change over time and are influenced by many factors, including medication, stress, sleep, exercise, food, hydration, pre-existing medical conditions, and more. It is recommended to use the knowledge from this video to become more involved with your lab results and evaluate your numbers to speak with your doctor.   Diseases of Our Time - Overview  Clinical staff conducted group or individual video education with verbal and written material and guidebook.  Patient learns that according to the CDC, 50% to 70% of chronic diseases (such as obesity, type 2 diabetes, elevated lipids, hypertension, and heart disease) are avoidable through lifestyle improvements including healthier food choices, listening to satiety cues, and increased physical  activity.  Sleep Disorders Clinical staff conducted group or individual video education with verbal and written material and guidebook.  Patient learns how good quality and duration of sleep are important to overall health and well-being. Patient also learns about sleep disorders and how they impact health along with recommendations to address them, including discussing with a physician.  Nutrition  Dining Out - Part 2 Clinical staff conducted group or individual video education with verbal and written material and guidebook.  Patient learns how to plan ahead and communicate in order to maximize their dining experience in a healthy and nutritious manner. Included are recommended food choices based on the type of restaurant the patient is visiting.   Fueling a Best boy conducted group or individual video education with verbal and written material and guidebook.  There is a strong connection between our food choices and our health. Diseases like obesity and type 2 diabetes are very prevalent and are in large-part due to lifestyle choices. The Pritikin Eating Plan provides plenty of food and hunger-curbing satisfaction. It is easy to follow, affordable, and helps reduce health risks.  Menu Workshop  Clinical staff conducted group or individual video education with verbal and written material and guidebook.  Patient learns that restaurant meals can sabotage health goals because they are often packed with calories, fat, sodium, and sugar. Recommendations include strategies to plan ahead and to communicate with the manager, chef, or server to help order a healthier meal.  Planning Your Eating Strategy  Clinical staff conducted group or individual video education with verbal and written material and guidebook.  Patient learns about the [place] and its benefit of reducing the risk of disease. The [place] does not focus on calories. Instead, it emphasizes  high-quality, nutrient-rich foods. By knowing the characteristics of the foods, we choose, we can determine their calorie density and make informed decisions.  Targeting Your Nutrition Priorities  Clinical staff conducted group or individual video education with verbal and written material and guidebook.  Patient learns that lifestyle habits have a tremendous impact on disease risk and progression. This video provides eating and physical activity recommendations based on your personal health goals, such as reducing LDL cholesterol, losing weight, preventing or controlling type 2 diabetes, and reducing high blood pressure.  Vitamins and Minerals  Clinical staff conducted group or individual video education with verbal and written material and guidebook.  Patient learns different ways to obtain key vitamins and minerals, including through a recommended healthy diet. It is important to discuss all supplements you take with your doctor.   Healthy Mind-Set    Smoking Cessation  Clinical staff conducted group or  individual video education with verbal and written material and guidebook.  Patient learns that cigarette smoking and tobacco addiction pose a serious health risk which affects millions of people. Stopping smoking will significantly reduce the risk of heart disease, lung disease, and many forms of cancer. Recommended strategies for quitting are covered, including working with your doctor to develop a successful plan.  Culinary   Becoming a Financial trader conducted group or individual video education with verbal and written material and guidebook.  Patient learns that cooking at home can be healthy, cost-effective, quick, and puts them in control. Keys to cooking healthy recipes will include looking at your recipe, assessing your equipment needs, planning ahead, making it simple, choosing cost-effective seasonal ingredients, and limiting the use of added fats, salts, and  sugars.  Cooking - Breakfast and Snacks  Clinical staff conducted group or individual video education with verbal and written material and guidebook.  Patient learns how important breakfast is to satiety and nutrition through the entire day. Recommendations include key foods to eat during breakfast to help stabilize blood sugar levels and to prevent overeating at meals later in the day. Planning ahead is also a key component.  Cooking - Human resources officer conducted group or individual video education with verbal and written material and guidebook.  Patient learns eating strategies to improve overall health, including an approach to cook more at home. Recommendations include thinking of animal protein as a side on your plate rather than center stage and focusing instead on lower calorie dense options like vegetables, fruits, whole grains, and plant-based proteins, such as beans. Making sauces in large quantities to freeze for later and leaving the skin on your vegetables are also recommended to maximize your experience.  Cooking - Healthy Salads and Dressing Clinical staff conducted group or individual video education with verbal and written material and guidebook.  Patient learns that vegetables, fruits, whole grains, and legumes are the foundations of the [place]. Recommendations include how to incorporate each of these in flavorful and healthy salads, and how to create homemade salad dressings. Proper handling of ingredients is also covered. Cooking - Soups and [employer] - Soups and Desserts Clinical staff conducted group or individual video education with verbal and written material and guidebook.  Patient learns that Pritikin soups and desserts make for easy, nutritious, and delicious snacks and meal components that are low in sodium, fat, sugar, and calorie density, while high in vitamins, minerals, and filling fiber. Recommendations include simple and healthy  ideas for soups and desserts.   Overview     The Pritikin Solution Program Overview Clinical staff conducted group or individual video education with verbal and written material and guidebook.  Patient learns that the results of the [place] Program have been documented in more than 100 articles published in peer-reviewed journals, and the benefits include reducing risk factors for (and, in some cases, even reversing) high cholesterol, high blood pressure, type 2 diabetes, obesity, and more! An overview of the three key pillars of the Pritikin Program will be covered: eating well, doing regular exercise, and having a healthy mind-set.  WORKSHOPS  Exercise: Exercise Basics: Building Your Action Plan Clinical staff led group instruction and group discussion with PowerPoint presentation and patient guidebook. To enhance the learning environment the use of posters, models and videos may be added. At the conclusion of this workshop, patients will comprehend the difference between physical activity and exercise, as well as the  benefits of incorporating both, into their routine. Patients will understand the FITT (Frequency, Intensity, Time, and Type) principle and how to use it to build an exercise action plan. In addition, safety concerns and other considerations for exercise and cardiac rehab will be addressed by the presenter. The purpose of this lesson is to promote a comprehensive and effective weekly exercise routine in order to improve patients' overall level of fitness.   Managing Heart Disease: Your Path to a Healthier Heart Clinical staff led group instruction and group discussion with PowerPoint presentation and patient guidebook. To enhance the learning environment the use of posters, models and videos may be added.At the conclusion of this workshop, patients will understand the anatomy and physiology of the heart. Additionally, they will understand how Pritikin's three pillars impact the  risk factors, the progression, and the management of heart disease.  The purpose of this lesson is to provide a high-level overview of the heart, heart disease, and how the Pritikin lifestyle positively impacts risk factors.  Exercise Biomechanics Clinical staff led group instruction and group discussion with PowerPoint presentation and patient guidebook. To enhance the learning environment the use of posters, models and videos may be added. Patients will learn how the structural parts of their bodies function and how these functions impact their daily activities, movement, and exercise. Patients will learn how to promote a neutral spine, learn how to manage pain, and identify ways to improve their physical movement in order to promote healthy living. The purpose of this lesson is to expose patients to common physical limitations that impact physical activity. Participants will learn practical ways to adapt and manage aches and pains, and to minimize their effect on regular exercise. Patients will learn how to maintain good posture while sitting, walking, and lifting.  Balance Training and Fall Prevention  Clinical staff led group instruction and group discussion with PowerPoint presentation and patient guidebook. To enhance the learning environment the use of posters, models and videos may be added. At the conclusion of this workshop, patients will understand the importance of their sensorimotor skills (vision, proprioception, and the vestibular system) in maintaining their ability to balance as they age. Patients will apply a variety of balancing exercises that are appropriate for their current level of function. Patients will understand the common causes for poor balance, possible solutions to these problems, and ways to modify their physical environment in order to minimize their fall risk. The purpose of this lesson is to teach patients about the importance of maintaining balance as they age  and ways to minimize their risk of falling.  WORKSHOPS   Nutrition:  Fueling a Scientist, research (physical sciences) led group instruction and group discussion with PowerPoint presentation and patient guidebook. To enhance the learning environment the use of posters, models and videos may be added. Patients will review the foundational principles of the [place] and understand what constitutes a serving size in each of the food groups. Patients will also learn Pritikin-friendly foods that are better choices when away from home and review make-ahead meal and snack options. Calorie density will be reviewed and applied to three nutrition priorities: weight maintenance, weight loss, and weight gain. The purpose of this lesson is to reinforce (in a group setting) the key concepts around what patients are recommended to eat and how to apply these guidelines when away from home by planning and selecting Pritikin-friendly options. Patients will understand how calorie density may be adjusted for different weight management goals.  Mindful Eating  Clinical staff led group instruction and group discussion with PowerPoint presentation and patient guidebook. To enhance the learning environment the use of posters, models and videos may be added. Patients will briefly review the concepts of the [place] and the importance of low-calorie dense foods. The concept of mindful eating will be introduced as well as the importance of paying attention to internal hunger signals. Triggers for non-hunger eating and techniques for dealing with triggers will be explored. The purpose of this lesson is to provide patients with the opportunity to review the basic principles of the [place], discuss the value of eating mindfully and how to measure internal cues of hunger and fullness using the Hunger Scale. Patients will also discuss reasons for non-hunger eating and learn strategies to use for controlling  emotional eating.  Targeting Your Nutrition Priorities Clinical staff led group instruction and group discussion with PowerPoint presentation and patient guidebook. To enhance the learning environment the use of posters, models and videos may be added. Patients will learn how to determine their genetic susceptibility to disease by reviewing their family history. Patients will gain insight into the importance of diet as part of an overall healthy lifestyle in mitigating the impact of genetics and other environmental insults. The purpose of this lesson is to provide patients with the opportunity to assess their personal nutrition priorities by looking at their family history, their own health history and current risk factors. Patients will also be able to discuss ways of prioritizing and modifying the [place] for their highest risk areas  Menu  Clinical staff led group instruction and group discussion with PowerPoint presentation and patient guidebook. To enhance the learning environment the use of posters, models and videos may be added. Using menus brought in from [employer], or printed from [employer], patients will apply the [place] dining out guidelines that were presented in the [employer] video. Patients will also be able to practice these guidelines in a variety of provided scenarios. The purpose of this lesson is to provide patients with the opportunity to practice hands-on learning of the [place] with actual menus and practice scenarios.  Label Reading Clinical staff led group instruction and group discussion with PowerPoint presentation and patient guidebook. To enhance the learning environment the use of posters, models and videos may be added. Patients will review and discuss the Pritikin label reading guidelines presented in Pritikin's Label Reading Educational series video. Using fool labels brought in from local grocery stores  and markets, patients will apply the label reading guidelines and determine if the packaged food meet the Pritikin guidelines. The purpose of this lesson is to provide patients with the opportunity to review, discuss, and practice hands-on learning of the Pritikin Label Reading guidelines with actual packaged food labels. [place] Workshops are designed to teach patients ways to prepare quick, simple, and affordable recipes at home. The importance of nutrition's role in chronic disease risk reduction is reflected in its emphasis in the overall Pritikin program. By learning how to prepare essential core Pritikin Eating Plan recipes, patients will increase control over what they eat; be able to customize the flavor of foods without the use of added salt, sugar, or fat; and improve the quality of the food they consume. By learning a set of core recipes which are easily assembled, quickly prepared, and affordable, patients are more likely to prepare more healthy foods at home. These workshops focus on  convenient breakfasts, simple entres, side dishes, and desserts which can be prepared with minimal effort and are consistent with nutrition recommendations for cardiovascular risk reduction. Cooking [employer] are taught by a Engineer, materials (RD) who has been trained by the [employer]. The chef or RD has a clear understanding of the importance of minimizing - if not completely eliminating - added fat, sugar, and sodium in recipes. Throughout the series of [place] Workshop sessions, patients will learn about healthy ingredients and efficient methods of cooking to build confidence in their capability to prepare    Cooking School weekly topics:  Adding Flavor- Sodium-Free  Fast and Healthy Breakfasts  Powerhouse Plant-Based Proteins  Satisfying Salads and Dressings  Simple Sides and Sauces  International Cuisine-Spotlight on the [place]  Zones  Delicious Desserts  Savory Soups  Efficiency Cooking - Meals in a Snap  Tasty Appetizers and Snacks  Comforting Weekend Breakfasts  One-Pot Wonders   Fast Evening Meals  Easy [place] (Psychosocial): New Thoughts, New Behaviors Clinical staff led group instruction and group discussion with PowerPoint presentation and patient guidebook. To enhance the learning environment the use of posters, models and videos may be added. Patients will learn and practice techniques for developing effective health and lifestyle goals. Patients will be able to effectively apply the goal setting process learned to develop at least one new personal goal.  The purpose of this lesson is to expose patients to a new skill set of behavior modification techniques such as techniques setting SMART goals, overcoming barriers, and achieving new thoughts and new behaviors.  Managing Moods and Relationships Clinical staff led group instruction and group discussion with PowerPoint presentation and patient guidebook. To enhance the learning environment the use of posters, models and videos may be added. Patients will learn how emotional and chronic stress factors can impact their health and relationships. They will learn healthy ways to manage their moods and utilize positive coping mechanisms. In addition, ICR patients will learn ways to improve communication skills. The purpose of this lesson is to expose patients to ways of understanding how one's mood and health are intimately connected. Developing a healthy outlook can help build positive relationships and connections with others. Patients will understand the importance of utilizing effective communication skills that include actively listening and being heard. They will learn and understand the importance of the "4 Cs" and especially Connections in fostering of a Healthy Mind-Set.  Healthy Sleep for  a Healthy Heart Clinical staff led group instruction and group discussion with PowerPoint presentation and patient guidebook. To enhance the learning environment the use of posters, models and videos may be added. At the conclusion of this workshop, patients will be able to demonstrate knowledge of the importance of sleep to overall health, well-being, and quality of life. They will understand the symptoms of, and treatments for, common sleep disorders. Patients will also be able to identify daytime and nighttime behaviors which impact sleep, and they will be able to apply these tools to help manage sleep-related challenges. The purpose of this lesson is to provide patients with a general overview of sleep and outline the importance of quality sleep. Patients will learn about a few of the most common sleep disorders. Patients will also be introduced to the concept of "sleep hygiene," and discover ways to self-manage certain sleeping problems through simple daily behavior changes. Finally, the workshop will motivate patients by clarifying the links  between quality sleep and their goals of heart-healthy living.   Recognizing and Reducing Stress Clinical staff led group instruction and group discussion with PowerPoint presentation and patient guidebook. To enhance the learning environment the use of posters, models and videos may be added. At the conclusion of this workshop, patients will be able to understand the types of stress reactions, differentiate between acute and chronic stress, and recognize the impact that chronic stress has on their health. They will also be able to apply different coping mechanisms, such as reframing negative self-talk. Patients will have the opportunity to practice a variety of stress management techniques, such as deep abdominal breathing, progressive muscle relaxation, and/or guided imagery.  The purpose of this lesson is to educate patients on the role of stress in their lives and  to provide healthy techniques for coping with it.  Learning Barriers/Preferences:  Learning Barriers/Preferences - 10/09/22 1414       Learning Barriers/Preferences   Learning Barriers Sight   pt wears reading glasses   Learning Preferences Audio;Computer/Internet;Group Instruction;Individual Instruction;Pictoral;Skilled Demonstration;Verbal Instruction;Video;Written Material             Education Topics:  Knowledge Questionnaire Score:  Knowledge Questionnaire Score - 10/09/22 1414       Knowledge Questionnaire Score   Pre Score 20/24             Core Components/Risk Factors/Patient Goals at Admission:  Personal Goals and Risk Factors at Admission - 10/09/22 1416       Core Components/Risk Factors/Patient Goals on Admission    Weight Management Yes;Obesity;Weight Loss    Intervention Obesity: Provide education and appropriate resources to help participant work on and attain dietary goals.;Weight Management/Obesity: Establish reasonable short term and long term weight goals.;Weight Management: Provide education and appropriate resources to help participant work on and attain dietary goals.;Weight Management: Develop a combined nutrition and exercise program designed to reach desired caloric intake, while maintaining appropriate intake of nutrient and fiber, sodium and fats, and appropriate energy expenditure required for the weight goal.    Goal Weight: Short Term 180 lb (81.6 kg)    Expected Outcomes Short Term: Continue to assess and modify interventions until short term weight is achieved;Long Term: Adherence to nutrition and physical activity/exercise program aimed toward attainment of established weight goal;Weight Loss: Understanding of general recommendations for a balanced deficit meal plan, which promotes 1-2 lb weight loss per week and includes a negative energy balance of [phone] kcal/d;Understanding recommendations for meals to include 15-35% energy as protein,  25-35% energy from fat, 35-60% energy from carbohydrates, less than '200mg'$  of dietary cholesterol, 20-35 gm of total fiber daily;Understanding of distribution of calorie intake throughout the day with the consumption of 4-5 meals/snacks    Hypertension Yes    Intervention Provide education on lifestyle modifcations including regular physical activity/exercise, weight management, moderate sodium restriction and increased consumption of fresh fruit, vegetables, and low fat dairy, alcohol moderation, and smoking cessation.;Monitor prescription use compliance.    Expected Outcomes Short Term: Continued assessment and intervention until BP is < 140/53m HG in hypertensive participants. < 130/856mHG in hypertensive participants with diabetes, heart failure or chronic kidney disease.;Long Term: Maintenance of blood pressure at goal levels.    Lipids Yes    Intervention Provide education and support for participant on nutrition & aerobic/resistive exercise along with prescribed medications to achieve LDL '70mg'$ , HDL >'40mg'$ .    Expected Outcomes Short Term: Participant states understanding of desired cholesterol values and is compliant with medications prescribed. Participant  is following exercise prescription and nutrition guidelines.;Long Term: Cholesterol controlled with medications as prescribed, with individualized exercise RX and with personalized nutrition plan. Value goals: LDL < '70mg'$ , HDL > 40 mg.    Stress Yes    Intervention Offer individual and/or small group education and counseling on adjustment to heart disease, stress management and health-related lifestyle change. Teach and support self-help strategies.;Refer participants experiencing significant psychosocial distress to appropriate mental health specialists for further evaluation and treatment. When possible, include family members and significant others in education/counseling sessions.    Expected Outcomes Long Term: Emotional wellbeing is  indicated by absence of clinically significant psychosocial distress or social isolation.;Short Term: Participant demonstrates changes in health-related behavior, relaxation and other stress management skills, ability to obtain effective social support, and compliance with psychotropic medications if prescribed.             Core Components/Risk Factors/Patient Goals Review:    Core Components/Risk Factors/Patient Goals at Discharge (Final Review):    ITP Comments:  ITP Comments     Row Name 10/09/22 1056           ITP Comments Dr. Fransico Him medical director. Introduction to [facility] education program/ intensive cardiac rehab. Initial orientation packet reviewed with patient.                Comments: Participant attended orientation for the cardiac rehabilitation program on  10/09/2022  to perform initial intake and exercise walk test. Patient introduced to the [facility] education and orientation packet was reviewed. Completed 6-minute walk test, measurements, initial ITP, and exercise prescription. Vital signs stable. Telemetry-normal sinus rhythm, asymptomatic.   Service time was from 1015 to [place], [place] 10/09/2022 2:24 PM  .

## 2022-10-09 NOTE — Progress Notes (Signed)
Cardiac Rehab Medication Review   Does the patient  feel that his/her medications are working for him/her?  YES   Has the patient been experiencing any side effects to the medications prescribed?  YES   Does the patient measure his/her own blood pressure or blood glucose at home?  YES    Does the patient have any problems obtaining medications due to transportation or finances?   NO  Understanding of regimen: good Understanding of indications: good Potential of compliance: good    Comments: Julie Turner has a good understanding of her medications and her regime. She checks her BP at home every other day. She has noted some cold like symptoms, sore throat/ stuffy nose, which she was told was a common side effect to her medications and she stopped taking it on 10/04/22. Since then she has noticed improvement.     Colbert Ewing, MS 10/09/2022 2:17 PM

## 2022-10-14 ENCOUNTER — Encounter (HOSPITAL_COMMUNITY)
Admission: RE | Admit: 2022-10-14 | Discharge: 2022-10-14 | Disposition: A | Discharge status: Home / Self Care | Payer: Medicaid Other | Source: Ambulatory Visit | Attending: Cardiology | Admitting: Cardiology

## 2022-10-14 DIAGNOSIS — Z951 Presence of aortocoronary bypass graft: Secondary | ICD-10-CM

## 2022-10-14 DIAGNOSIS — I214 Non-ST elevation (NSTEMI) myocardial infarction: Secondary | ICD-10-CM | POA: Diagnosis not present

## 2022-10-14 NOTE — Progress Notes (Signed)
Daily Session Note  Patient Details  Name: Wren Pryce MRN: [id number] Date of Birth: Mar 25, 1956 Referring Provider:   Flowsheet Row CARDIAC REHAB PHASE II ORIENTATION from 10/09/2022 in [facility]  Referring Provider Vernell Leep, MD       Encounter Date: 10/14/2022  Check In:  Session Check In - 10/14/22 1430       Check-In   Supervising physician immediately available to respond to emergencies [facility] MD immediately available    Physician(s) Diona Browner, NP    Location [facility]    Staff Present Sandy Salaam, MS, Exercise Physiologist;Mary Margette Fast, RN, Deland Pretty, MS, ACSM-CEP, Exercise Physiologist;Johnny Starleen Blue, MS, Exercise Physiologist;Deanette Tullius, RN, BSN    Virtual Visit No    Medication changes reported     No    Fall or balance concerns reported    No    Tobacco Cessation No Change    Warm-up and Cool-down Performed as group-led instruction    Resistance Training Performed Yes    VAD Patient? No    PAD/SET Patient? No      Pain Assessment   Currently in Pain? No/denies    Pain Score 0-No pain    Multiple Pain Sites No             Capillary Blood Glucose: No results found for this or any previous visit (from the past 24 hour(s)).   Exercise Prescription Changes - 10/14/22 1600       Response to Exercise   Blood Pressure (Admit) 122/58    Blood Pressure (Exercise) 158/68    Blood Pressure (Exit) 122/82    Heart Rate (Admit) 76 bpm    Heart Rate (Exercise) 108 bpm    Heart Rate (Exit) 81 bpm    Rating of Perceived Exertion (Exercise) 13    Perceived Dyspnea (Exercise) 0    Symptoms none    Comments Pt first day in CRP2 program.    Duration Continue with 30 min of aerobic exercise without signs/symptoms of physical distress.    Intensity THRR unchanged      Progression   Progression Continue to progress workloads to maintain intensity without signs/symptoms of  physical distress.    Average METs 2.62      Resistance Training   Training Prescription Yes    Weight 3    Reps 10-15    Time 10 Minutes      Interval Training   Interval Training No      Treadmill   MPH 2    Grade 0    Minutes 10   Pt request different equipment, did not like treadmill   METs 2.53      Recumbant Bike   Level 3    RPM 61    Watts 62    Minutes 7    METs 3.2      NuStep   Level 2    Minutes 15    METs 2.1             Social History   Tobacco Use  Smoking Status Former   Packs/day: 0.50   Years: 36.00   Total pack years: 18.00   Types: Cigarettes   Quit date: 06/28/2022   Years since quitting: 0.2  Smokeless Tobacco Never    Goals Met:  Exercise tolerated well No report of concerns or symptoms today Strength training completed today  Goals Unmet:  Not Applicable  Comments: Pt started  cardiac rehab today.  Pt tolerated light exercise without difficulty. VSS, telemetry-Sinus rhythm, asymptomatic.  Medication list reconciled. Pt denies barriers to medicaiton compliance.  PSYCHOSOCIAL ASSESSMENT:  PHQ-13. On Cymbalta will review quality of life questionnaire in the upcoming week.    Pt enjoys music,movies,reading and u tube.   Pt oriented to exercise equipment and routine.    Understanding verbalized.Harrell Gave RN BSN    Dr. Fransico Him is Medical Director for Cardiac Rehab at [facility].

## 2022-10-16 ENCOUNTER — Encounter (HOSPITAL_COMMUNITY): Payer: Medicaid Other

## 2022-10-18 ENCOUNTER — Encounter (HOSPITAL_COMMUNITY)
Admission: RE | Admit: 2022-10-18 | Discharge: 2022-10-18 | Disposition: A | Discharge status: Home / Self Care | Payer: Medicaid Other | Source: Ambulatory Visit | Attending: Cardiology | Admitting: Cardiology

## 2022-10-18 DIAGNOSIS — I214 Non-ST elevation (NSTEMI) myocardial infarction: Secondary | ICD-10-CM

## 2022-10-18 DIAGNOSIS — Z951 Presence of aortocoronary bypass graft: Secondary | ICD-10-CM

## 2022-10-21 ENCOUNTER — Encounter (HOSPITAL_COMMUNITY)
Admission: RE | Admit: 2022-10-21 | Discharge: 2022-10-21 | Disposition: A | Discharge status: Home / Self Care | Payer: Medicaid Other | Source: Ambulatory Visit | Attending: Cardiology | Admitting: Cardiology

## 2022-10-21 DIAGNOSIS — I214 Non-ST elevation (NSTEMI) myocardial infarction: Secondary | ICD-10-CM | POA: Diagnosis not present

## 2022-10-21 DIAGNOSIS — Z951 Presence of aortocoronary bypass graft: Secondary | ICD-10-CM

## 2022-10-21 NOTE — Progress Notes (Signed)
QUALITY OF LIFE SCORE REVIEW  Pt completed Quality of Life survey as a participant in Cardiac Rehab.  Scores 21.0 or below are considered low.  Pt score very low in several areas Overall 17.28, Health and Function 15.57, socioeconomic 13.79, physiological and spiritual 13.79, family 24.00. Patient quality of life slightly altered by physical constraints which limits ability to perform as prior to recent cardiac illness. 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 Lochlyn is taking Cymbalta which she says is controlling her depression. Emerii says that she received counseling for several years which she found to be helpful. Cerita says she does not need counseling right now she will reach out to her provider if needed  Khaniya says she is still experiencing some right sided chest soreness that has been going on since her surgery. Phala hopes that participating in cardiac rehab will increase her stamina.  Offered emotional support and reassurance.  Will continue to monitor and intervene as necessary. Alexsandra stopped smoking in October she wonders whether she should try using a nicotine patch she will discuss with  her primary care provider tomorrow.  Chelsei was commended on her continued smoking cessation success. Markia does not want her quality of life scores forwarded to her primary care provider at this time.Will continue to monitor the patient throughout  the program. Harrell Gave RN BSN

## 2022-10-23 ENCOUNTER — Encounter (HOSPITAL_COMMUNITY)
Admission: RE | Admit: 2022-10-23 | Discharge: 2022-10-23 | Disposition: A | Discharge status: Home / Self Care | Payer: Medicaid Other | Source: Ambulatory Visit | Attending: Cardiology | Admitting: Cardiology

## 2022-10-23 DIAGNOSIS — I214 Non-ST elevation (NSTEMI) myocardial infarction: Secondary | ICD-10-CM

## 2022-10-23 DIAGNOSIS — Z951 Presence of aortocoronary bypass graft: Secondary | ICD-10-CM

## 2022-10-25 ENCOUNTER — Encounter (HOSPITAL_COMMUNITY): Payer: Medicaid Other

## 2022-10-28 ENCOUNTER — Encounter (HOSPITAL_COMMUNITY)
Admission: RE | Admit: 2022-10-28 | Discharge: 2022-10-28 | Disposition: A | Discharge status: Home / Self Care | Payer: Medicaid Other | Source: Ambulatory Visit | Attending: Cardiology | Admitting: Cardiology

## 2022-10-28 DIAGNOSIS — Z951 Presence of aortocoronary bypass graft: Secondary | ICD-10-CM

## 2022-10-28 DIAGNOSIS — I214 Non-ST elevation (NSTEMI) myocardial infarction: Secondary | ICD-10-CM | POA: Diagnosis not present

## 2022-10-30 ENCOUNTER — Encounter (HOSPITAL_COMMUNITY)
Admission: RE | Admit: 2022-10-30 | Discharge: 2022-10-30 | Disposition: A | Discharge status: Home / Self Care | Payer: Medicaid Other | Source: Ambulatory Visit | Attending: Cardiology | Admitting: Cardiology

## 2022-10-30 DIAGNOSIS — Z951 Presence of aortocoronary bypass graft: Secondary | ICD-10-CM

## 2022-10-30 DIAGNOSIS — I214 Non-ST elevation (NSTEMI) myocardial infarction: Secondary | ICD-10-CM | POA: Diagnosis not present

## 2022-11-01 ENCOUNTER — Encounter (HOSPITAL_COMMUNITY): Payer: Medicaid Other

## 2022-11-04 ENCOUNTER — Encounter (HOSPITAL_COMMUNITY): Payer: Medicaid Other

## 2022-11-04 ENCOUNTER — Telehealth (HOSPITAL_COMMUNITY): Payer: Self-pay | Admitting: *Deleted

## 2022-11-04 NOTE — Telephone Encounter (Signed)
Patient notices that her bottom right eye lid is somewhat painful to touch. There is a small white pin point area on the center of his eyelid. Yaqueline said that she will follow up with her primary care provider or eye doctor and will not exercise today.Harrell Gave RN BSN

## 2022-11-05 NOTE — Progress Notes (Signed)
Cardiac Individual Treatment Plan  Patient Details  Name: Joyful Serra MRN: [id number] Date of Birth: 27-Oct-1955 Referring Provider:   Flowsheet Row CARDIAC REHAB PHASE II ORIENTATION from 10/09/2022 in [facility] [place]  Referring Provider Vernell Leep, MD       Initial Encounter Date:  [place] PHASE II ORIENTATION from 10/09/2022 in [facility]  Date 10/09/22       Visit Diagnosis: NSTEMI (non-ST elevated myocardial infarction) ([place])  S/P CABG x 3  Patient's Home Medications on Admission:  Current Outpatient Medications:    ALPRAZolam (XANAX) 1 MG tablet, Take 1 mg by mouth as needed for sleep. , Disp: , Rfl:    aspirin EC 81 MG tablet, Take 81 mg by mouth daily., Disp: , Rfl:    clopidogrel (PLAVIX) 75 MG tablet, Take 75 mg by mouth daily., Disp: , Rfl:    colchicine 0.6 MG tablet, Take 1 tablet (0.6 mg total) by mouth daily., Disp: 30 tablet, Rfl: 3   DULoxetine (CYMBALTA) 60 MG capsule, Take 60 mg by mouth daily., Disp: , Rfl:    Evolocumab (REPATHA SURECLICK) [id number] MG/ML SOAJ, Inject 140 mg into the skin every 14 (fourteen) days., Disp: 6 mL, Rfl: 5   ezetimibe (ZETIA) 10 MG tablet, Take 10 mg by mouth daily., Disp: , Rfl:    fluticasone (FLONASE) 50 MCG/ACT nasal spray, Place 2 sprays into both nostrils daily., Disp: , Rfl:    gabapentin (NEURONTIN) 600 MG tablet, Take 600 mg by mouth daily., Disp: , Rfl:    hydrochlorothiazide (HYDRODIURIL) 25 MG tablet, Take 25 mg by mouth daily., Disp: , Rfl:    lisinopril (ZESTRIL) 40 MG tablet, Take 40 mg by mouth daily., Disp: , Rfl:    loratadine (CLARITIN) 10 MG tablet, Take 10 mg by mouth as needed for allergies., Disp: , Rfl:    metoprolol succinate (TOPROL-XL) 100 MG 24 hr tablet, Take 100 mg by mouth daily., Disp: , Rfl:   Past Medical History: Past Medical History:  Diagnosis Date   Anxiety     Arthritis    "all my joints"   Breast cancer, right breast ([place]) 2001   S/P lumpecbomy and radiation   Depression    Fibromyalgia    GERD (gastroesophageal reflux disease)    High cholesterol    Hypertension    "just when I had radiation in 2001"   Migraine    "when I don't take my ASA qd" (08/15/2014)   Numbness and tingling in left hand    Spinal headache    Stroke ([place]) ~ 2013   "silent stroke" per her neurologist    Tobacco Use: Social History   Tobacco Use  Smoking Status Former   Packs/day: 0.50   Years: 36.00   Total pack years: 18.00   Types: Cigarettes   Quit date: 06/28/2022   Years since quitting: 0.3  Smokeless Tobacco Never    Labs: Review Flowsheet       Latest Ref Rng & Units 03/31/2020  Labs for ITP Cardiac and Pulmonary Rehab  Cholestrol 100 - 199 mg/dL 214   LDL (calc) 0 - 99 mg/dL 130   HDL-C >39 mg/dL 37   Trlycerides 0 - 149 mg/dL 265     Capillary Blood Glucose: No results found for: "GLUCAP"   Exercise Target Goals: Exercise Program Goal: Individual exercise prescription set using results from initial 6 min  walk test and THRR while considering  patient's activity barriers and safety.   Exercise Prescription Goal: Initial exercise prescription builds to 30-45 minutes a day of aerobic activity, 2-3 days per week.  Home exercise guidelines will be given to patient during program as part of exercise prescription that the participant will acknowledge.  Activity Barriers & Risk Stratification:  Activity Barriers & Cardiac Risk Stratification - 10/09/22 1401       Activity Barriers & Cardiac Risk Stratification   Activity Barriers Arthritis;Right Hip Replacement;Neck/Spine Problems;Joint Problems;History of Falls    Cardiac Risk Stratification High   <5 METs on 6MWT            6 Minute Walk:  6 Minute Walk     Row Name 10/09/22 1400         6 Minute Walk   Phase Initial     Distance 1320 feet     Walk Time 6 minutes      # of Rest Breaks 1  leg cramp, 4:15-4:26     MPH 2.5     METS 3.09     RPE 9     Perceived Dyspnea  0     VO2 Peak 10.83     Symptoms Yes (comment)     Comments R calf cramp 9/10 pain. took break, resolved with rest     Resting HR 73 bpm     Resting BP 132/74     Resting Oxygen Saturation  98 %     Exercise Oxygen Saturation  during 6 min walk 99 %     Max Ex. HR 104 bpm     Max Ex. BP 154/80     2 Minute Post BP 130/68              Oxygen Initial Assessment:   Oxygen Re-Evaluation:   Oxygen Discharge (Final Oxygen Re-Evaluation):   Initial Exercise Prescription:  Initial Exercise Prescription - 10/09/22 1400       Date of Initial Exercise RX and Referring Provider   Date 10/09/22    Referring Provider Vernell Leep, MD    Expected Discharge Date 12/06/22      Treadmill   MPH 2    Grade 0    Minutes 15    METs 3      [place]   Level 1    Minutes 15    METs 2      Prescription Details   Frequency (times per week) 3    Duration Progress to 30 minutes of continuous aerobic without signs/symptoms of physical distress      Intensity   THRR 40-80% of Max Heartrate 62-125    Ratings of Perceived Exertion 11-13    Perceived Dyspnea 0-4      Progression   Progression Continue to progress workloads to maintain intensity without signs/symptoms of physical distress.      Resistance Training   Training Prescription Yes    Weight 3    Reps 10-15             Perform Capillary Blood Glucose checks as needed.  Exercise Prescription Changes:   Exercise Prescription Changes     Row Name 10/14/22 1600 10/30/22 1629           Response to Exercise   Blood Pressure (Admit) 122/58 136/84      Blood Pressure (Exercise) 158/68 128/82      Blood Pressure (Exit) 122/82 130/86      Heart Rate (Admit)  76 bpm 70 bpm      Heart Rate (Exercise) 108 bpm 116 bpm      Heart Rate (Exit) 81 bpm 79 bpm      Rating of Perceived Exertion (Exercise) 13 12       Perceived Dyspnea (Exercise) 0 0      Symptoms none none      Comments Pt first day in CRP2 program. Reviewed MET's and goals      Duration Continue with 30 min of aerobic exercise without signs/symptoms of physical distress. Continue with 30 min of aerobic exercise without signs/symptoms of physical distress.      Intensity THRR unchanged THRR unchanged        Progression   Progression Continue to progress workloads to maintain intensity without signs/symptoms of physical distress. Continue to progress workloads to maintain intensity without signs/symptoms of physical distress.      Average METs 2.62 3.1        Resistance Training   Training Prescription Yes No      Weight 3 --      Reps 10-15 --      Time 10 Minutes --        Interval Training   Interval Training No No        Treadmill   MPH 2 --      Grade 0 --      Minutes 10  Pt request different equipment, did not like treadmill --  Pt request different equipment, did not like treadmill      METs 2.53 --        Recumbant Bike   Level 3 --      RPM 61 --      Watts 62 --      Minutes 7 --      METs 3.2 --        NuStep   Level 2 3      SPM -- 107      Minutes 15 15      METs 2.1 2.4        Recumbant Elliptical   Level -- 1      RPM -- 70      Watts -- 99      Minutes -- 15      METs -- 3.8               Exercise Comments:   Exercise Comments     Row Name 10/14/22 1644 10/30/22 1635         Exercise Comments Pt first day in CRP2 program. Pt tolerated exercise well with an average MET level of 2.62. Pt is learning THRR, ExRX, and RPE scale. Pt is off to a good start. Was initially planned to use treadmill and nustep, however pt requested change off treadmill due to not liking it. Pt switched to recumbant bike and enjoyed it much better. Will continue to monitor and progress workloads as tolerated without s/sx. Reviewed MET's and goals. Pt tolerated exercise well with an average MET level of 3.1. Pt feels  good about her goals of gaining strength and stamina, tried the [place] today and she enjoyed the new equipment               Exercise Goals and Review:   Exercise Goals     Row Name 10/09/22 1404             Exercise Goals   Increase Physical Activity Yes  Intervention Provide advice, education, support and counseling about physical activity/exercise needs.;Develop an individualized exercise prescription for aerobic and resistive training based on initial evaluation findings, risk stratification, comorbidities and participant's personal goals.       Expected Outcomes Short Term: Attend rehab on a regular basis to increase amount of physical activity.;Long Term: Exercising regularly at least 3-5 days a week.;Long Term: Add in home exercise to make exercise part of routine and to increase amount of physical activity.       Increase Strength and Stamina Yes       Intervention Provide advice, education, support and counseling about physical activity/exercise needs.;Develop an individualized exercise prescription for aerobic and resistive training based on initial evaluation findings, risk stratification, comorbidities and participant's personal goals.       Expected Outcomes Short Term: Increase workloads from initial exercise prescription for resistance, speed, and METs.;Long Term: Improve cardiorespiratory fitness, muscular endurance and strength as measured by increased METs and functional capacity (6MWT);Short Term: Perform resistance training exercises routinely during rehab and add in resistance training at home       Able to understand and use rate of perceived exertion (RPE) scale Yes       Intervention Provide education and explanation on how to use RPE scale       Expected Outcomes Short Term: Able to use RPE daily in rehab to express subjective intensity level;Long Term:  Able to use RPE to guide intensity level when exercising independently       Knowledge and understanding  of Target Heart Rate Range (THRR) Yes       Intervention Provide education and explanation of THRR including how the numbers were predicted and where they are located for reference       Expected Outcomes Short Term: Able to state/look up THRR;Long Term: Able to use THRR to govern intensity when exercising independently;Short Term: Able to use daily as guideline for intensity in rehab       Understanding of Exercise Prescription Yes       Intervention Provide education, explanation, and written materials on patient's individual exercise prescription       Expected Outcomes Short Term: Able to explain program exercise prescription;Long Term: Able to explain home exercise prescription to exercise independently                Exercise Goals Re-Evaluation :  Exercise Goals Re-Evaluation     Row Name 10/14/22 1643 10/30/22 1632           Exercise Goal Re-Evaluation   Exercise Goals Review Increase Physical Activity;Understanding of Exercise Prescription;Increase Strength and Stamina;Knowledge and understanding of Target Heart Rate Range (THRR);Able to understand and use rate of perceived exertion (RPE) scale Increase Physical Activity;Understanding of Exercise Prescription;Increase Strength and Stamina;Knowledge and understanding of Target Heart Rate Range (THRR);Able to understand and use rate of perceived exertion (RPE) scale      Comments Pt first day in CRP2 program. Pt tolerated exercise well with an average MET level of 2.62. Pt is learning her ExRx, THRR, and RPE scale. Reviewed MET's and goals. Pt tolerated exercise well with an average MET level of 3.1. Attempted to review home exercise, but pt wanted to test out treadmill here before beginning at home on her own. Since the weather has been cold she wants to add in treadmill and outdoor walking for exercise (as weather permits). Pt feels good about her goals of gaining strength and stamina, tried the [place] today and she enjoyed the new  equipment      Expected Outcomes Will continue to monitor and progress workloads as tolerated without s/sx. Will continue to monitor and progress workloads as tolerated without s/sx.               Discharge Exercise Prescription (Final Exercise Prescription Changes):  Exercise Prescription Changes - 10/30/22 1629       Response to Exercise   Blood Pressure (Admit) 136/84    Blood Pressure (Exercise) 128/82    Blood Pressure (Exit) 130/86    Heart Rate (Admit) 70 bpm    Heart Rate (Exercise) 116 bpm    Heart Rate (Exit) 79 bpm    Rating of Perceived Exertion (Exercise) 12    Perceived Dyspnea (Exercise) 0    Symptoms none    Comments Reviewed MET's and goals    Duration Continue with 30 min of aerobic exercise without signs/symptoms of physical distress.    Intensity THRR unchanged      Progression   Progression Continue to progress workloads to maintain intensity without signs/symptoms of physical distress.    Average METs 3.1      Resistance Training   Training Prescription No      Interval Training   Interval Training No      Treadmill   Minutes --   Pt request different equipment, did not like treadmill     NuStep   Level 3    SPM 107    Minutes 15    METs 2.4      Recumbant Elliptical   Level 1    RPM 70    Watts 99    Minutes 15    METs 3.8             Nutrition:  Target Goals: Understanding of nutrition guidelines, daily intake of sodium '1500mg'$ , cholesterol '200mg'$ , calories 30% from fat and 7% or less from saturated fats, daily to have 5 or more servings of fruits and vegetables.  Biometrics:  Pre Biometrics - 10/09/22 1057       Pre Biometrics   Waist Circumference 43.5 inches    Hip Circumference 46 inches    Waist to Hip Ratio 0.95 %    Triceps Skinfold 44 mm    % Body Fat 47.1 %    Grip Strength 30 kg    Flexibility 13.5 in    Single Leg Stand 18.25 seconds              Nutrition Therapy Plan and Nutrition Goals:   Nutrition Therapy & Goals - 10/15/22 0852       Nutrition Therapy   Diet Heart Healthy/Carbohydrate Consistent      Personal Nutrition Goals   Nutrition Goal Patient to identify strategies for reducing cardiovascular risk by attending the weekly [employer] education and nutrition series    Personal Goal #2 Patient to improve diet quality by using the plate method as a daily guide for meal planning to include lean protein/plant protein, fruits, vegetables, whole grains, nonfat dairy as part of well balanced diet    Personal Goal #3 Patient to identify food sources and limit daily intake of saturated fat, trans fat, sodium, and refined carbohydrates    Personal Goal #4 Patient to reduce sodium intake to '1500mg'$  per day    Comments Deianira reports that she enjoys cooking. Her A1c is in a diabetic range and is not currently being treated; recommended follow-up with PCP. She does report history of diabetes when you lived in [place]. She  will benefit from participation in intensive cardiac rehab for nutrition, exercise, and lifestyle modification.      Intervention Plan   Intervention Prescribe, educate and counsel regarding individualized specific dietary modifications aiming towards targeted core components such as weight, hypertension, lipid management, diabetes, heart failure and other comorbidities.;Nutrition handout(s) given to patient.    Expected Outcomes Short Term Goal: Understand basic principles of dietary content, such as calories, fat, sodium, cholesterol and nutrients.;Long Term Goal: Adherence to prescribed nutrition plan.             Nutrition Assessments:  MEDIFICTS Score Key: ?70 Need to make dietary changes  40-70 Heart Healthy Diet ? 40 Therapeutic Level Cholesterol Diet    Picture Your Plate Scores: [id number] Unhealthy dietary pattern with much room for improvement. 41-50 Dietary pattern unlikely to meet recommendations for good health and room for improvement. 51-60 More  healthful dietary pattern, with some room for improvement.  >60 Healthy dietary pattern, although there may be some specific behaviors that could be improved.    Nutrition Goals Re-Evaluation:  Nutrition Goals Re-Evaluation     [place] Name 10/15/22 0852             Goals   Current Weight 211 lb 13.8 oz (96.1 kg)       Comment A1c 6.8, triglycerides 417, LDL 155       Expected Outcome Kevina reports that she enjoys cooking. Her A1c is in a diabetic range and is not currently being treated; recommended follow-up with PCP. She does report history of diabetes when you lived in [place]. She will benefit from participation in intensive cardiac rehab for nutrition, exercise, and lifestyle modification.                Nutrition Goals Re-Evaluation:  Nutrition Goals Re-Evaluation     [place] Name 10/15/22 0852             Goals   Current Weight 211 lb 13.8 oz (96.1 kg)       Comment A1c 6.8, triglycerides 417, LDL 155       Expected Outcome Everette reports that she enjoys cooking. Her A1c is in a diabetic range and is not currently being treated; recommended follow-up with PCP. She does report history of diabetes when you lived in [place]. She will benefit from participation in intensive cardiac rehab for nutrition, exercise, and lifestyle modification.                Nutrition Goals Discharge (Final Nutrition Goals Re-Evaluation):  Nutrition Goals Re-Evaluation - 10/15/22 [id number]       Goals   Current Weight 211 lb 13.8 oz (96.1 kg)    Comment A1c 6.8, triglycerides 417, LDL 155    Expected Outcome Atalya reports that she enjoys cooking. Her A1c is in a diabetic range and is not currently being treated; recommended follow-up with PCP. She does report history of diabetes when you lived in [place]. She will benefit from participation in intensive cardiac rehab for nutrition, exercise, and lifestyle modification.             Psychosocial: Target Goals: Acknowledge presence or  absence of significant depression and/or stress, maximize coping skills, provide positive support system. Participant is able to verbalize types and ability to use techniques and skills needed for reducing stress and depression.  Initial Review & Psychosocial Screening:  Initial Psych Review & Screening - 10/09/22 1405       Initial Review   Current issues with History of  Depression;Current Depression;Current Psychotropic Meds      Family Dynamics   Good Support System? Yes   Makenzye has her son who she lives with for support   Comments Dhruvi stated that after her surgery she had trouble coming to terms with her diagnoses and accepting that she had a heart attack. She states it has gotten better in the months following and she has been slowly coming to terms with her diagnosis. She also has some stress regarding finances. Support was offered regarding her depression/anxiety but Sharnel denies at this time. She is taking her Xanax and Cymbalta and feels her medications are working for her.      Barriers   Psychosocial barriers to participate in program The patient should benefit from training in stress management and relaxation.      Screening Interventions   Interventions Encouraged to exercise;To provide support and resources with identified psychosocial needs;Provide feedback about the scores to participant    Expected Outcomes Short Term goal: Utilizing psychosocial counselor, staff and physician to assist with identification of specific Stressors or current issues interfering with healing process. Setting desired goal for each stressor or current issue identified.;Long Term Goal: Stressors or current issues are controlled or eliminated.;Short Term goal: Identification and review with participant of any Quality of Life or Depression concerns found by scoring the questionnaire.;Long Term goal: The participant improves quality of Life and PHQ9 Scores as seen by post scores and/or verbalization of  changes             Quality of Life Scores:  Quality of Life - 10/09/22 1413       Quality of Life   Select Quality of Life      Quality of Life Scores   Health/Function Pre 15.57 %    Socioeconomic Pre 20.19 %    Psych/Spiritual Pre 13.79 %    Family Pre 24 %    GLOBAL Pre 17.28 %            Scores of 19 and below usually indicate a poorer quality of life in these areas.  A difference of  2-3 points is a clinically meaningful difference.  A difference of 2-3 points in the total score of the Quality of Life Index has been associated with significant improvement in overall quality of life, self-image, physical symptoms, and general health in studies assessing change in quality of life.  PHQ-9: Review Flowsheet       10/09/2022  Depression screen PHQ 2/9  Decreased Interest 2  Down, Depressed, Hopeless 1  PHQ - 2 Score 3  Altered sleeping 2  Tired, decreased energy 2  Change in appetite 2  Feeling bad or failure about yourself  2  Trouble concentrating 1  Moving slowly or fidgety/restless 1  Suicidal thoughts 0  PHQ-9 Score 13  Difficult doing work/chores Somewhat difficult   Interpretation of Total Score  Total Score Depression Severity:  1-4 = Minimal depression, 5-9 = Mild depression, 10-14 = Moderate depression, 15-19 = Moderately severe depression, 20-27 = Severe depression   Psychosocial Evaluation and Intervention:   Psychosocial Re-Evaluation:  Psychosocial Re-Evaluation     [place] Name 10/28/22 1158             Psychosocial Re-Evaluation   Current issues with History of Depression;Current Psychotropic Meds;Current Depression       Comments Quality of life questionnaire reviewed. Mirai says cymbalta is currently controlling her depression       Expected Outcomes Sameka will have controlled  or decreased depression upon completion of intensive cardiac rehab       Interventions Stress management education;Relaxation education;Encouraged to attend  Cardiac Rehabilitation for the exercise       Continue Psychosocial Services  Follow up required by staff                Psychosocial Discharge (Final Psychosocial Re-Evaluation):  Psychosocial Re-Evaluation - 10/28/22 1158       Psychosocial Re-Evaluation   Current issues with History of Depression;Current Psychotropic Meds;Current Depression    Comments Quality of life questionnaire reviewed. Lexey says cymbalta is currently controlling her depression    Expected Outcomes Karnisha will have controlled or decreased depression upon completion of intensive cardiac rehab    Interventions Stress management education;Relaxation education;Encouraged to attend Cardiac Rehabilitation for the exercise    Continue Psychosocial Services  Follow up required by staff             Vocational Rehabilitation: Provide vocational rehab assistance to qualifying candidates.   Vocational Rehab Evaluation & Intervention:  Vocational Rehab - 10/09/22 1415       Initial Vocational Rehab Evaluation & Intervention   Assessment shows need for Vocational Rehabilitation No   Nissi is on disability currently. She is not sure when she will return to work and does not need vocational rehab            Education: Education Goals: Education classes will be provided on a weekly basis, covering required topics. Participant will state understanding/return demonstration of topics presented.    Education     Row Name 10/14/22 1500     Education   Cardiac Education Topics Pritikin   Nurse, children's   Select Nutrition   Nutrition Nutrition Action Plan   Instruction Review Code 1- Verbalizes Understanding   Class Start Time 1400   Class Stop Time 1447   Class Time Calculation (min) 47 min    [place] Name 10/18/22 1600     Education   Cardiac Education Topics Pritikin   Architect  Education   General Education Hypertension and Heart Disease   Instruction Review Code 1- Verbalizes Understanding   Class Start Time 1410   Class Stop Time 1450   Class Time Calculation (min) 40 min    [place] Name 10/21/22 1500     Education   Cardiac Education Topics Pritikin   Environmental consultant Psychosocial   Psychosocial Workshop Healthy Sleep for a Healthy Heart   Instruction Review Code 1- Verbalizes Understanding   Class Start Time 1400   Class Stop Time 1450   Class Time Calculation (min) 50 min    [place] Name 10/23/22 1500     Education   Cardiac Education Topics Pritikin   Financial trader   Weekly Topic Comforting Weekend Breakfasts   Instruction Review Code 1- Verbalizes Understanding   Class Start Time 1400   Class Stop Time 1445   Class Time Calculation (min) 45 min    [place] Name 10/28/22 1600     Education   Cardiac Education Topics Pritikin   Academic librarian Exercise Education   Exercise Education [employer]  Limitations   Instruction Review Code 1- Verbalizes Understanding   Class Start Time 1406   Class Stop Time [id number]   Class Time Calculation (min) 36 min    [place] Name 10/30/22 1600     Education   Cardiac Education Topics Pritikin   Financial trader   Weekly Topic Fast Evening Meals   Instruction Review Code 1- Verbalizes Understanding   Class Start Time 1400   Class Stop Time 1445   Class Time Calculation (min) 45 min            Core Videos: Exercise    Move It!  Clinical staff conducted group or individual video education with verbal and written material and guidebook.  Patient learns the recommended Pritikin exercise program. Exercise with the goal of living a long, healthy life. Some of the health benefits of exercise include  controlled diabetes, healthier blood pressure levels, improved cholesterol levels, improved heart and lung capacity, improved sleep, and better body composition. Everyone should speak with their doctor before starting or changing an exercise routine.  Biomechanical Limitations Clinical staff conducted group or individual video education with verbal and written material and guidebook.  Patient learns how biomechanical limitations can impact exercise and how we can mitigate and possibly overcome limitations to have an impactful and balanced exercise routine.  Body Composition Clinical staff conducted group or individual video education with verbal and written material and guidebook.  Patient learns that body composition (ratio of muscle mass to fat mass) is a key component to assessing overall fitness, rather than body weight alone. Increased fat mass, especially visceral belly fat, can put [place] at increased risk for metabolic syndrome, type 2 diabetes, heart disease, and even death. It is recommended to combine diet and exercise (cardiovascular and resistance training) to improve your body composition. Seek guidance from your physician and exercise physiologist before implementing an exercise routine.  Exercise Action Plan Clinical staff conducted group or individual video education with verbal and written material and guidebook.  Patient learns the recommended strategies to achieve and enjoy long-term exercise adherence, including variety, self-motivation, self-efficacy, and positive decision making. Benefits of exercise include fitness, good health, weight management, more energy, better sleep, less stress, and overall well-being.  Medical   Heart Disease Risk Reduction Clinical staff conducted group or individual video education with verbal and written material and guidebook.  Patient learns our heart is our most vital organ as it circulates oxygen, nutrients, white blood cells, and hormones  throughout the entire body, and carries waste away. Data supports a plant-based eating plan like the Pritikin Program for its effectiveness in slowing progression of and reversing heart disease. The video provides a number of recommendations to address heart disease.   Metabolic Syndrome and Belly Fat  Clinical staff conducted group or individual video education with verbal and written material and guidebook.  Patient learns what metabolic syndrome is, how it leads to heart disease, and how one can reverse it and keep it from coming back. You have metabolic syndrome if you have 3 of the following 5 criteria: abdominal obesity, high blood pressure, high triglycerides, low HDL cholesterol, and high blood sugar.  Hypertension and Heart Disease Clinical staff conducted group or individual video education with verbal and written material and guidebook.  Patient learns that high blood pressure, or hypertension, is very common in the [place]. Hypertension is largely due to excessive salt intake, but other important risk factors  include being overweight, physical inactivity, drinking too much alcohol, smoking, and not eating enough potassium from fruits and vegetables. High blood pressure is a leading risk factor for heart attack, stroke, congestive heart failure, dementia, kidney failure, and premature death. Long-term effects of excessive salt intake include stiffening of the arteries and thickening of heart muscle and organ damage. Recommendations include ways to reduce hypertension and the risk of heart disease.  Diseases of Our Time - Focusing on Diabetes Clinical staff conducted group or individual video education with verbal and written material and guidebook.  Patient learns why the best way to stop diseases of our time is prevention, through food and other lifestyle changes. Medicine (such as prescription pills and surgeries) is often only a Band-Aid on the problem, not a long-term solution. Most  common diseases of our time include obesity, type 2 diabetes, hypertension, heart disease, and cancer. The Pritikin Program is recommended and has been proven to help reduce, reverse, and/or prevent the damaging effects of metabolic syndrome.  Nutrition   Overview of the Pritikin Eating Plan  Clinical staff conducted group or individual video education with verbal and written material and guidebook.  Patient learns about the [place] for disease risk reduction. The [place] emphasizes a wide variety of unrefined, minimally-processed carbohydrates, like fruits, vegetables, whole grains, and legumes. Go, Caution, and Stop food choices are explained. Plant-based and lean animal proteins are emphasized. Rationale provided for low sodium intake for blood pressure control, low added sugars for blood sugar stabilization, and low added fats and oils for coronary artery disease risk reduction and weight management.  Calorie Density  Clinical staff conducted group or individual video education with verbal and written material and guidebook.  Patient learns about calorie density and how it impacts the Pritikin Eating Plan. Knowing the characteristics of the food you choose will help you decide whether those foods will lead to weight gain or weight loss, and whether you want to consume more or less of them. Weight loss is usually a side effect of the Pritikin Eating Plan because of its focus on low calorie-dense foods.  Label Reading  Clinical staff conducted group or individual video education with verbal and written material and guidebook.  Patient learns about the Pritikin recommended label reading guidelines and corresponding recommendations regarding calorie density, added sugars, sodium content, and whole grains.  Dining Out - Part 1  Clinical staff conducted group or individual video education with verbal and written material and guidebook.  Patient learns that restaurant meals  can be sabotaging because they can be so high in calories, fat, sodium, and/or sugar. Patient learns recommended strategies on how to positively address this and avoid unhealthy pitfalls.  Facts on Fats  Clinical staff conducted group or individual video education with verbal and written material and guidebook.  Patient learns that lifestyle modifications can be just as effective, if not more so, as many medications for lowering your risk of heart disease. A Pritikin lifestyle can help to reduce your risk of inflammation and atherosclerosis (cholesterol build-up, or plaque, in the artery walls). Lifestyle interventions such as dietary choices and physical activity address the cause of atherosclerosis. A review of the types of fats and their impact on blood cholesterol levels, along with dietary recommendations to reduce fat intake is also included.  Nutrition Action Plan  Clinical staff conducted group or individual video education with verbal and written material and guidebook.  Patient learns how to incorporate Pritikin recommendations into their lifestyle.  Recommendations include planning and keeping personal health goals in mind as an important part of their success.  Healthy Mind-Set    Healthy Minds, Bodies, Hearts  Clinical staff conducted group or individual video education with verbal and written material and guidebook.  Patient learns how to identify when they are stressed. Video will discuss the impact of that stress, as well as the many benefits of stress management. Patient will also be introduced to stress management techniques. The way we think, act, and feel has an impact on our hearts.  How Our Thoughts Can Heal Our Hearts  Clinical staff conducted group or individual video education with verbal and written material and guidebook.  Patient learns that negative thoughts can cause depression and anxiety. This can result in negative lifestyle behavior and serious health problems.  Cognitive behavioral therapy is an effective method to help control our thoughts in order to change and improve our emotional outlook.  Additional Videos:  Exercise    Improving Performance  Clinical staff conducted group or individual video education with verbal and written material and guidebook.  Patient learns to use a non-linear approach by alternating intensity levels and lengths of time spent exercising to help burn more calories and lose more body fat. Cardiovascular exercise helps improve heart health, metabolism, hormonal balance, blood sugar control, and recovery from fatigue. Resistance training improves strength, endurance, balance, coordination, reaction time, metabolism, and muscle mass. Flexibility exercise improves circulation, posture, and balance. Seek guidance from your physician and exercise physiologist before implementing an exercise routine and learn your capabilities and proper form for all exercise.  Introduction to Yoga  Clinical staff conducted group or individual video education with verbal and written material and guidebook.  Patient learns about yoga, a discipline of the coming together of mind, breath, and body. The benefits of yoga include improved flexibility, improved range of motion, better posture and core strength, increased lung function, weight loss, and positive self-image. Yoga's heart health benefits include lowered blood pressure, healthier heart rate, decreased cholesterol and triglyceride levels, improved immune function, and reduced stress. Seek guidance from your physician and exercise physiologist before implementing an exercise routine and learn your capabilities and proper form for all exercise.  Medical   Aging: Enhancing Your Quality of Life  Clinical staff conducted group or individual video education with verbal and written material and guidebook.  Patient learns key strategies and recommendations to stay in good physical health and enhance  quality of life, such as prevention strategies, having an advocate, securing a [place], and keeping a list of medications and system for tracking them. It also discusses how to avoid risk for bone loss.  Biology of Weight Control  Clinical staff conducted group or individual video education with verbal and written material and guidebook.  Patient learns that weight gain occurs because we consume more calories than we burn (eating more, moving less). Even if your body weight is normal, you may have higher ratios of fat compared to muscle mass. Too much body fat puts you at increased risk for cardiovascular disease, heart attack, stroke, type 2 diabetes, and obesity-related cancers. In addition to exercise, following the [place] can help reduce your risk.  Decoding Lab Results  Clinical staff conducted group or individual video education with verbal and written material and guidebook.  Patient learns that lab test reflects one measurement whose values change over time and are influenced by many factors, including medication, stress, sleep, exercise, food, hydration,  pre-existing medical conditions, and more. It is recommended to use the knowledge from this video to become more involved with your lab results and evaluate your numbers to speak with your doctor.   Diseases of Our Time - Overview  Clinical staff conducted group or individual video education with verbal and written material and guidebook.  Patient learns that according to the CDC, 50% to 70% of chronic diseases (such as obesity, type 2 diabetes, elevated lipids, hypertension, and heart disease) are avoidable through lifestyle improvements including healthier food choices, listening to satiety cues, and increased physical activity.  Sleep Disorders Clinical staff conducted group or individual video education with verbal and written material and guidebook.  Patient learns how good quality and  duration of sleep are important to overall health and well-being. Patient also learns about sleep disorders and how they impact health along with recommendations to address them, including discussing with a physician.  Nutrition  Dining Out - Part 2 Clinical staff conducted group or individual video education with verbal and written material and guidebook.  Patient learns how to plan ahead and communicate in order to maximize their dining experience in a healthy and nutritious manner. Included are recommended food choices based on the type of restaurant the patient is visiting.   Fueling a Best boy conducted group or individual video education with verbal and written material and guidebook.  There is a strong connection between our food choices and our health. Diseases like obesity and type 2 diabetes are very prevalent and are in large-part due to lifestyle choices. The Pritikin Eating Plan provides plenty of food and hunger-curbing satisfaction. It is easy to follow, affordable, and helps reduce health risks.  Menu Workshop  Clinical staff conducted group or individual video education with verbal and written material and guidebook.  Patient learns that restaurant meals can sabotage health goals because they are often packed with calories, fat, sodium, and sugar. Recommendations include strategies to plan ahead and to communicate with the manager, chef, or server to help order a healthier meal.  Planning Your Eating Strategy  Clinical staff conducted group or individual video education with verbal and written material and guidebook.  Patient learns about the [place] and its benefit of reducing the risk of disease. The [place] does not focus on calories. Instead, it emphasizes high-quality, nutrient-rich foods. By knowing the characteristics of the foods, we choose, we can determine their calorie density and make informed decisions.  Targeting Your  Nutrition Priorities  Clinical staff conducted group or individual video education with verbal and written material and guidebook.  Patient learns that lifestyle habits have a tremendous impact on disease risk and progression. This video provides eating and physical activity recommendations based on your personal health goals, such as reducing LDL cholesterol, losing weight, preventing or controlling type 2 diabetes, and reducing high blood pressure.  Vitamins and Minerals  Clinical staff conducted group or individual video education with verbal and written material and guidebook.  Patient learns different ways to obtain key vitamins and minerals, including through a recommended healthy diet. It is important to discuss all supplements you take with your doctor.   Healthy Mind-Set    Smoking Cessation  Clinical staff conducted group or individual video education with verbal and written material and guidebook.  Patient learns that cigarette smoking and tobacco addiction pose a serious health risk which affects millions of people. Stopping smoking will significantly reduce the risk of heart disease, lung disease, and  many forms of cancer. Recommended strategies for quitting are covered, including working with your doctor to develop a successful plan.  Culinary   Becoming a Financial trader conducted group or individual video education with verbal and written material and guidebook.  Patient learns that cooking at home can be healthy, cost-effective, quick, and puts them in control. Keys to cooking healthy recipes will include looking at your recipe, assessing your equipment needs, planning ahead, making it simple, choosing cost-effective seasonal ingredients, and limiting the use of added fats, salts, and sugars.  Cooking - Breakfast and Snacks  Clinical staff conducted group or individual video education with verbal and written material and guidebook.  Patient learns how important  breakfast is to satiety and nutrition through the entire day. Recommendations include key foods to eat during breakfast to help stabilize blood sugar levels and to prevent overeating at meals later in the day. Planning ahead is also a key component.  Cooking - Human resources officer conducted group or individual video education with verbal and written material and guidebook.  Patient learns eating strategies to improve overall health, including an approach to cook more at home. Recommendations include thinking of animal protein as a side on your plate rather than center stage and focusing instead on lower calorie dense options like vegetables, fruits, whole grains, and plant-based proteins, such as beans. Making sauces in large quantities to freeze for later and leaving the skin on your vegetables are also recommended to maximize your experience.  Cooking - Healthy Salads and Dressing Clinical staff conducted group or individual video education with verbal and written material and guidebook.  Patient learns that vegetables, fruits, whole grains, and legumes are the foundations of the [place]. Recommendations include how to incorporate each of these in flavorful and healthy salads, and how to create homemade salad dressings. Proper handling of ingredients is also covered. Cooking - Soups and [employer] - Soups and Desserts Clinical staff conducted group or individual video education with verbal and written material and guidebook.  Patient learns that Pritikin soups and desserts make for easy, nutritious, and delicious snacks and meal components that are low in sodium, fat, sugar, and calorie density, while high in vitamins, minerals, and filling fiber. Recommendations include simple and healthy ideas for soups and desserts.   Overview     The Pritikin Solution Program Overview Clinical staff conducted group or individual video education with verbal and written material  and guidebook.  Patient learns that the results of the [place] Program have been documented in more than 100 articles published in peer-reviewed journals, and the benefits include reducing risk factors for (and, in some cases, even reversing) high cholesterol, high blood pressure, type 2 diabetes, obesity, and more! An overview of the three key pillars of the Pritikin Program will be covered: eating well, doing regular exercise, and having a healthy mind-set.  WORKSHOPS  Exercise: Exercise Basics: Building Your Action Plan Clinical staff led group instruction and group discussion with PowerPoint presentation and patient guidebook. To enhance the learning environment the use of posters, models and videos may be added. At the conclusion of this workshop, patients will comprehend the difference between physical activity and exercise, as well as the benefits of incorporating both, into their routine. Patients will understand the FITT (Frequency, Intensity, Time, and Type) principle and how to use it to build an exercise action plan. In addition, safety concerns and other considerations for exercise and cardiac rehab will  be addressed by the presenter. The purpose of this lesson is to promote a comprehensive and effective weekly exercise routine in order to improve patients' overall level of fitness.   Managing Heart Disease: Your Path to a Healthier Heart Clinical staff led group instruction and group discussion with PowerPoint presentation and patient guidebook. To enhance the learning environment the use of posters, models and videos may be added.At the conclusion of this workshop, patients will understand the anatomy and physiology of the heart. Additionally, they will understand how Pritikin's three pillars impact the risk factors, the progression, and the management of heart disease.  The purpose of this lesson is to provide a high-level overview of the heart, heart disease, and how the Pritikin  lifestyle positively impacts risk factors.  Exercise Biomechanics Clinical staff led group instruction and group discussion with PowerPoint presentation and patient guidebook. To enhance the learning environment the use of posters, models and videos may be added. Patients will learn how the structural parts of their bodies function and how these functions impact their daily activities, movement, and exercise. Patients will learn how to promote a neutral spine, learn how to manage pain, and identify ways to improve their physical movement in order to promote healthy living. The purpose of this lesson is to expose patients to common physical limitations that impact physical activity. Participants will learn practical ways to adapt and manage aches and pains, and to minimize their effect on regular exercise. Patients will learn how to maintain good posture while sitting, walking, and lifting.  Balance Training and Fall Prevention  Clinical staff led group instruction and group discussion with PowerPoint presentation and patient guidebook. To enhance the learning environment the use of posters, models and videos may be added. At the conclusion of this workshop, patients will understand the importance of their sensorimotor skills (vision, proprioception, and the vestibular system) in maintaining their ability to balance as they age. Patients will apply a variety of balancing exercises that are appropriate for their current level of function. Patients will understand the common causes for poor balance, possible solutions to these problems, and ways to modify their physical environment in order to minimize their fall risk. The purpose of this lesson is to teach patients about the importance of maintaining balance as they age and ways to minimize their risk of falling.  WORKSHOPS   Nutrition:  Fueling a Scientist, research (physical sciences) led group instruction and group discussion with PowerPoint presentation  and patient guidebook. To enhance the learning environment the use of posters, models and videos may be added. Patients will review the foundational principles of the [place] and understand what constitutes a serving size in each of the food groups. Patients will also learn Pritikin-friendly foods that are better choices when away from home and review make-ahead meal and snack options. Calorie density will be reviewed and applied to three nutrition priorities: weight maintenance, weight loss, and weight gain. The purpose of this lesson is to reinforce (in a group setting) the key concepts around what patients are recommended to eat and how to apply these guidelines when away from home by planning and selecting Pritikin-friendly options. Patients will understand how calorie density may be adjusted for different weight management goals.  Mindful Eating  Clinical staff led group instruction and group discussion with PowerPoint presentation and patient guidebook. To enhance the learning environment the use of posters, models and videos may be added. Patients will briefly review the concepts of the [place] and  the importance of low-calorie dense foods. The concept of mindful eating will be introduced as well as the importance of paying attention to internal hunger signals. Triggers for non-hunger eating and techniques for dealing with triggers will be explored. The purpose of this lesson is to provide patients with the opportunity to review the basic principles of the [place], discuss the value of eating mindfully and how to measure internal cues of hunger and fullness using the Hunger Scale. Patients will also discuss reasons for non-hunger eating and learn strategies to use for controlling emotional eating.  Targeting Your Nutrition Priorities Clinical staff led group instruction and group discussion with PowerPoint presentation and patient guidebook. To enhance the  learning environment the use of posters, models and videos may be added. Patients will learn how to determine their genetic susceptibility to disease by reviewing their family history. Patients will gain insight into the importance of diet as part of an overall healthy lifestyle in mitigating the impact of genetics and other environmental insults. The purpose of this lesson is to provide patients with the opportunity to assess their personal nutrition priorities by looking at their family history, their own health history and current risk factors. Patients will also be able to discuss ways of prioritizing and modifying the [place] for their highest risk areas  Menu  Clinical staff led group instruction and group discussion with PowerPoint presentation and patient guidebook. To enhance the learning environment the use of posters, models and videos may be added. Using menus brought in from [employer], or printed from [employer], patients will apply the [place] dining out guidelines that were presented in the [employer] video. Patients will also be able to practice these guidelines in a variety of provided scenarios. The purpose of this lesson is to provide patients with the opportunity to practice hands-on learning of the [place] with actual menus and practice scenarios.  Label Reading Clinical staff led group instruction and group discussion with PowerPoint presentation and patient guidebook. To enhance the learning environment the use of posters, models and videos may be added. Patients will review and discuss the [employer] label reading guidelines presented in [employer]'s Label Reading Educational series video. Using fool labels brought in from local grocery stores and markets, patients will apply the label reading guidelines and determine if the packaged food meet the [employer] guidelines. The purpose of this lesson is to provide patients with  the opportunity to review, discuss, and practice hands-on learning of the [employer] Label Reading guidelines with actual packaged food labels. [place] Workshops are designed to teach patients ways to prepare quick, simple, and affordable recipes at home. The importance of nutrition's role in chronic disease risk reduction is reflected in its emphasis in the overall [employer] program. By learning how to prepare essential core [employer] Eating Plan recipes, patients will increase control over what they eat; be able to customize the flavor of foods without the use of added salt, sugar, or fat; and improve the quality of the food they consume. By learning a set of core recipes which are easily assembled, quickly prepared, and affordable, patients are more likely to prepare more healthy foods at home. These workshops focus on convenient breakfasts, simple entres, side dishes, and desserts which can be prepared with minimal effort and are consistent with nutrition recommendations for cardiovascular risk reduction. Cooking [employer] are taught by a Engineer, materials (RD) who has been trained  by the [employer] team. The chef or RD has a clear understanding of the importance of minimizing - if not completely eliminating - added fat, sugar, and sodium in recipes. Throughout the series of [place] Workshop sessions, patients will learn about healthy ingredients and efficient methods of cooking to build confidence in their capability to prepare    Cooking School weekly topics:  Adding Flavor- Sodium-Free  Fast and Healthy Breakfasts  Powerhouse Plant-Based Proteins  Satisfying Salads and Dressings  Simple Sides and Sauces  International Cuisine-Spotlight on the [place] Zones  Delicious Desserts  Savory Soups  Efficiency Cooking - Meals in a Snap  Tasty Appetizers and Snacks  Comforting Weekend Breakfasts  One-Pot Wonders   Fast Evening  Meals  Easy [place] (Psychosocial): New Thoughts, New Behaviors Clinical staff led group instruction and group discussion with PowerPoint presentation and patient guidebook. To enhance the learning environment the use of posters, models and videos may be added. Patients will learn and practice techniques for developing effective health and lifestyle goals. Patients will be able to effectively apply the goal setting process learned to develop at least one new personal goal.  The purpose of this lesson is to expose patients to a new skill set of behavior modification techniques such as techniques setting SMART goals, overcoming barriers, and achieving new thoughts and new behaviors.  Managing Moods and Relationships Clinical staff led group instruction and group discussion with PowerPoint presentation and patient guidebook. To enhance the learning environment the use of posters, models and videos may be added. Patients will learn how emotional and chronic stress factors can impact their health and relationships. They will learn healthy ways to manage their moods and utilize positive coping mechanisms. In addition, ICR patients will learn ways to improve communication skills. The purpose of this lesson is to expose patients to ways of understanding how one's mood and health are intimately connected. Developing a healthy outlook can help build positive relationships and connections with others. Patients will understand the importance of utilizing effective communication skills that include actively listening and being heard. They will learn and understand the importance of the "4 Cs" and especially Connections in fostering of a Healthy Mind-Set.  Healthy Sleep for a Healthy Heart Clinical staff led group instruction and group discussion with PowerPoint presentation and patient guidebook. To enhance the learning environment the use of  posters, models and videos may be added. At the conclusion of this workshop, patients will be able to demonstrate knowledge of the importance of sleep to overall health, well-being, and quality of life. They will understand the symptoms of, and treatments for, common sleep disorders. Patients will also be able to identify daytime and nighttime behaviors which impact sleep, and they will be able to apply these tools to help manage sleep-related challenges. The purpose of this lesson is to provide patients with a general overview of sleep and outline the importance of quality sleep. Patients will learn about a few of the most common sleep disorders. Patients will also be introduced to the concept of "sleep hygiene," and discover ways to self-manage certain sleeping problems through simple daily behavior changes. Finally, the workshop will motivate patients by clarifying the links between quality sleep and their goals of heart-healthy living.   Recognizing and Reducing Stress Clinical staff led group instruction and group discussion with PowerPoint presentation and patient guidebook. To enhance the learning environment the use of posters, models and videos  may be added. At the conclusion of this workshop, patients will be able to understand the types of stress reactions, differentiate between acute and chronic stress, and recognize the impact that chronic stress has on their health. They will also be able to apply different coping mechanisms, such as reframing negative self-talk. Patients will have the opportunity to practice a variety of stress management techniques, such as deep abdominal breathing, progressive muscle relaxation, and/or guided imagery.  The purpose of this lesson is to educate patients on the role of stress in their lives and to provide healthy techniques for coping with it.  Learning Barriers/Preferences:  Learning Barriers/Preferences - 10/09/22 1414       Learning Barriers/Preferences    Learning Barriers Sight   pt wears reading glasses   Learning Preferences Audio;Computer/Internet;Group Instruction;Individual Instruction;Pictoral;Skilled Demonstration;Verbal Instruction;Video;Written Material             Education Topics:  Knowledge Questionnaire Score:  Knowledge Questionnaire Score - 10/09/22 1414       Knowledge Questionnaire Score   Pre Score 20/24             Core Components/Risk Factors/Patient Goals at Admission:  Personal Goals and Risk Factors at Admission - 10/09/22 1416       Core Components/Risk Factors/Patient Goals on Admission    Weight Management Yes;Obesity;Weight Loss    Intervention Obesity: Provide education and appropriate resources to help participant work on and attain dietary goals.;Weight Management/Obesity: Establish reasonable short term and long term weight goals.;Weight Management: Provide education and appropriate resources to help participant work on and attain dietary goals.;Weight Management: Develop a combined nutrition and exercise program designed to reach desired caloric intake, while maintaining appropriate intake of nutrient and fiber, sodium and fats, and appropriate energy expenditure required for the weight goal.    Goal Weight: Short Term 180 lb (81.6 kg)    Expected Outcomes Short Term: Continue to assess and modify interventions until short term weight is achieved;Long Term: Adherence to nutrition and physical activity/exercise program aimed toward attainment of established weight goal;Weight Loss: Understanding of general recommendations for a balanced deficit meal plan, which promotes 1-2 lb weight loss per week and includes a negative energy balance of [phone] kcal/d;Understanding recommendations for meals to include 15-35% energy as protein, 25-35% energy from fat, 35-60% energy from carbohydrates, less than '200mg'$  of dietary cholesterol, 20-35 gm of total fiber daily;Understanding of distribution of calorie intake  throughout the day with the consumption of 4-5 meals/snacks    Hypertension Yes    Intervention Provide education on lifestyle modifcations including regular physical activity/exercise, weight management, moderate sodium restriction and increased consumption of fresh fruit, vegetables, and low fat dairy, alcohol moderation, and smoking cessation.;Monitor prescription use compliance.    Expected Outcomes Short Term: Continued assessment and intervention until BP is < 140/67m HG in hypertensive participants. < 130/851mHG in hypertensive participants with diabetes, heart failure or chronic kidney disease.;Long Term: Maintenance of blood pressure at goal levels.    Lipids Yes    Intervention Provide education and support for participant on nutrition & aerobic/resistive exercise along with prescribed medications to achieve LDL '70mg'$ , HDL >'40mg'$ .    Expected Outcomes Short Term: Participant states understanding of desired cholesterol values and is compliant with medications prescribed. Participant is following exercise prescription and nutrition guidelines.;Long Term: Cholesterol controlled with medications as prescribed, with individualized exercise RX and with personalized nutrition plan. Value goals: LDL < '70mg'$ , HDL > 40 mg.    Stress Yes    Intervention  Offer individual and/or small group education and counseling on adjustment to heart disease, stress management and health-related lifestyle change. Teach and support self-help strategies.;Refer participants experiencing significant psychosocial distress to appropriate mental health specialists for further evaluation and treatment. When possible, include family members and significant others in education/counseling sessions.    Expected Outcomes Long Term: Emotional wellbeing is indicated by absence of clinically significant psychosocial distress or social isolation.;Short Term: Participant demonstrates changes in health-related behavior, relaxation and other  stress management skills, ability to obtain effective social support, and compliance with psychotropic medications if prescribed.             Core Components/Risk Factors/Patient Goals Review:   Goals and Risk Factor Review     Row Name 10/28/22 1201 11/05/22 1136           Core Components/Risk Factors/Patient Goals Review   Personal Goals Review Weight Management/Obesity;Hypertension;Lipids;Stress Weight Management/Obesity;Hypertension;Lipids;Stress      Review Chaela started intensive cardiac rehab on 10/14/22. Shirline is off to a good start to exercise. Vital signs have been stable. Deeann started intensive cardiac rehab on 10/14/22. Kasmira is off to a good start to exercise. Vital signs have been stable.      Expected Outcomes Dolce will continue to participate in intensive cardiac rehab for exercise, nutrition and lifestyle modifications Sukayna will continue to participate in intensive cardiac rehab for exercise, nutrition and lifestyle modifications               Core Components/Risk Factors/Patient Goals at Discharge (Final Review):   Goals and Risk Factor Review - 11/05/22 1136       Core Components/Risk Factors/Patient Goals Review   Personal Goals Review Weight Management/Obesity;Hypertension;Lipids;Stress    Review Kenna started intensive cardiac rehab on 10/14/22. Brooklee is off to a good start to exercise. Vital signs have been stable.    Expected Outcomes Makalie will continue to participate in intensive cardiac rehab for exercise, nutrition and lifestyle modifications             ITP Comments:  ITP Comments     Row Name 10/09/22 1056 10/28/22 0853 11/05/22 1135       ITP Comments Dr. Fransico Him medical director. Introduction to [place] education program/ intensive cardiac rehab. Initial orientation packet reviewed with patient. 30 Day ITP Review. Dossie started intensive cardiac rehab on 10/14/22 and did well with exercise. 30 Day ITP Review. Demie started  intensive cardiac rehab on 10/14/22 and is off to a good start to exercise.              Comments: See ITP comments.Harrell Gave RN BSN

## 2022-11-06 ENCOUNTER — Encounter (HOSPITAL_COMMUNITY): Payer: Medicaid Other

## 2022-11-08 ENCOUNTER — Encounter (HOSPITAL_COMMUNITY): Payer: Medicaid Other

## 2022-11-11 ENCOUNTER — Encounter (HOSPITAL_COMMUNITY): Payer: Medicaid Other

## 2022-11-13 ENCOUNTER — Encounter (HOSPITAL_COMMUNITY): Payer: Medicaid Other

## 2022-11-15 ENCOUNTER — Encounter (HOSPITAL_COMMUNITY): Payer: Medicaid Other

## 2022-11-15 ENCOUNTER — Telehealth (HOSPITAL_COMMUNITY): Payer: Self-pay | Admitting: *Deleted

## 2022-11-15 NOTE — Telephone Encounter (Signed)
Left message to call cardiac rehab.Morton Simson Walden Grady Lucci RN BSN  

## 2022-11-18 ENCOUNTER — Encounter (HOSPITAL_COMMUNITY): Payer: Medicaid Other

## 2022-11-20 ENCOUNTER — Encounter: Payer: Self-pay | Admitting: Cardiology

## 2022-11-20 ENCOUNTER — Encounter (HOSPITAL_COMMUNITY): Payer: Medicaid Other

## 2022-11-20 ENCOUNTER — Telehealth (HOSPITAL_COMMUNITY): Payer: Self-pay | Admitting: *Deleted

## 2022-11-20 ENCOUNTER — Ambulatory Visit: Payer: Medicaid Other | Admitting: Cardiology

## 2022-11-20 VITALS — BP 135/80 | HR 60 | Ht 66.25 in | Wt 214.2 lb

## 2022-11-20 DIAGNOSIS — E782 Mixed hyperlipidemia: Secondary | ICD-10-CM

## 2022-11-20 DIAGNOSIS — I2510 Atherosclerotic heart disease of native coronary artery without angina pectoris: Secondary | ICD-10-CM

## 2022-11-20 DIAGNOSIS — I10 Essential (primary) hypertension: Secondary | ICD-10-CM

## 2022-11-20 MED ORDER — LOSARTAN POTASSIUM 50 MG PO TABS: 50.0000 mg | ORAL_TABLET | Freq: Every day | ORAL | 2 refills | Status: DC

## 2022-11-20 NOTE — Progress Notes (Signed)
Follow up visit  Subjective:   Julie Turner, female    DOB: 30-Mar-1956, 67 y.o.   MRN: [id number]     HPI  Chief Complaint  Patient presents with   Coronary Artery Disease   Follow-up   Chest Pain   Rash   67 y/o Caucasian female with hypertension, hyperlipidemia, CAD s/p CABG x3 (LIMA-LAD, SVG-ramus and RCA), PAD, major depression.  Patient has had no exertional chest pain. She feels fatigued. She also had "breakout rash" on her forehead, now improving. Blood pressure has been elevated, so she increased metoprolol succinate from 100 mg to 150 mg daily.   Reviewed recent test results with the patient, details below.     Current Outpatient Medications:    ALPRAZolam (XANAX) 1 MG tablet, Take 1 mg by mouth as needed for sleep. , Disp: , Rfl:    aspirin EC 81 MG tablet, Take 81 mg by mouth daily., Disp: , Rfl:    clopidogrel (PLAVIX) 75 MG tablet, Take 75 mg by mouth daily., Disp: , Rfl:    colchicine 0.6 MG tablet, Take 1 tablet (0.6 mg total) by mouth daily., Disp: 30 tablet, Rfl: 3   DULoxetine (CYMBALTA) 60 MG capsule, Take 60 mg by mouth daily., Disp: , Rfl:    Evolocumab (REPATHA SURECLICK) [id number] MG/ML SOAJ, Inject 140 mg into the skin every 14 (fourteen) days., Disp: 6 mL, Rfl: 5   ezetimibe (ZETIA) 10 MG tablet, Take 10 mg by mouth daily., Disp: , Rfl:    fluticasone (FLONASE) 50 MCG/ACT nasal spray, Place 2 sprays into both nostrils daily., Disp: , Rfl:    gabapentin (NEURONTIN) 600 MG tablet, Take 600 mg by mouth daily., Disp: , Rfl:    hydrochlorothiazide (HYDRODIURIL) 25 MG tablet, Take 25 mg by mouth daily., Disp: , Rfl:    lisinopril (ZESTRIL) 40 MG tablet, Take 40 mg by mouth daily., Disp: , Rfl:    loratadine (CLARITIN) 10 MG tablet, Take 10 mg by mouth as needed for allergies., Disp: , Rfl:    metoprolol succinate (TOPROL-XL) 100 MG 24 hr tablet, Take 100 mg by mouth daily., Disp: , Rfl:    Cardiovascular & other pertient studies:  Reviewed external labs and  tests, independently interpreted  EKG 11/20/2022: Sinus rhythm 73 bm  Possible anteroseptal infarct  Echocardiogram 06/30/2022:  EF, moderate concentric LVH no wall motion abnormality.  Heart catheterization 06/2022:  90% distal left main, 60% proximal RCA, 25% mid RCA.  Recent labs: 10/23/2022: Glucose 177, BUN/Cr 23/0.9. EGFR 64. K 4.1 Hb 15.1 HbA1C 6.5% Chol 140, TG 362, HDL 44, LDL 42  08/05/2022: Glucose 92, BUN/Cr 14/0.94. EGFR 68. Na/K 143/5.1.  H/H 15/49. MCV 94. Platelets 420 Direct LDL 155  07/01/2023: Chol 185, TG 417, HDL 30, LDL could not be calculated    Review of Systems  Cardiovascular:  Negative for chest pain, dyspnea on exertion, leg swelling, palpitations and syncope.         Vitals:   11/20/22 1258  BP: (!) 150/83  Pulse: 78  SpO2: 97%    Body mass index is 34.31 kg/m. Filed Weights   11/20/22 1258  Weight: 214 lb 3.2 oz (97.2 kg)     Objective:   Physical Exam Vitals and nursing note reviewed.  Constitutional:      General: She is not in acute distress. Neck:     Vascular: No JVD.  Cardiovascular:     Rate and Rhythm: Normal rate and regular rhythm.  Pulses:          Femoral pulses are 2+ on the right side and 2+ on the left side.      Popliteal pulses are 1+ on the right side and 1+ on the left side.       Dorsalis pedis pulses are 0 on the right side and 0 on the left side.       Posterior tibial pulses are 0 on the right side and 0 on the left side.     Heart sounds: Normal heart sounds. No murmur heard. Pulmonary:     Effort: Pulmonary effort is normal.     Breath sounds: Normal breath sounds. No wheezing or rales.  Musculoskeletal:     Right lower leg: No edema.     Left lower leg: No edema.             Visit diagnoses:   ICD-10-CM   1. Coronary artery disease involving native coronary artery of native heart without angina pectoris  I25.10 EKG [id number]    Basic metabolic panel    Direct LDL    2.  Essential hypertension  I10 losartan (COZAAR) 50 MG tablet    3. Mixed hyperlipidemia  E78.2        Orders Placed This Encounter  Procedures   Basic metabolic panel   Direct LDL   EKG 12-Lead     Medication changes this visit:  Meds ordered this encounter  Medications   losartan (COZAAR) 50 MG tablet    Sig: Take 1 tablet (50 mg total) by mouth daily.    Dispense:  30 tablet    Refill:  2     Assessment & Recommendations:   67 y/o Caucasian female with hypertension, hyperlipidemia, CAD s/p CABG x3 (LIMA-LAD, SVG-ramus and RCA), PAD, major depression.  CAD: No anginal symptoms at this time since CABG (06/2022). Continue aspirin, plavix. Given fatigue, reduce metoprolol succinate to 50 mg daily. She may take additional 50 mg at night if SBP >150 mmHg. Added losartan 50 mg daily. Check BMP and direct LDL in 1 week.  Okay to discontinue colchicine. Okay to hold rehab for next 4 weeks until follow up.  PAD: No claudication symptoms at this time, no CLI. Continue aggressive medical management as above.  Hypertension: As above  Mixed hyperlipidemia: Continue Repatha. Check direct LDL in 1 week.  F/u in 4 weeks    Nigel Mormon, MD Pager: [phone] Office: [phone]

## 2022-11-20 NOTE — Telephone Encounter (Signed)
Raniesha came by to let us know that she will not be returning to exercise to cardiac rehab due to fatigue and possible medication reaction. Will discharge from cardiac rehab at this time.Harrell Gave RN BSN

## 2022-11-21 NOTE — Progress Notes (Signed)
Discharge Progress Report  Patient Details  Name: Weena Eilertson MRN: [id number] Date of Birth: 03-11-1956 Referring Provider:   Flowsheet Row CARDIAC REHAB PHASE II ORIENTATION from 10/09/2022 in [facility]  Referring Provider Vernell Leep, MD        Number of Visits: 14  Reason for Discharge:  Early Exit:  medical reasons  Smoking History:  Social History   Tobacco Use  Smoking Status Former   Packs/day: 0.50   Years: 36.00   Additional pack years: 0.00   Total pack years: 18.00   Types: Cigarettes   Quit date: 06/28/2022   Years since quitting: 0.4  Smokeless Tobacco Never    Diagnosis:  NSTEMI (non-ST elevated myocardial infarction) ([place])  S/P CABG x 3  ADL UCSD:   Initial Exercise Prescription:  Initial Exercise Prescription - 10/09/22 1400       Date of Initial Exercise RX and Referring Provider   Date 10/09/22    Referring Provider Vernell Leep, MD    Expected Discharge Date 12/06/22      Treadmill   MPH 2    Grade 0    Minutes 15    METs 3      Rower   Level 1    Minutes 15    METs 2      Prescription Details   Frequency (times per week) 3    Duration Progress to 30 minutes of continuous aerobic without signs/symptoms of physical distress      Intensity   THRR 40-80% of Max Heartrate 62-125    Ratings of Perceived Exertion 11-13    Perceived Dyspnea 0-4      Progression   Progression Continue to progress workloads to maintain intensity without signs/symptoms of physical distress.      Resistance Training   Training Prescription Yes    Weight 3    Reps 10-15             Discharge Exercise Prescription (Final Exercise Prescription Changes):  Exercise Prescription Changes - 10/30/22 1629       Response to Exercise   Blood Pressure (Admit) 136/84    Blood Pressure (Exercise) 128/82    Blood Pressure (Exit) 130/86    Heart Rate (Admit) 70 bpm    Heart Rate  (Exercise) 116 bpm    Heart Rate (Exit) 79 bpm    Rating of Perceived Exertion (Exercise) 12    Perceived Dyspnea (Exercise) 0    Symptoms none    Comments Reviewed MET's and goals    Duration Continue with 30 min of aerobic exercise without signs/symptoms of physical distress.    Intensity THRR unchanged      Progression   Progression Continue to progress workloads to maintain intensity without signs/symptoms of physical distress.    Average METs 3.1      Resistance Training   Training Prescription No      Interval Training   Interval Training No      Treadmill   Minutes --   Pt request different equipment, did not like treadmill     NuStep   Level 3    SPM 107    Minutes 15    METs 2.4      Recumbant Elliptical   Level 1    RPM 70    Watts 99    Minutes 15    METs 3.8  Functional Capacity:  6 Minute Walk     Row Name 10/09/22 1400         6 Minute Walk   Phase Initial     Distance 1320 feet     Walk Time 6 minutes     # of Rest Breaks 1  leg cramp, 4:15-4:26     MPH 2.5     METS 3.09     RPE 9     Perceived Dyspnea  0     VO2 Peak 10.83     Symptoms Yes (comment)     Comments R calf cramp 9/10 pain. took break, resolved with rest     Resting HR 73 bpm     Resting BP 132/74     Resting Oxygen Saturation  98 %     Exercise Oxygen Saturation  during 6 min walk 99 %     Max Ex. HR 104 bpm     Max Ex. BP 154/80     2 Minute Post BP 130/68              Psychological, QOL, Others - Outcomes: PHQ 2/9:    10/09/2022    1:59 PM  Depression screen PHQ 2/9  Decreased Interest 2  Down, Depressed, Hopeless 1  PHQ - 2 Score 3  Altered sleeping 2  Tired, decreased energy 2  Change in appetite 2  Feeling bad or failure about yourself  2  Trouble concentrating 1  Moving slowly or fidgety/restless 1  Suicidal thoughts 0  PHQ-9 Score 13  Difficult doing work/chores Somewhat difficult    Quality of Life:  Quality of Life - 10/09/22  1413       Quality of Life   Select Quality of Life      Quality of Life Scores   Health/Function Pre 15.57 %    Socioeconomic Pre 20.19 %    Psych/Spiritual Pre 13.79 %    Family Pre 24 %    GLOBAL Pre 17.28 %             Personal Goals: Goals established at orientation with interventions provided to work toward goal.  Personal Goals and Risk Factors at Admission - 10/09/22 1416       Core Components/Risk Factors/Patient Goals on Admission    Weight Management Yes;Obesity;Weight Loss    Intervention Obesity: Provide education and appropriate resources to help participant work on and attain dietary goals.;Weight Management/Obesity: Establish reasonable short term and long term weight goals.;Weight Management: Provide education and appropriate resources to help participant work on and attain dietary goals.;Weight Management: Develop a combined nutrition and exercise program designed to reach desired caloric intake, while maintaining appropriate intake of nutrient and fiber, sodium and fats, and appropriate energy expenditure required for the weight goal.    Goal Weight: Short Term 180 lb (81.6 kg)    Expected Outcomes Short Term: Continue to assess and modify interventions until short term weight is achieved;Long Term: Adherence to nutrition and physical activity/exercise program aimed toward attainment of established weight goal;Weight Loss: Understanding of general recommendations for a balanced deficit meal plan, which promotes 1-2 lb weight loss per week and includes a negative energy balance of [phone] kcal/d;Understanding recommendations for meals to include 15-35% energy as protein, 25-35% energy from fat, 35-60% energy from carbohydrates, less than '200mg'$  of dietary cholesterol, 20-35 gm of total fiber daily;Understanding of distribution of calorie intake throughout the day with the consumption of 4-5 meals/snacks    Hypertension Yes  Intervention Provide education on lifestyle  modifcations including regular physical activity/exercise, weight management, moderate sodium restriction and increased consumption of fresh fruit, vegetables, and low fat dairy, alcohol moderation, and smoking cessation.;Monitor prescription use compliance.    Expected Outcomes Short Term: Continued assessment and intervention until BP is < 140/58m HG in hypertensive participants. < 130/858mHG in hypertensive participants with diabetes, heart failure or chronic kidney disease.;Long Term: Maintenance of blood pressure at goal levels.    Lipids Yes    Intervention Provide education and support for participant on nutrition & aerobic/resistive exercise along with prescribed medications to achieve LDL '70mg'$ , HDL >'40mg'$ .    Expected Outcomes Short Term: Participant states understanding of desired cholesterol values and is compliant with medications prescribed. Participant is following exercise prescription and nutrition guidelines.;Long Term: Cholesterol controlled with medications as prescribed, with individualized exercise RX and with personalized nutrition plan. Value goals: LDL < '70mg'$ , HDL > 40 mg.    Stress Yes    Intervention Offer individual and/or small group education and counseling on adjustment to heart disease, stress management and health-related lifestyle change. Teach and support self-help strategies.;Refer participants experiencing significant psychosocial distress to appropriate mental health specialists for further evaluation and treatment. When possible, include family members and significant others in education/counseling sessions.    Expected Outcomes Long Term: Emotional wellbeing is indicated by absence of clinically significant psychosocial distress or social isolation.;Short Term: Participant demonstrates changes in health-related behavior, relaxation and other stress management skills, ability to obtain effective social support, and compliance with psychotropic medications if prescribed.               Personal Goals Discharge:  Goals and Risk Factor Review     Row Name 10/28/22 1201 11/05/22 1136           Core Components/Risk Factors/Patient Goals Review   Personal Goals Review Weight Management/Obesity;Hypertension;Lipids;Stress Weight Management/Obesity;Hypertension;Lipids;Stress      Review [facility]anna[facility]arted intensive cardiac rehab on 10/14/22. KaFrancesss off to a good start to exercise. Vital signs have been stable. KaArkietarted intensive cardiac rehab on 10/14/22. KaSolomiyas off to a good start to exercise. Vital signs have been stable.      Expected Outcomes KaConchaill continue to participate in intensive cardiac rehab for exercise, nutrition and lifestyle modifications KaLakeithaill continue to participate in intensive cardiac rehab for exercise, nutrition and lifestyle modifications               Exercise Goals and Review:  Exercise Goals     Row Name 10/09/22 1404             Exercise Goals   Increase Physical Activity Yes       Intervention Provide advice, education, support and counseling about physical activity/exercise needs.;Develop an individualized exercise prescription for aerobic and resistive training based on initial evaluation findings, risk stratification, comorbidities and participant's personal goals.       Expected Outcomes Short Term: Attend rehab on a regular basis to increase amount of physical activity.;Long Term: Exercising regularly at least 3-5 days a week.;Long Term: Add in home exercise to make exercise part of routine and to increase amount of physical activity.       Increase Strength and Stamina Yes       Intervention Provide advice, education, support and counseling about physical activity/exercise needs.;Develop an individualized exercise prescription for aerobic and resistive training based on initial evaluation findings, risk stratification, comorbidities and participant's personal goals.       Expected  Outcomes Short Term:  Increase workloads from initial exercise prescription for resistance, speed, and METs.;Long Term: Improve cardiorespiratory fitness, muscular endurance and strength as measured by increased METs and functional capacity (6MWT);Short Term: Perform resistance training exercises routinely during rehab and add in resistance training at home       Able to understand and use rate of perceived exertion (RPE) scale Yes       Intervention Provide education and explanation on how to use RPE scale       Expected Outcomes Short Term: Able to use RPE daily in rehab to express subjective intensity level;Long Term:  Able to use RPE to guide intensity level when exercising independently       Knowledge and understanding of Target Heart Rate Range (THRR) Yes       Intervention Provide education and explanation of THRR including how the numbers were predicted and where they are located for reference       Expected Outcomes Short Term: Able to state/look up THRR;Long Term: Able to use THRR to govern intensity when exercising independently;Short Term: Able to use daily as guideline for intensity in rehab       Understanding of Exercise Prescription Yes       Intervention Provide education, explanation, and written materials on patient's individual exercise prescription       Expected Outcomes Short Term: Able to explain program exercise prescription;Long Term: Able to explain home exercise prescription to exercise independently                Exercise Goals Re-Evaluation:  Exercise Goals Re-Evaluation     Row Name 10/14/22 1643 10/30/22 1632           Exercise Goal Re-Evaluation   Exercise Goals Review Increase Physical Activity;Understanding of Exercise Prescription;Increase Strength and Stamina;Knowledge and understanding of Target Heart Rate Range (THRR);Able to understand and use rate of perceived exertion (RPE) scale Increase Physical Activity;Understanding of Exercise Prescription;Increase Strength and  Stamina;Knowledge and understanding of Target Heart Rate Range (THRR);Able to understand and use rate of perceived exertion (RPE) scale      Comments Pt first day in [facility]2 program. Pt tolerated exercise well with an average MET level of 2.62. Pt is learning her ExRx, THRR, and RPE scale. Reviewed MET's and goals. Pt tolerated exercise well with an average MET level of 3.1. Attempted to review home exercise, but pt wanted to test out treadmill here before beginning at home on her own. Since the weather has been cold she wants to add in treadmill and outdoor walking for exercise (as weather permits). Pt feels good about her goals of gaining strength and stamina, tried the [place] today and she enjoyed the new equipment      Expected Outcomes Will continue to monitor and progress workloads as tolerated without s/sx. Will continue to monitor and progress workloads as tolerated without s/sx.               Nutrition & Weight - Outcomes:  Pre Biometrics - 10/09/22 1057       Pre Biometrics   Waist Circumference 43.5 inches    Hip Circumference 46 inches    Waist to Hip Ratio 0.95 %    Triceps Skinfold 44 mm    % Body Fat 47.1 %    Grip Strength 30 kg    Flexibility 13.5 in    Single Leg Stand 18.25 seconds              Nutrition:  Nutrition  Therapy & Goals - 11/15/22 1619       Nutrition Therapy   Diet Heart Healthy/Carbohydrate Consistent      Personal Nutrition Goals   Nutrition Goal Patient to identify strategies for reducing cardiovascular risk by attending the weekly [employer] education and nutrition series    Personal Goal #2 Patient to improve diet quality by using the plate method as a daily guide for meal planning to include lean protein/plant protein, fruits, vegetables, whole grains, nonfat dairy as part of well balanced diet    Personal Goal #3 Patient to identify food sources and limit daily intake of saturated fat, trans fat, sodium, and refined carbohydrates    Personal  Goal #4 Patient to reduce sodium intake to 1500mg  per day    Comments Riann has not attended intensive cardiac rehab since 2/21. She did following up with PCP related to A1c in the diabetic range; she has not started any pharmaceutical intervention at this time. Triglycerides remain elevated. She will benefit from participation in intensive cardiac rehab for nutrition, exercise, and lifestyle modification.      Intervention Plan   Intervention Prescribe, educate and counsel regarding individualized specific dietary modifications aiming towards targeted core components such as weight, hypertension, lipid management, diabetes, heart failure and other comorbidities.;Nutrition handout(s) given to patient.    Expected Outcomes Short Term Goal: Understand basic principles of dietary content, such as calories, fat, sodium, cholesterol and nutrients.;Long Term Goal: Adherence to prescribed nutrition plan.             Nutrition Discharge:   Education Questionnaire Score:  Knowledge Questionnaire Score - 10/09/22 1414       Knowledge Questionnaire Score   Pre Score 20/24            [place] attended 7 exercise and 7 education classes cardiac rehab between 10/09/22- 2/10/10/22. Pilar stopped participation due to fatigue and a possible medication reaction. Dr Virgina Jock asked Shelbylynn to hold off on exercising at cardiac rehab for now. Barney has been discharged from cardiac rehab. Sherricka may consider returning to the program to complete her sessions at a later date and time.Harrell Gave RN BSN

## 2022-11-22 ENCOUNTER — Encounter (HOSPITAL_COMMUNITY): Payer: Medicaid Other

## 2022-11-25 ENCOUNTER — Encounter (HOSPITAL_COMMUNITY): Payer: Medicaid Other

## 2022-11-27 ENCOUNTER — Encounter (HOSPITAL_COMMUNITY): Payer: Medicaid Other

## 2022-11-29 ENCOUNTER — Encounter (HOSPITAL_COMMUNITY): Payer: Medicaid Other

## 2022-12-02 ENCOUNTER — Encounter (HOSPITAL_COMMUNITY): Payer: Medicaid Other

## 2022-12-04 ENCOUNTER — Encounter (HOSPITAL_COMMUNITY): Payer: Medicaid Other

## 2022-12-06 ENCOUNTER — Encounter (HOSPITAL_COMMUNITY): Payer: Medicaid Other

## 2022-12-09 ENCOUNTER — Telehealth: Payer: Self-pay

## 2022-12-09 DIAGNOSIS — I10 Essential (primary) hypertension: Secondary | ICD-10-CM

## 2022-12-09 MED ORDER — METOPROLOL SUCCINATE ER 100 MG PO TB24: 100.0000 mg | ORAL_TABLET | Freq: Every day | ORAL | 0 refills | Status: DC

## 2022-12-09 MED ORDER — LOSARTAN POTASSIUM 50 MG PO TABS: 50.0000 mg | ORAL_TABLET | Freq: Every day | ORAL | 2 refills | Status: DC

## 2022-12-17 ENCOUNTER — Encounter: Payer: Self-pay | Admitting: Cardiology

## 2022-12-17 ENCOUNTER — Ambulatory Visit: Payer: Medicaid Other | Admitting: Cardiology

## 2022-12-17 VITALS — BP 129/78 | HR 79 | Resp 16 | Ht 66.0 in | Wt 214.0 lb

## 2022-12-17 DIAGNOSIS — E782 Mixed hyperlipidemia: Secondary | ICD-10-CM

## 2022-12-17 DIAGNOSIS — I10 Essential (primary) hypertension: Secondary | ICD-10-CM

## 2022-12-17 DIAGNOSIS — I2510 Atherosclerotic heart disease of native coronary artery without angina pectoris: Secondary | ICD-10-CM

## 2022-12-17 DIAGNOSIS — I739 Peripheral vascular disease, unspecified: Secondary | ICD-10-CM | POA: Insufficient documentation

## 2022-12-17 NOTE — Progress Notes (Signed)
Follow up visit  Subjective:   Julie Turner, female    DOB: 03/02/1956, 67 y.o.   MRN: [id number]     HPI  Chief Complaint  Patient presents with   Coronary Artery Disease   Hypertension   Follow-up    4-6 week   67 y/o Caucasian female with hypertension, hyperlipidemia, CAD s/p CABG x3 (LIMA-LAD, SVG-ramus and RCA), PAD, major depression.  Patient went back on metoprolol succinate 100 mg daily as blood pressure was reportedly uncontrolled. Per my review of her home readings on limited days. SBP has ranged from 100s to occasional low 150. She is overall unhappy with her weight, although she has just started Ozempic. Patient is also concerned about "breakout skin rash" on her fae from time to time. She also wonders if COVID vaccine did damage to her heart.     Current Outpatient Medications:    ALPRAZolam (XANAX) 1 MG tablet, Take 1 mg by mouth as needed for sleep. , Disp: , Rfl:    aspirin EC 81 MG tablet, Take 81 mg by mouth daily., Disp: , Rfl:    clopidogrel (PLAVIX) 75 MG tablet, Take 75 mg by mouth daily., Disp: , Rfl:    DULoxetine (CYMBALTA) 60 MG capsule, Take 60 mg by mouth daily., Disp: , Rfl:    Evolocumab (REPATHA SURECLICK) 140 MG/ML SOAJ, Inject 140 mg into the skin every 14 (fourteen) days., Disp: 6 mL, Rfl: 5   ezetimibe (ZETIA) 10 MG tablet, Take 10 mg by mouth daily. (Patient not taking: Reported on 11/20/2022), Disp: , Rfl:    fluticasone (FLONASE) 50 MCG/ACT nasal spray, Place 2 sprays into both nostrils daily. (Patient not taking: Reported on 11/20/2022), Disp: , Rfl:    gabapentin (NEURONTIN) 600 MG tablet, Take 600 mg by mouth daily., Disp: , Rfl:    hydrochlorothiazide (HYDRODIURIL) 25 MG tablet, Take 25 mg by mouth daily., Disp: , Rfl:    loratadine (CLARITIN) 10 MG tablet, Take 10 mg by mouth as needed for allergies. (Patient not taking: Reported on 11/20/2022), Disp: , Rfl:    losartan (COZAAR) 50 MG tablet, Take 1 tablet (50 mg total) by mouth daily.,  Disp: 30 tablet, Rfl: 2   metoprolol succinate (TOPROL-XL) 100 MG 24 hr tablet, Take 1 tablet (100 mg total) by mouth daily., Disp: 30 tablet, Rfl: 0   Semaglutide,0.25 or 0.5MG /DOS, 2 MG/3ML SOPN, inject 0.25mg  weekly for 4 weeks then increase to 0.5mg  weekly Subcutaneous weekly for 30 days, Disp: , Rfl:    Cardiovascular & other pertient studies:  Reviewed external labs and tests, independently interpreted  EKG 11/20/2022: Sinus rhythm 73 bm  Possible anteroseptal infarct  Echocardiogram 06/30/2022:  EF, moderate concentric LVH no wall motion abnormality.  Heart catheterization 06/2022:  90% distal left main, 60% proximal RCA, 25% mid RCA.  Recent labs: 10/23/2022: Glucose 177, BUN/Cr 23/0.9. EGFR 64. K 4.1 Hb 15.1 HbA1C 6.5% Chol 140, TG 362, HDL 44, LDL 42  08/05/2022: Glucose 92, BUN/Cr 14/0.94. EGFR 68. Na/K 143/5.1.  H/H 15/49. MCV 94. Platelets 420 Direct LDL 155  07/01/2023: Chol 295, TG 417, HDL 30, LDL could not be calculated    Review of Systems  Cardiovascular:  Negative for chest pain, dyspnea on exertion, leg swelling, palpitations and syncope.         Vitals:   12/17/22 1403  BP: 129/78  Pulse: 79  Resp: 16  SpO2: 97%     Body mass index is 34.54 kg/m. Filed Weights   12/17/22  1403  Weight: 214 lb (97.1 kg)     Objective:   Physical Exam Vitals and nursing note reviewed.  Constitutional:      General: She is not in acute distress. Neck:     Vascular: No JVD.  Cardiovascular:     Rate and Rhythm: Normal rate and regular rhythm.     Pulses:          Femoral pulses are 2+ on the right side and 2+ on the left side.      Popliteal pulses are 1+ on the right side and 1+ on the left side.       Dorsalis pedis pulses are 0 on the right side and 0 on the left side.       Posterior tibial pulses are 0 on the right side and 0 on the left side.     Heart sounds: Normal heart sounds. No murmur heard. Pulmonary:     Effort: Pulmonary effort  is normal.     Breath sounds: Normal breath sounds. No wheezing or rales.  Musculoskeletal:     Right lower leg: No edema.     Left lower leg: No edema.            Visit diagnoses:   ICD-10-CM   1. Coronary artery disease involving native coronary artery of native heart without angina pectoris  I25.10     2. Essential hypertension  I10     3. Mixed hyperlipidemia  E78.2     4. PAD (peripheral artery disease)  I73.9          Assessment & Recommendations:   67 y/o Caucasian female with hypertension, hyperlipidemia, CAD s/p CABG x3 (LIMA-LAD, SVG-ramus and RCA), PAD, major depression.  CAD: No anginal symptoms at this time since CABG (06/2022). Continue aspirin, plavix. Continue metoprolol succinate 100 mg daily, losartan 50 mg daily.  PAD: No claudication symptoms at this time, no CLI. Continue aggressive medical management as above.  Hypertension: While there are a few spikes, I do not think she needs additional antihypertensive agent at this time. Recommend regular home monitoring.  Mixed hyperlipidemia: Continue Repatha.  F/u in 3 months    Elder Negus, MD Pager: [phone] Office: [phone]

## 2022-12-18 ENCOUNTER — Ambulatory Visit: Payer: Medicaid Other | Admitting: Cardiology

## 2023-01-03 ENCOUNTER — Other Ambulatory Visit: Payer: Self-pay | Admitting: Cardiology

## 2023-01-27 ENCOUNTER — Other Ambulatory Visit: Payer: Self-pay | Admitting: Cardiology

## 2023-03-20 ENCOUNTER — Encounter: Payer: Self-pay | Admitting: Cardiology

## 2023-03-20 ENCOUNTER — Ambulatory Visit: Payer: Medicaid Other | Admitting: Cardiology

## 2023-03-20 VITALS — BP 105/73 | HR 91 | Resp 16 | Ht 66.0 in | Wt 204.0 lb

## 2023-03-20 DIAGNOSIS — E782 Mixed hyperlipidemia: Secondary | ICD-10-CM

## 2023-03-20 DIAGNOSIS — I739 Peripheral vascular disease, unspecified: Secondary | ICD-10-CM

## 2023-03-20 DIAGNOSIS — I2510 Atherosclerotic heart disease of native coronary artery without angina pectoris: Secondary | ICD-10-CM

## 2023-03-20 DIAGNOSIS — I10 Essential (primary) hypertension: Secondary | ICD-10-CM

## 2023-03-20 MED ORDER — LOSARTAN POTASSIUM 50 MG PO TABS
50.0000 mg | ORAL_TABLET | Freq: Every day | ORAL | 3 refills | Status: DC
Start: 2023-03-20 — End: 2023-12-01

## 2023-03-20 MED ORDER — HYDROCHLOROTHIAZIDE 25 MG PO TABS: 25.0000 mg | ORAL_TABLET | Freq: Every day | ORAL | 3 refills | Status: DC

## 2023-03-20 MED ORDER — METOPROLOL SUCCINATE ER 100 MG PO TB24: 100.0000 mg | ORAL_TABLET | Freq: Every day | ORAL | 3 refills | Status: DC

## 2023-03-20 NOTE — Progress Notes (Signed)
Follow up visit  Subjective:   Julie Turner, female    DOB: December 04, 1955, 67 y.o.   MRN: [id number]     HPI  Chief Complaint  Patient presents with   Coronary Artery Disease   Follow-up    3 month    67 y/o Caucasian female with hypertension, hyperlipidemia, CAD s/p CABG x3 (LIMA-LAD, SVG-ramus and RCA), PAD, major depression.  Patient is doing well. She has stopped taking Plavix and colchicine and feels better. She denies chest pain, shortness of breath, palpitations, leg edema, orthopnea, PND, TIA/syncope.    Current Outpatient Medications:    ALPRAZolam (XANAX) 1 MG tablet, Take 1 mg by mouth as needed for sleep. , Disp: , Rfl:    aspirin EC 81 MG tablet, Take 81 mg by mouth daily., Disp: , Rfl:    CEQUA 0.09 % SOLN, Apply 1 drop to eye 2 (two) times daily., Disp: , Rfl:    Cholecalciferol (VITAMIN D) 125 MCG (5000 UT) CAPS, Take by mouth., Disp: , Rfl:    clopidogrel (PLAVIX) 75 MG tablet, Take 75 mg by mouth daily., Disp: , Rfl:    Coenzyme Q10 50 MG CAPS, as directed Orally, Disp: , Rfl:    colchicine 0.6 MG tablet, Take 1 tablet by mouth once daily, Disp: 30 tablet, Rfl: 0   DULoxetine (CYMBALTA) 60 MG capsule, Take 60 mg by mouth daily., Disp: , Rfl:    Evolocumab (REPATHA SURECLICK) 140 MG/ML SOAJ, Inject 140 mg into the skin every 14 (fourteen) days., Disp: 6 mL, Rfl: 5   gabapentin (NEURONTIN) 600 MG tablet, Take 600 mg by mouth daily., Disp: , Rfl:    hydrochlorothiazide (HYDRODIURIL) 25 MG tablet, Take 25 mg by mouth daily., Disp: , Rfl:    losartan (COZAAR) 50 MG tablet, Take 1 tablet (50 mg total) by mouth daily., Disp: 30 tablet, Rfl: 2   magnesium gluconate (MAGONATE) 500 MG tablet, Take 500 mg by mouth 2 (two) times daily., Disp: , Rfl:    metoprolol succinate (TOPROL-XL) 100 MG 24 hr tablet, Take 1 tablet (100 mg total) by mouth daily., Disp: 30 tablet, Rfl: 0   Multiple Vitamins-Minerals (MULTIVITAMIN ADULTS 50+) TABS, 1 capsule Orally twice a day, Disp: ,  Rfl:    Semaglutide,0.25 or 0.5MG /DOS, 2 MG/3ML SOPN, inject 0.25mg  weekly for 4 weeks then increase to 0.5mg  weekly Subcutaneous weekly for 30 days, Disp: , Rfl:    tiZANidine (ZANAFLEX) 4 MG tablet, Take 4 mg by mouth 3 (three) times daily as needed., Disp: , Rfl:    Cardiovascular & other pertient studies:  Reviewed external labs and tests, independently interpreted  EKG 11/20/2022: Sinus rhythm 73 bm  Possible anteroseptal infarct  Echocardiogram 06/30/2022:  EF, moderate concentric LVH no wall motion abnormality.  Heart catheterization 06/2022:  90% distal left main, 60% proximal RCA, 25% mid RCA.  Recent labs: 10/23/2022: Glucose 177, BUN/Cr 23/0.9. EGFR 64. K 4.1 Hb 15.1 HbA1C 6.5% Chol 140, TG 362, HDL 44, LDL 42  08/05/2022: Glucose 92, BUN/Cr 14/0.94. EGFR 68. Na/K 143/5.1.  H/H 15/49. MCV 94. Platelets 420 Direct LDL 155  07/01/2023: Chol 409, TG 417, HDL 30, LDL could not be calculated    Review of Systems  Cardiovascular:  Negative for chest pain, dyspnea on exertion, leg swelling, palpitations and syncope.         Vitals:   03/20/23 1350  BP: 105/73  Pulse: 91  Resp: 16  SpO2: 96%      Body mass index is  32.93 kg/m. Filed Weights   03/20/23 1350  Weight: 204 lb (92.5 kg)      Objective:   Physical Exam Vitals and nursing note reviewed.  Constitutional:      General: She is not in acute distress. Neck:     Vascular: No JVD.  Cardiovascular:     Rate and Rhythm: Normal rate and regular rhythm.     Pulses:          Femoral pulses are 2+ on the right side and 2+ on the left side.      Popliteal pulses are 1+ on the right side and 1+ on the left side.       Dorsalis pedis pulses are 0 on the right side and 0 on the left side.       Posterior tibial pulses are 0 on the right side and 0 on the left side.     Heart sounds: Normal heart sounds. No murmur heard. Pulmonary:     Effort: Pulmonary effort is normal.     Breath sounds: Normal  breath sounds. No wheezing or rales.  Musculoskeletal:     Right lower leg: No edema.     Left lower leg: No edema.            Visit diagnoses:   ICD-10-CM   1. Coronary artery disease involving native coronary artery of native heart without angina pectoris  I25.10     2. Primary hypertension  I10 losartan (COZAAR) 50 MG tablet    hydrochlorothiazide (HYDRODIURIL) 25 MG tablet    3. Essential hypertension  I10     4. Mixed hyperlipidemia  E78.2     5. PAD (peripheral artery disease) (HCC)  I73.9           Assessment & Recommendations:   67 y/o Caucasian female with hypertension, hyperlipidemia, CAD s/p CABG x3 (LIMA-LAD, SVG-ramus and RCA), PAD, major depression.  CAD: No anginal symptoms at this time since CABG (06/2022). Continue aspirin. She has stopped plavix okay with me, . Continue metoprolol succinate 100 mg daily, losartan 50 mg daily.  PAD: No claudication symptoms at this time, no CLI. Continue aggressive medical management as above.  Hypertension: Controlled. I proposed stopping hydrochlorothiazide, but she wants to conitnue for now.  Mixed hyperlipidemia: Continue Repatha.  F/u in 6 months    Elder Negus, MD Pager: [phone] Office: [phone]

## 2023-05-27 DIAGNOSIS — L700 Acne vulgaris: Secondary | ICD-10-CM | POA: Diagnosis not present

## 2023-05-27 DIAGNOSIS — L732 Hidradenitis suppurativa: Secondary | ICD-10-CM | POA: Diagnosis not present

## 2023-05-27 DIAGNOSIS — L821 Other seborrheic keratosis: Secondary | ICD-10-CM | POA: Diagnosis not present

## 2023-05-27 DIAGNOSIS — D1801 Hemangioma of skin and subcutaneous tissue: Secondary | ICD-10-CM | POA: Diagnosis not present

## 2023-06-12 DIAGNOSIS — F411 Generalized anxiety disorder: Secondary | ICD-10-CM | POA: Diagnosis not present

## 2023-06-12 DIAGNOSIS — F330 Major depressive disorder, recurrent, mild: Secondary | ICD-10-CM | POA: Diagnosis not present

## 2023-06-24 DIAGNOSIS — F609 Personality disorder, unspecified: Secondary | ICD-10-CM | POA: Diagnosis not present

## 2023-06-24 DIAGNOSIS — F331 Major depressive disorder, recurrent, moderate: Secondary | ICD-10-CM | POA: Diagnosis not present

## 2023-06-24 DIAGNOSIS — F419 Anxiety disorder, unspecified: Secondary | ICD-10-CM | POA: Diagnosis not present

## 2023-06-24 DIAGNOSIS — G4700 Insomnia, unspecified: Secondary | ICD-10-CM | POA: Diagnosis not present

## 2023-07-24 ENCOUNTER — Telehealth: Payer: Self-pay

## 2023-07-24 ENCOUNTER — Other Ambulatory Visit (HOSPITAL_COMMUNITY): Payer: Self-pay

## 2023-07-24 NOTE — Telephone Encounter (Signed)
Pharmacy Patient Advocate Encounter   Received notification from CoverMyMeds that prior authorization for REPATHA is required/requested.   Insurance verification completed.   The patient is insured through [employer] .   Per test claim: PA required; PA submitted to above mentioned insurance via CoverMyMeds Key/confirmation #/EOC [id number] Status is pending

## 2023-07-24 NOTE — Telephone Encounter (Signed)
Pharmacy Patient Advocate Encounter  Received notification from [facility] that Prior Authorization for REPATHA has been APPROVED from 09/09/22 to 09/08/24. Ran test claim, Copay is $0. This test claim was processed through [facility]- copay amounts may vary at other pharmacies due to pharmacy/plan contracts, or as the patient moves through the different stages of their insurance plan.

## 2023-08-22 DIAGNOSIS — F419 Anxiety disorder, unspecified: Secondary | ICD-10-CM | POA: Diagnosis not present

## 2023-08-22 DIAGNOSIS — F331 Major depressive disorder, recurrent, moderate: Secondary | ICD-10-CM | POA: Diagnosis not present

## 2023-08-22 DIAGNOSIS — F609 Personality disorder, unspecified: Secondary | ICD-10-CM | POA: Diagnosis not present

## 2023-09-22 DIAGNOSIS — I10 Essential (primary) hypertension: Secondary | ICD-10-CM | POA: Diagnosis not present

## 2023-09-22 DIAGNOSIS — R634 Abnormal weight loss: Secondary | ICD-10-CM | POA: Diagnosis not present

## 2023-09-22 DIAGNOSIS — R030 Elevated blood-pressure reading, without diagnosis of hypertension: Secondary | ICD-10-CM | POA: Diagnosis not present

## 2023-10-01 ENCOUNTER — Ambulatory Visit: Payer: 59 | Admitting: Cardiology

## 2023-10-10 DIAGNOSIS — E119 Type 2 diabetes mellitus without complications: Secondary | ICD-10-CM | POA: Diagnosis not present

## 2023-10-10 DIAGNOSIS — E038 Other specified hypothyroidism: Secondary | ICD-10-CM | POA: Diagnosis not present

## 2023-10-10 DIAGNOSIS — E785 Hyperlipidemia, unspecified: Secondary | ICD-10-CM | POA: Diagnosis not present

## 2023-10-10 DIAGNOSIS — R790 Abnormal level of blood mineral: Secondary | ICD-10-CM | POA: Diagnosis not present

## 2023-10-10 DIAGNOSIS — E559 Vitamin D deficiency, unspecified: Secondary | ICD-10-CM | POA: Diagnosis not present

## 2023-10-10 DIAGNOSIS — Z951 Presence of aortocoronary bypass graft: Secondary | ICD-10-CM | POA: Diagnosis not present

## 2023-11-07 DIAGNOSIS — J302 Other seasonal allergic rhinitis: Secondary | ICD-10-CM | POA: Diagnosis not present

## 2023-11-07 DIAGNOSIS — R799 Abnormal finding of blood chemistry, unspecified: Secondary | ICD-10-CM | POA: Diagnosis not present

## 2023-11-11 DIAGNOSIS — M7061 Trochanteric bursitis, right hip: Secondary | ICD-10-CM | POA: Diagnosis not present

## 2023-11-18 ENCOUNTER — Encounter: Payer: Self-pay | Admitting: Rehabilitative and Restorative Service Providers"

## 2023-11-18 ENCOUNTER — Ambulatory Visit: Attending: Orthopedic Surgery | Admitting: Rehabilitative and Restorative Service Providers"

## 2023-11-18 ENCOUNTER — Other Ambulatory Visit: Payer: Self-pay

## 2023-11-18 DIAGNOSIS — M6281 Muscle weakness (generalized): Secondary | ICD-10-CM | POA: Diagnosis not present

## 2023-11-18 DIAGNOSIS — R262 Difficulty in walking, not elsewhere classified: Secondary | ICD-10-CM | POA: Diagnosis not present

## 2023-11-18 DIAGNOSIS — M25551 Pain in right hip: Secondary | ICD-10-CM | POA: Diagnosis not present

## 2023-11-18 DIAGNOSIS — R252 Cramp and spasm: Secondary | ICD-10-CM | POA: Insufficient documentation

## 2023-11-18 NOTE — Therapy (Signed)
 OUTPATIENT PHYSICAL THERAPY LOWER EXTREMITY EVALUATION   Patient Name: Julie Turner MRN: [id number] DOB:November 03, 1955, 68 y.o., female Today's Date: 11/18/2023  END OF SESSION:  PT End of Session - 11/18/23 0855     Visit Number 1    Date for PT Re-Evaluation 01/09/24    Authorization Type UHC Medicare Dual    Progress Note Due on Visit 10    PT Start Time 0845    PT Stop Time 0925    PT Time Calculation (min) 40 min    Activity Tolerance Patient tolerated treatment well    Behavior During Therapy [facility] for tasks assessed/performed             Past Medical History:  Diagnosis Date   Anxiety    Arthritis    "all my joints"   Breast cancer, right breast (HCC) 2001   S/P lumpecbomy and radiation   Depression    Fibromyalgia    GERD (gastroesophageal reflux disease)    High cholesterol    Hypertension    "just when I had radiation in 2001"   Migraine    "when I don't take my ASA qd" (08/15/2014)   Numbness and tingling in left hand    Spinal headache    Stroke (HCC) ~ 2013   "silent stroke" per her neurologist   Past Surgical History:  Procedure Laterality Date   BREAST LUMPECTOMY Right 09/10/1999   BREAST LUMPECTOMY Right    2nd time w/"all the lymph nodes under my arm"   BREAST RECONSTRUCTION Bilateral    CESAREAN SECTION  1987; 1988   CHOLECYSTECTOMY  09/10/1999   CORONARY ARTERY BYPASS GRAFT  06/2022   DILATION AND CURETTAGE OF UTERUS  09/10/1999   FOOT SURGERY Left ~ 2012   joint cleaned out   JOINT REPLACEMENT     TOTAL HIP ARTHROPLASTY Right 08/15/2014   TOTAL HIP ARTHROPLASTY Right 08/15/2014   Procedure: RIGHT TOTAL HIP ARTHROPLASTY;  Surgeon: Nestor Lewandowsky, MD;  Location: [facility] OR;  Service: Orthopedics;  Laterality: Right;   Patient Active Problem List   Diagnosis Date Noted   PAD (peripheral artery disease) (HCC) 12/17/2022   Coronary artery disease involving native coronary artery of native heart without angina pectoris 08/09/2022   Statin myopathy  08/09/2022   Mixed hyperlipidemia 08/09/2022   S/P CABG (coronary artery bypass graft) 08/09/2022   Essential hypertension 08/09/2022   Low back pain 11/22/2021   Leg cramps 01/18/2019   Decreased exercise tolerance 01/18/2019   Arthritis of right hip 08/15/2014    PCP: Jackelyn Poling, DO  REFERRING PROVIDER: Gean Birchwood, MD  REFERRING DIAG: M70.61 (ICD-10-CM) - Trochanteric bursitis, right hip  THERAPY DIAG:  Muscle weakness (generalized)  Cramp and spasm  Difficulty in walking, not elsewhere classified  Pain in right hip  Rationale for Evaluation and Treatment: Rehabilitation  ONSET DATE: "a couple of weeks, maybe 3"  SUBJECTIVE:   SUBJECTIVE STATEMENT: Patient states that she does a lot of hiking and her last hike was on Saturday for 3-4 miles.  Patient states that she cannot tell a difference when she hikes or not, she has pain either way.  Patient states that she has been having trouble sleeping.  PERTINENT HISTORY: Carnivore Diet Right THA in 2015, Hx of breast cancer, Right great toe pain, OA  PAIN:  Are you having pain? Yes: NPRS scale: 8-9/10 Pain location: right hip Pain description: "a muscle spasm"  "I feel like I have a Charley Horse in my hip that won't  leg go" Aggravating factors: unknown Relieving factors: unknown  PRECAUTIONS: None  RED FLAGS: None   WEIGHT BEARING RESTRICTIONS: No  FALLS:  Has patient fallen in last 6 months? 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  PATIENT GOALS: To be pain free and be more active to hike and exercise.  NEXT MD VISIT: As needed  OBJECTIVE:  Note: Objective measures were completed at Evaluation unless otherwise noted.  DIAGNOSTIC FINDINGS:  Pt reports that they did radiographs at the  ortho office and patient was diagnosed with bursitis.  PATIENT SURVEYS:  LEFS 10 / 80 = 12.5 %  COGNITION: Overall cognitive status: Within functional limits for tasks assessed     SENSATION: Patient reports some numbness and tingling down her right leg   MUSCLE LENGTH: Hamstrings: Tightness bilateral  POSTURE: rounded shoulders and forward head  PALPATION: Patient is tender to palpation along right hip  LOWER EXTREMITY ROM:  WFL with some pain on right hip and right great toe  LOWER EXTREMITY MMT:  Eval: Left LE is WFL Right hip strength is grossly 4/5   FUNCTIONAL TESTS:  Eval: 5 times sit to stand: 13.74 sec Timed up and go (TUG): 9.65 sec  GAIT: Distance walked: >500 ft Assistive device utilized: None Level of assistance: Complete Independence Comments: Patient reports antalgic gait                                                                                                                                TODAY'S TREATMENT  DATE: 11/18/2023  Review of HEP and aquatic therapy Manual Therapy:  instrument assisted manual therapy with Addaday secondary to reports of increased pain and feelings of cramping to promote tissue elongation.   PATIENT EDUCATION:  Education details: Issued HEP and provided handouts for aquatic PT Person educated: Patient Education method: Explanation, Demonstration, and Handouts Education comprehension: verbalized understanding  HOME EXERCISE PROGRAM: Access Code: [email or web address] URL: [email or web address] Date: 11/18/2023 Prepared by: Clydie Braun Esteban Kobashigawa  Exercises - Seated Hamstring Stretch  - 1 x daily - 7 x weekly - 2 reps - 20 sec hold - Seated Long Arc Quad  - 1 x daily - 7 x weekly - 2 sets - 10 reps - Small Range Straight Leg Raise  - 1 x daily - 7 x weekly - 2 sets - 10 reps - Clamshell  - 1 x daily - 7 x weekly - 2 sets - 10 reps  ASSESSMENT:  CLINICAL IMPRESSION: Patient is a 68 y.o. female who was seen  today for physical therapy evaluation and treatment for right hip trochanteric bursitis.  Patient reports that she started having right hip pain 2-3 weeks ago.  States that she has not noted any difference in her pain when she  hikes or does not.  Reports that ortho believes that she has bursitis and imaging did not reveal any changes with her right THA hardware.  Patient presents with increased pain, muscle spasms, muscle weakness, difficulty walking, and difficulty performing various tasks, including sleeping.  Following manual therapy with addaday, patient did report some decreased pain. Patient would benefit from skilled PT to allow her to return to her active lifestyle without increased pain.   OBJECTIVE IMPAIRMENTS: decreased balance, difficulty walking, decreased strength, increased muscle spasms, impaired flexibility, postural dysfunction, and pain.   ACTIVITY LIMITATIONS: lifting, bending, sitting, standing, squatting, sleeping, and stairs  PARTICIPATION LIMITATIONS: cleaning, laundry, community activity, and yard work  PERSONAL FACTORS: Time since onset of injury/illness/exacerbation and 3+ comorbidities: OA, Hx of right THA, Hx of breast cancer  are also affecting patient's functional outcome.   REHAB POTENTIAL: Good  CLINICAL DECISION MAKING: Evolving/moderate complexity  EVALUATION COMPLEXITY: Moderate   GOALS: Goals reviewed with patient? Yes  SHORT TERM GOALS: Target date: 12/12/2023 Patient will be independent with initial HEP. Baseline: Goal status: INITIAL  2.  Patient will report at least a 25% improvement in right hip pain. Baseline:  Goal status: INITIAL   LONG TERM GOALS: Target date: 01/09/2024  Patient will be independent with advanced HEP to allow for self progression after discharge. Baseline:  Goal status: INITIAL  2.  Patient will improve Lower Extremity Functional Scale (LEFS) to at least 50% to demonstrate improvements in functional tasks. Baseline:  12.5% Goal status: INITIAL  3.  Patient will increase right hip pain to at least 4+ to 5-/5 to allow her to hike on various terrain and navigate stairs with improved ease. Baseline: 4/5 Goal status: INITIAL  4.  Patient will report ability to return to hiking for at least 2 miles without increased pain. Baseline:  Goal status: INITIAL   PLAN:  PT FREQUENCY: 2x/week  PT DURATION: 8 weeks  PLANNED INTERVENTIONS: 97164- PT Re-evaluation, 97110-Therapeutic exercises, 97530- Therapeutic activity, 97112- Neuromuscular re-education, 97535- Self Care, 16109- Manual therapy, [phone]- Gait training, [phone]- Canalith repositioning, [id number]- Aquatic Therapy, [phone]- Electrical stimulation (unattended), [phone]- Electrical stimulation (manual), [id number]- Vasopneumatic device, [id number]- Ultrasound, [id number]- Traction (mechanical), [id number]- Ionotophoresis 4mg /ml Dexamethasone, Patient/Family education, Balance training, Stair training, Taping, Dry Needling, Joint mobilization, Joint manipulation, Spinal manipulation, Spinal mobilization, Vestibular training, Cryotherapy, and Moist heat  PLAN FOR NEXT SESSION: Assess and progress HEP as indicated, strengthening, flexibility, manual/dry needling as indicated    Reather Laurence, PT, DPT 11/18/23, 8:57 AM  [facility] [address], [address] [place], [place] [address] Phone # [phone] Fax [phone]

## 2023-11-18 NOTE — Patient Instructions (Signed)
   [facility] Welcome to [facility]! Here you will find all the information you will need regarding your pool therapy. If you have further questions at any time, please call our office at [phone]. After completing your initial evaluation in the [facility], you may be eligible to complete a portion of your therapy in the pool. A typical week of therapy will consist of 1-2 typical physical therapy visits at our [facility] location and an additional session of therapy in the pool located at the [facility] at [facility]. [address] [place] [address]. The phone number at the pool site is [phone]. Please call this number if you are running late or need to cancel your appointment.  Aquatic therapy will be offered on Wednesday mornings and Friday afternoons. Each session will last approximately 45 minutes. All scheduling and payments for aquatic therapy sessions, including cancelations, will be done through our [facility] location.  To be eligible for aquatic therapy, these criteria must be met: You must be able to independently change in the locker room and get to the pool deck. A caregiver can come with you to help if needed. There are benches for a caregiver to sit on next to the pool. No one with an open wound is permitted in the pool.  Handicap parking is available in the front and there is a drop off option for even closer accessibility. Please arrive 15 minutes prior to your appointment to prepare for your pool session. You must sign in at the front desk upon your arrival. Please be sure to attend to any toileting needs prior to entering the pool. Locker rooms for changing are available.  There is direct access to the pool deck from the locker room. You can lock your belongings in a locker or bring them with you poolside. Your therapist will greet you on the pool deck. There may be other swimmers in the pool at the  same time but your session is one-on-one with the therapist.

## 2023-11-25 DIAGNOSIS — M25532 Pain in left wrist: Secondary | ICD-10-CM | POA: Diagnosis not present

## 2023-11-25 DIAGNOSIS — M542 Cervicalgia: Secondary | ICD-10-CM | POA: Diagnosis not present

## 2023-11-26 ENCOUNTER — Ambulatory Visit

## 2023-11-27 DIAGNOSIS — H0288A Meibomian gland dysfunction right eye, upper and lower eyelids: Secondary | ICD-10-CM | POA: Diagnosis not present

## 2023-11-27 DIAGNOSIS — H0288B Meibomian gland dysfunction left eye, upper and lower eyelids: Secondary | ICD-10-CM | POA: Diagnosis not present

## 2023-11-28 DIAGNOSIS — M79671 Pain in right foot: Secondary | ICD-10-CM | POA: Diagnosis not present

## 2023-11-28 DIAGNOSIS — M2021 Hallux rigidus, right foot: Secondary | ICD-10-CM | POA: Diagnosis not present

## 2023-11-28 DIAGNOSIS — M2022 Hallux rigidus, left foot: Secondary | ICD-10-CM | POA: Diagnosis not present

## 2023-12-01 ENCOUNTER — Encounter: Payer: Self-pay | Admitting: Cardiology

## 2023-12-01 ENCOUNTER — Ambulatory Visit

## 2023-12-01 ENCOUNTER — Ambulatory Visit: Payer: 59 | Attending: Cardiology | Admitting: Cardiology

## 2023-12-01 VITALS — BP 118/60 | HR 79 | Ht 66.0 in | Wt 184.2 lb

## 2023-12-01 DIAGNOSIS — I2510 Atherosclerotic heart disease of native coronary artery without angina pectoris: Secondary | ICD-10-CM

## 2023-12-01 DIAGNOSIS — G720 Drug-induced myopathy: Secondary | ICD-10-CM

## 2023-12-01 DIAGNOSIS — E782 Mixed hyperlipidemia: Secondary | ICD-10-CM

## 2023-12-01 DIAGNOSIS — R002 Palpitations: Secondary | ICD-10-CM

## 2023-12-01 DIAGNOSIS — I10 Essential (primary) hypertension: Secondary | ICD-10-CM

## 2023-12-01 DIAGNOSIS — I739 Peripheral vascular disease, unspecified: Secondary | ICD-10-CM

## 2023-12-01 DIAGNOSIS — T466X5D Adverse effect of antihyperlipidemic and antiarteriosclerotic drugs, subsequent encounter: Secondary | ICD-10-CM

## 2023-12-01 MED ORDER — REPATHA SURECLICK 140 MG/ML ~~LOC~~ SOAJ: 140.0000 mg | SUBCUTANEOUS | 5 refills | Status: DC

## 2023-12-01 MED ORDER — ASPIRIN 81 MG PO TBEC: 81.0000 mg | DELAYED_RELEASE_TABLET | Freq: Every day | ORAL | 3 refills | Status: AC

## 2023-12-01 MED ORDER — METOPROLOL SUCCINATE ER 100 MG PO TB24: 100.0000 mg | ORAL_TABLET | Freq: Every day | ORAL | 3 refills | Status: AC

## 2023-12-01 MED ORDER — HYDROCHLOROTHIAZIDE 25 MG PO TABS: 25.0000 mg | ORAL_TABLET | Freq: Every day | ORAL | 3 refills | Status: AC

## 2023-12-01 MED ORDER — LOSARTAN POTASSIUM 50 MG PO TABS: 50.0000 mg | ORAL_TABLET | Freq: Every day | ORAL | 3 refills | Status: AC

## 2023-12-01 NOTE — Patient Instructions (Addendum)
 Medication Instructions:  Refilled prescriptions today   RESTART Repatha and follow up with pharm D *If you need a refill on your cardiac medications before your next appointment, please call your pharmacy*   Lab Work: Lipid panel in 3 months   If you have labs (blood work) drawn today and your tests are completely normal, you will receive your results only by: MyChart Message (if you have MyChart) OR A paper copy in the mail If you have any lab test that is abnormal or we need to change your treatment, we will call you to review the results.   Testing/Procedures: 2 week zio patch   Your physician has requested that you wear a Zio heart monitor for __14___ days. This will be mailed to your home with instructions on how to apply the monitor and how to return it when finished. Please allow 2 weeks after returning the heart monitor before our office calls you with the results.   ZIO XT- Long Term Monitor Instructions     Your physician has requested you wear a ZIO patch monitor for 3 days.  This is a single patch monitor. [employer] supplies one patch monitor per enrollment. Additional  stickers are not available. Please do not apply patch if you will be having a Nuclear Stress Test,  Echocardiogram, Cardiac CT, MRI, or Chest Xray during the period you would be wearing the  monitor. The patch cannot be worn during these tests. You cannot remove and re-apply the  ZIO XT patch monitor.  Your ZIO patch monitor will be mailed 3 day USPS to your address on file. It may take 3-5 days  to receive your monitor after you have been enrolled.  Once you have received your monitor, please review the enclosed instructions. Your monitor  has already been registered assigning a specific monitor serial # to you.     Billing and Patient Assistance Program Information     We have supplied [employer] with any of your insurance information on file for billing purposes.  [employer] offers a sliding scale Patient  Assistance Program for patients that do not have  insurance, or whose insurance does not completely cover the cost of the ZIO monitor.  You must apply for the Patient Assistance Program to qualify for this discounted rate.  To apply, please call [employer] at [phone], select option 4, select option 2, ask to apply for  Patient Assistance Program. Meredeth Ide will ask your household income, and how many people  are in your household. They will quote your out-of-pocket cost based on that information.  [employer] will also be able to set up a 78-month, interest-free payment plan if needed.     Applying the monitor     Shave hair from upper left chest.  Hold abrader disc by orange tab. Rub abrader in 40 strokes over the upper left chest as  indicated in your monitor instructions.  Clean area with 4 enclosed alcohol pads. Let dry.  Apply patch as indicated in monitor instructions. Patch will be placed under collarbone on left  side of chest with arrow pointing upward.  Rub patch adhesive wings for 2 minutes. Remove white label marked "1". Remove the white  label marked "2". Rub patch adhesive wings for 2 additional minutes.  While looking in a mirror, press and release button in center of patch. A small green light will  flash 3-4 times. This will be your only indicator that the monitor has been turned on.  Do not shower for  the first 24 hours. You may shower after the first 24 hours.  Press the button if you feel a symptom. You will hear a small click. Record Date, Time and  Symptom in the Patient Logbook.  When you are ready to remove the patch, follow instructions on the last 2 pages of Patient  Logbook. Stick patch monitor onto the last page of Patient Logbook.  Place Patient Logbook in the blue and white box. Use locking tab on box and tape box closed  securely. The blue and white box has prepaid postage on it. Please place it in the mailbox as  soon as possible. Your physician should have your  test results approximately 7 days after the  monitor has been mailed back to [facility].  Call [facility] Customer Care at [phone] if you have questions regarding  your ZIO XT patch monitor. Call them immediately if you see an orange light blinking on your  monitor.  If your monitor falls off in less than 4 days, contact our Monitor department at [phone].  If your monitor becomes loose or falls off after 4 days call [employer] at [phone] for  suggestions on securing your monitor.     Follow-Up: At [facility], you and your health needs are our priority.  As part of our continuing mission to provide you with exceptional heart care, we have created designated Provider Care Teams.  These Care Teams include your primary Cardiologist (physician) and Advanced Practice Providers (APPs -  Physician Assistants and Nurse Practitioners) who all work together to provide you with the care you need, when you need it.  We recommend signing up for the patient portal called "MyChart".  Sign up information is provided on this After Visit Summary.  MyChart is used to connect with patients for Virtual Visits (Telemedicine).  Patients are able to view lab/test results, encounter notes, upcoming appointments, etc.  Non-urgent messages can be sent to your provider as well.   To learn more about what you can do with MyChart, go to [email or web address].    Your next appointment:   6 month(s)  Provider:   Dr. Rosemary Holms    Other Instructions Diet & Lifestyle recommendations:  Physical activity recommendation (The Physical Activity Guidelines for Americans. JAMA 2018;Nov 12) At least 150-300 minutes a week of moderate-intensity, or 75-150 minutes a week of vigorous-intensity aerobic physical activity, or an equivalent combination of moderate- and vigorous-intensity aerobic activity. Adults should perform muscle-strengthening activities on 2 or more days a week. Older adults should  do multicomponent physical activity that includes balance training as well as aerobic and muscle-strengthening activities. Benefits of increased physical activity include lower risk of mortality including cardiovascular mortality, lower risk of cardiovascular events and associated risk factors (hypertension and diabetes), and lower risk of many cancers (including bladder, breast, colon, endometrium, esophagus, kidney, lung, and stomach). Additional improvments have been seen in cognition, risk of dementia, anxiety and depression, improved bone health, lower risk of falls, and associated injuries.  Dietary recommendation The 2019 ACC/AHA guidelines promote nutrition as a main fixture of cardiovascular wellness, with a recommendation for a varied diet of fruit, vegetables, fish, legumes, and whole grains (Class I), as well as recommendations to reduce sodium, cholesterol, processed meats, and refined sugars (Class IIa recommendation).10 Sodium intake, a topic of some controversy as of late, is recommended to be kept at 1,500 mg/day or less, far below the average daily intake in the [place] of 3,409 mg/day, and notably below that of previous US  recommendations for 300mg /day.10,11 For those unable to reach 1,500 mg/day, they recommend at least a reduction of 1000 mg/day.  A Pesco-Mediterranean Diet With Intermittent Fasting: JACC Review Topic of the Week. J Am Coll Cardiol 2020;76:1484-1493 Pesco-Mediterranean diet, it is supplemented with extra-virgin olive oil (EVOO), which is the principle fat source, along with moderate amounts of dairy (particularly yogurt and cheese) and eggs, as well as modest amounts of alcohol consumption (ideally red wine with the evening meal), but few red and processed meats.

## 2023-12-01 NOTE — Progress Notes (Unsigned)
 Enrolled for [employer] to mail a ZIO XT long term holter monitor to the patients address on file.

## 2023-12-01 NOTE — Progress Notes (Signed)
 Cardiology Office Note:  .   Date:  12/01/2023  ID:  Julie Turner, DOB 01/31/1956, MRN [id number] PCP: Julie Poling, DO   HeartCare Providers Cardiologist:  Julie Mainland, MD PCP: Julie Poling, DO  Chief Complaint  Patient presents with   Coronary Artery Disease           History of Present Illness: .    Julie Turner is a 68 y.o. female with hypertension, hyperlipidemia, CAD s/p CABG x3 (LIMA-LAD, SVG-ramus and RCA), PAD, major depression.   Since her last visit with me, she has made significant changes to her diet, as well as her medication compliance.  She has been strictly eating "carnivore" diet, that is high on beef, butter, bacon, eggs, with 0 vegetables.  She is also cut out a lot of sugar, as she does not feel "hungry".  In the meantime, she is also stopped taking her Repatha.  Reviewed recent lab results with the patient, details below.  She denies any chest pain, but has had episodes of rapid heartbeat that wake her up from middle of night, last for several minutes.  Patient associates these with panic attacks.  Vitals:   12/01/23 1517  BP: 118/60  Pulse: 79  SpO2: 98%     ROS:  Review of Systems  Cardiovascular:  Positive for palpitations. Negative for chest pain, dyspnea on exertion, leg swelling and syncope.        Studies Reviewed: Marland Kitchen        EKG 12/01/2023: Normal sinus rhythm Rightward axis Septal infarct , age undetermined When compared with ECG of 09-Aug-2014 16:58, Septal infarct is now Present  Independently interpreted 09/2023: Chol 210, TG 178, HDL 44, LDL 134 HbA1C 5.6% Hb 15 Cr 0.8    Physical Exam:   Physical Exam Vitals and nursing note reviewed.  Constitutional:      General: She is not in acute distress. Neck:     Vascular: No JVD.  Cardiovascular:     Rate and Rhythm: Normal rate and regular rhythm.     Heart sounds: Normal heart sounds. No murmur heard. Pulmonary:     Effort: Pulmonary effort is normal.      Breath sounds: Normal breath sounds. No wheezing or rales.  Musculoskeletal:     Right lower leg: No edema.     Left lower leg: No edema.      VISIT DIAGNOSES:   ICD-10-CM   1. Essential hypertension  I10 Lipid panel    Lipid panel    2. Coronary artery disease involving native coronary artery of native heart without angina pectoris  I25.10 EKG 12-Lead    Evolocumab (REPATHA SURECLICK) 140 MG/ML SOAJ    Lipid panel    Lipid panel    3. Palpitations  R00.2 LONG TERM MONITOR (3-14 DAYS)    4. PAD (peripheral artery disease) (HCC)  I73.9 Lipid panel    Lipid panel    5. Statin myopathy  G72.0 Evolocumab (REPATHA SURECLICK) 140 MG/ML SOAJ   T46.6X5A     6. Primary hypertension  I10 hydrochlorothiazide (HYDRODIURIL) 25 MG tablet    losartan (COZAAR) 50 MG tablet       ASSESSMENT AND PLAN: .    Julie Turner is a 68 y.o. female with hypertension, hyperlipidemia, CAD s/p CABG x3 (LIMA-LAD, SVG-ramus and RCA), PAD, major depression.   Palpitations: Recommend 2-week ZIO monitor.  CAD: No anginal symptoms at this time since CABG (06/2022). Continue aspirin 81 mg daily. Reported statin intolerance in the past.  She was taking Repatha with LDL down to 42.  For unclear reason, she has stopped taking Repatha, and is also switched to "carnivore diet".  As result, her LDL is up to 134. I counseled her regarding cutting down high saturated fat and red meat intake, and include more heart healthy diet.  I also shared with her some recommendations based on ACC guidelines regarding diet and lifestyle. Resume Repatha. Check lipid panel in 3 months.   PAD: No claudication symptoms at this time, no CLI. Continue aggressive medical management as above.   Hypertension: Controlled. At her request, I refilled losartan, hydrochlorothiazide, metoprolol which all of control of blood pressure very well.  Mixed hyperlipidemia: As above.      Meds ordered this encounter  Medications    aspirin EC 81 MG tablet    Sig: Take 1 tablet (81 mg total) by mouth daily.    Dispense:  90 tablet    Refill:  3   Evolocumab (REPATHA SURECLICK) 140 MG/ML SOAJ    Sig: Inject 140 mg into the skin every 14 (fourteen) days.    Dispense:  6 mL    Refill:  5   hydrochlorothiazide (HYDRODIURIL) 25 MG tablet    Sig: Take 1 tablet (25 mg total) by mouth daily.    Dispense:  90 tablet    Refill:  3   losartan (COZAAR) 50 MG tablet    Sig: Take 1 tablet (50 mg total) by mouth daily.    Dispense:  90 tablet    Refill:  3   metoprolol succinate (TOPROL-XL) 100 MG 24 hr tablet    Sig: Take 1 tablet (100 mg total) by mouth daily.    Dispense:  90 tablet    Refill:  3     F/u in 6 months  Signed, Julie Negus, MD

## 2023-12-01 NOTE — Addendum Note (Signed)
 Addended by: Neita Goodnight B on: 12/01/2023 05:12 PM   Modules accepted: Orders

## 2023-12-03 ENCOUNTER — Ambulatory Visit

## 2023-12-03 DIAGNOSIS — R252 Cramp and spasm: Secondary | ICD-10-CM | POA: Diagnosis not present

## 2023-12-03 DIAGNOSIS — M6281 Muscle weakness (generalized): Secondary | ICD-10-CM

## 2023-12-03 DIAGNOSIS — M25551 Pain in right hip: Secondary | ICD-10-CM | POA: Diagnosis not present

## 2023-12-03 DIAGNOSIS — R262 Difficulty in walking, not elsewhere classified: Secondary | ICD-10-CM | POA: Diagnosis not present

## 2023-12-03 NOTE — Therapy (Signed)
 OUTPATIENT PHYSICAL THERAPY LOWER EXTREMITY TREATMENT   Patient Name: Julie Turner MRN: [id number] DOB:Jul 15, 1956, 68 y.o., female Today's Date: 12/03/2023  END OF SESSION:  PT End of Session - 12/03/23 1404     Visit Number 2    Date for PT Re-Evaluation 01/09/24    Authorization Type [employer] Medicare Dual    Progress Note Due on Visit 10    PT Start Time 1404    PT Stop Time 1445    PT Time Calculation (min) 41 min    Activity Tolerance Patient tolerated treatment well    Behavior During Therapy WFL for tasks assessed/performed             Past Medical History:  Diagnosis Date   Anxiety    Arthritis    "all my joints"   Breast cancer, right breast (HCC) 2001   S/P lumpecbomy and radiation   Depression    Fibromyalgia    GERD (gastroesophageal reflux disease)    High cholesterol    Hypertension    "just when I had radiation in 2001"   Migraine    "when I don't take my ASA qd" (08/15/2014)   Numbness and tingling in left hand    Spinal headache    Stroke (HCC) ~ 2013   "silent stroke" per her neurologist   Past Surgical History:  Procedure Laterality Date   BREAST LUMPECTOMY Right 09/10/1999   BREAST LUMPECTOMY Right    2nd time w/"all the lymph nodes under my arm"   BREAST RECONSTRUCTION Bilateral    CESAREAN SECTION  1987; 1988   CHOLECYSTECTOMY  09/10/1999   CORONARY ARTERY BYPASS GRAFT  06/2022   DILATION AND CURETTAGE OF UTERUS  09/10/1999   FOOT SURGERY Left ~ 2012   joint cleaned out   JOINT REPLACEMENT     TOTAL HIP ARTHROPLASTY Right 08/15/2014   TOTAL HIP ARTHROPLASTY Right 08/15/2014   Procedure: RIGHT TOTAL HIP ARTHROPLASTY;  Surgeon: Nestor Lewandowsky, MD;  Location: [facility] OR;  Service: Orthopedics;  Laterality: Right;   Patient Active Problem List   Diagnosis Date Noted   Palpitations 12/01/2023   PAD (peripheral artery disease) (HCC) 12/17/2022   Coronary artery disease of native artery of native heart with stable angina pectoris (HCC) 08/09/2022    Statin myopathy 08/09/2022   Mixed hyperlipidemia 08/09/2022   S/P CABG (coronary artery bypass graft) 08/09/2022   Primary hypertension 08/09/2022   Low back pain 11/22/2021   Leg cramps 01/18/2019   Decreased exercise tolerance 01/18/2019   Arthritis of right hip 08/15/2014    PCP: Jackelyn Poling, DO  REFERRING PROVIDER: Gean Birchwood, MD  REFERRING DIAG: M70.61 (ICD-10-CM) - Trochanteric bursitis, right hip  THERAPY DIAG:  Muscle weakness (generalized)  Cramp and spasm  Difficulty in walking, not elsewhere classified  Pain in right hip  Rationale for Evaluation and Treatment: Rehabilitation  ONSET DATE: "a couple of weeks, maybe 3"  SUBJECTIVE:   SUBJECTIVE STATEMENT: Patient reports frustration over the hip still hurting and leg pain that is unexplained.    PERTINENT HISTORY: Carnivore Diet Right THA in 2015, Hx of breast cancer, Right great toe pain, OA  PAIN:  Are you having pain? Yes: NPRS scale: 8-9/10 Pain location: right hip Pain description: "a muscle spasm"  "I feel like I have a Charley Horse in my hip that won't leg go" Aggravating factors: unknown Relieving factors: unknown  PRECAUTIONS: None  RED FLAGS: None   WEIGHT BEARING RESTRICTIONS: No  FALLS:  Has patient fallen  in last 6 months? 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  PATIENT GOALS: To be pain free and be more active to hike and exercise.  NEXT MD VISIT: As needed  OBJECTIVE:  Note: Objective measures were completed at Evaluation unless otherwise noted.  DIAGNOSTIC FINDINGS:  Pt reports that they did radiographs at the ortho office and patient was diagnosed with bursitis.  PATIENT SURVEYS:  LEFS 10 / 80 = 12.5 %  COGNITION: Overall cognitive status: Within  functional limits for tasks assessed     SENSATION: Patient reports some numbness and tingling down her right leg   MUSCLE LENGTH: Hamstrings: Tightness bilateral  POSTURE: rounded shoulders and forward head  PALPATION: Patient is tender to palpation along right hip  LOWER EXTREMITY ROM:  WFL with some pain on right hip and right great toe  LOWER EXTREMITY MMT:  Eval: Left LE is WFL Right hip strength is grossly 4/5   FUNCTIONAL TESTS:  Eval: 5 times sit to stand: 13.74 sec Timed up and go (TUG): 9.65 sec  GAIT: Distance walked: >500 ft Assistive device utilized: None Level of assistance: Complete Independence Comments: Patient reports antalgic gait                                                                                                                                TODAY'S TREATMENT  DATE: 12/03/2023 Lengthy discussion about the various possible causes of her thigh pain Education on anatomy of the hip and how bursitis progresses Education on compensatory patterns and kinetic chain reactions to hip pain Reviewed HEP Trigger Point Dry Needling Initial Treatment: Pt instructed on Dry Needling rational, procedures, and possible side effects. Pt instructed to expect mild to moderate muscle soreness later in the day and/or into the next day.  Pt instructed in methods to reduce muscle soreness. Pt instructed to continue prescribed HEP. Because Dry Needling was performed over or adjacent to a lung field, pt was educated on S/S of pneumothorax and to seek immediate medical attention should they occur.  Patient was educated on signs and symptoms of infection and other risk factors and advised to seek medical attention should they occur.  Patient verbalized understanding of these instructions and education.   Patient Verbal Consent Given: Yes Education Handout Provided: Previously Provided Muscles Treated: Lateral quadricep Electrical Stimulation Performed:  No Treatment Response/Outcome: Skilled palpation used to identify taut bands and trigger points.  Once identified, dry needling techniques used to treat these areas.  Twitch response ellicited along with palpable elongation of muscle.  Following treatment, patient reported generalized soreness but decreased muscle tension.    DATE: 11/18/2023  Review of HEP and aquatic therapy Manual Therapy:  instrument assisted manual therapy with Addaday secondary to reports of  increased pain and feelings of cramping to promote tissue elongation.   PATIENT EDUCATION:  Education details: Issued HEP and provided handouts for aquatic PT Person educated: Patient Education method: Explanation, Demonstration, and Handouts Education comprehension: verbalized understanding  HOME EXERCISE PROGRAM: Access Code: [email or web address] URL: [email or web address] Date: 11/18/2023 Prepared by: Clydie Braun Menke  Exercises - Seated Hamstring Stretch  - 1 x daily - 7 x weekly - 2 reps - 20 sec hold - Seated Long Arc Quad  - 1 x daily - 7 x weekly - 2 sets - 10 reps - Small Range Straight Leg Raise  - 1 x daily - 7 x weekly - 2 sets - 10 reps - Clamshell  - 1 x daily - 7 x weekly - 2 sets - 10 reps  ASSESSMENT:  CLINICAL IMPRESSION: Kacie needed a significant amount of education and discussion about her pain today.  She also needed review of HEP.  She was not able to reproduce any of her exercises assigned on evaluation.  We added IT band stretching to HEP and corrected technique on all previously provided exercises. We also added DN to lateral quad.  Patient had quite reactive trigger points with strong twitch responses all along lateral quad.  She should gain some relief in her anterior thigh with today's treatment.    OBJECTIVE IMPAIRMENTS: decreased balance, difficulty walking, decreased strength, increased muscle spasms, impaired flexibility, postural dysfunction, and pain.   ACTIVITY LIMITATIONS: lifting,  bending, sitting, standing, squatting, sleeping, and stairs  PARTICIPATION LIMITATIONS: cleaning, laundry, community activity, and yard work  PERSONAL FACTORS: Time since onset of injury/illness/exacerbation and 3+ comorbidities: OA, Hx of right THA, Hx of breast cancer  are also affecting patient's functional outcome.   REHAB POTENTIAL: Good  CLINICAL DECISION MAKING: Evolving/moderate complexity  EVALUATION COMPLEXITY: Moderate   GOALS: Goals reviewed with patient? Yes  SHORT TERM GOALS: Target date: 12/12/2023 Patient will be independent with initial HEP. Baseline: Goal status: INITIAL  2.  Patient will report at least a 25% improvement in right hip pain. Baseline:  Goal status: INITIAL   LONG TERM GOALS: Target date: 01/09/2024  Patient will be independent with advanced HEP to allow for self progression after discharge. Baseline:  Goal status: INITIAL  2.  Patient will improve Lower Extremity Functional Scale (LEFS) to at least 50% to demonstrate improvements in functional tasks. Baseline: 12.5% Goal status: INITIAL  3.  Patient will increase right hip pain to at least 4+ to 5-/5 to allow her to hike on various terrain and navigate stairs with improved ease. Baseline: 4/5 Goal status: INITIAL  4.  Patient will report ability to return to hiking for at least 2 miles without increased pain. Baseline:  Goal status: INITIAL   PLAN:  PT FREQUENCY: 2x/week  PT DURATION: 8 weeks  PLANNED INTERVENTIONS: 97164- PT Re-evaluation, 97110-Therapeutic exercises, 97530- Therapeutic activity, [id number]- Neuromuscular re-education, 97535- Self Care, 11914- Manual therapy, [id number]- Gait training, [phone]- Canalith repositioning, [id number]- Aquatic Therapy, [phone]- Electrical stimulation (unattended), [phone]- Electrical stimulation (manual), [id number]- Vasopneumatic device, [id number]- Ultrasound, [id number]- Traction (mechanical), [id number]- Ionotophoresis 4mg /ml Dexamethasone, Patient/Family education, Balance  training, Stair training, Taping, Dry Needling, Joint mobilization, Joint manipulation, Spinal manipulation, Spinal mobilization, Vestibular training, Cryotherapy, and Moist heat  PLAN FOR NEXT SESSION: Assess response to DN and progress HEP as indicated, strengthening, flexibility, continue manual/dry needling as indicated    Augusten Lipkin B. Jaques Mineer, PT 12/03/23 8:03 PM [facility] [address], [address] [place], [place] [address] Phone # [phone] Fax [phone]

## 2023-12-08 DIAGNOSIS — E782 Mixed hyperlipidemia: Secondary | ICD-10-CM | POA: Diagnosis not present

## 2023-12-08 DIAGNOSIS — I214 Non-ST elevation (NSTEMI) myocardial infarction: Secondary | ICD-10-CM | POA: Diagnosis not present

## 2023-12-08 DIAGNOSIS — I2510 Atherosclerotic heart disease of native coronary artery without angina pectoris: Secondary | ICD-10-CM | POA: Diagnosis not present

## 2023-12-08 DIAGNOSIS — I10 Essential (primary) hypertension: Secondary | ICD-10-CM | POA: Diagnosis not present

## 2023-12-10 ENCOUNTER — Ambulatory Visit: Attending: Orthopedic Surgery

## 2023-12-10 DIAGNOSIS — R262 Difficulty in walking, not elsewhere classified: Secondary | ICD-10-CM | POA: Diagnosis not present

## 2023-12-10 DIAGNOSIS — R252 Cramp and spasm: Secondary | ICD-10-CM | POA: Diagnosis not present

## 2023-12-10 DIAGNOSIS — M6281 Muscle weakness (generalized): Secondary | ICD-10-CM

## 2023-12-10 DIAGNOSIS — M25551 Pain in right hip: Secondary | ICD-10-CM | POA: Diagnosis not present

## 2023-12-10 NOTE — Therapy (Signed)
 OUTPATIENT PHYSICAL THERAPY LOWER EXTREMITY TREATMENT   Patient Name: Julie Turner MRN: [id number] DOB:11-May-1956, 68 y.o., female Today's Date: 12/10/2023  END OF SESSION:  PT End of Session - 12/10/23 1453     Visit Number 3    Date for PT Re-Evaluation 01/09/24    Authorization Type UHC Medicare Dual    Progress Note Due on Visit 10    PT Start Time 1448    PT Stop Time 1530    PT Time Calculation (min) 42 min    Activity Tolerance Patient tolerated treatment well    Behavior During Therapy WFL for tasks assessed/performed             Past Medical History:  Diagnosis Date   Anxiety    Arthritis    "all my joints"   Breast cancer, right breast (HCC) 2001   S/P lumpecbomy and radiation   Depression    Fibromyalgia    GERD (gastroesophageal reflux disease)    High cholesterol    Hypertension    "just when I had radiation in 2001"   Migraine    "when I don't take my ASA qd" (08/15/2014)   Numbness and tingling in left hand    Spinal headache    Stroke (HCC) ~ 2013   "silent stroke" per her neurologist   Past Surgical History:  Procedure Laterality Date   BREAST LUMPECTOMY Right 09/10/1999   BREAST LUMPECTOMY Right    2nd time w/"all the lymph nodes under my arm"   BREAST RECONSTRUCTION Bilateral    CESAREAN SECTION  1987; 1988   CHOLECYSTECTOMY  09/10/1999   CORONARY ARTERY BYPASS GRAFT  06/2022   DILATION AND CURETTAGE OF UTERUS  09/10/1999   FOOT SURGERY Left ~ 2012   joint cleaned out   JOINT REPLACEMENT     TOTAL HIP ARTHROPLASTY Right 08/15/2014   TOTAL HIP ARTHROPLASTY Right 08/15/2014   Procedure: RIGHT TOTAL HIP ARTHROPLASTY;  Surgeon: Nestor Lewandowsky, MD;  Location: [facility] OR;  Service: Orthopedics;  Laterality: Right;   Patient Active Problem List   Diagnosis Date Noted   Palpitations 12/01/2023   PAD (peripheral artery disease) (HCC) 12/17/2022   Coronary artery disease of native artery of native heart with stable angina pectoris (HCC) 08/09/2022    Statin myopathy 08/09/2022   Mixed hyperlipidemia 08/09/2022   S/P CABG (coronary artery bypass graft) 08/09/2022   Primary hypertension 08/09/2022   Low back pain 11/22/2021   Leg cramps 01/18/2019   Decreased exercise tolerance 01/18/2019   Arthritis of right hip 08/15/2014    PCP: Jackelyn Poling, DO  REFERRING PROVIDER: Gean Birchwood, MD  REFERRING DIAG: M70.61 (ICD-10-CM) - Trochanteric bursitis, right hip  THERAPY DIAG:  Muscle weakness (generalized)  Cramp and spasm  Difficulty in walking, not elsewhere classified  Pain in right hip  Rationale for Evaluation and Treatment: Rehabilitation  ONSET DATE: "a couple of weeks, maybe 3"  SUBJECTIVE:   SUBJECTIVE STATEMENT: Patient reports I am able to lie on my right side now.  I am able to sleep better.  I still have the thigh pain.  "I think its my electrolytes".  So I am drinking more water and making sure I am hydrated.     PERTINENT HISTORY: Carnivore Diet Right THA in 2015, Hx of breast cancer, Right great toe pain, OA  PAIN:  12/10/23 Are you having pain? Yes: NPRS scale: 2/10 Pain location: right hip Pain description: "a muscle spasm"  "I feel like I have a [address] in my hip that won't leg go" Aggravating factors: unknown Relieving factors: unknown  PRECAUTIONS: None  RED FLAGS: None   WEIGHT BEARING RESTRICTIONS: No  FALLS:  Has patient fallen in last 6 months? 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  PATIENT GOALS: To be pain free and be more active to hike and exercise.  NEXT MD VISIT: As needed  OBJECTIVE:  Note: Objective measures were completed at Evaluation unless otherwise noted.  DIAGNOSTIC FINDINGS:  Pt reports that they did radiographs at the ortho office and patient  was diagnosed with bursitis.  PATIENT SURVEYS:  LEFS 10 / 80 = 12.5 %  COGNITION: Overall cognitive status: Within functional limits for tasks assessed     SENSATION: Patient reports some numbness and tingling down her right leg   MUSCLE LENGTH: Hamstrings: Tightness bilateral  POSTURE: rounded shoulders and forward head  PALPATION: Patient is tender to palpation along right hip  LOWER EXTREMITY ROM:  WFL with some pain on right hip and right great toe  LOWER EXTREMITY MMT:  Eval: Left LE is WFL Right hip strength is grossly 4/5   FUNCTIONAL TESTS:  Eval: 5 times sit to stand: 13.74 sec Timed up and go (TUG): 9.65 sec  GAIT: Distance walked: >500 ft Assistive device utilized: None Level of assistance: Complete Independence Comments: Patient reports antalgic gait                                                                                                                                TODAY'S TREATMENT  DATE: 12/09/2023 Discussion about progress and response to DN Hook lying clam x 20 with yellow loop Side lying clam 2 x 10 each side with yellow loop 4 way hip with 0# 2 x 10 each LE (SLR, hip abduction, hip adduction, hip extension) Prone quad stretch with green stretch strap x 5 holding 10 seconds each IT band rolling x 1 min Trigger Point Dry Needling Initial Treatment: Pt instructed on Dry Needling rational, procedures, and possible side effects. Pt instructed to expect mild to moderate muscle soreness later in the day and/or into the next day.  Pt instructed in methods to reduce muscle soreness. Pt instructed to continue prescribed HEP. Because Dry Needling was performed over or adjacent to a lung field, pt was educated on S/S of pneumothorax and to seek immediate medical attention should they occur.  Patient was educated on signs and symptoms of infection and other risk factors and advised to seek medical attention should they occur.  Patient verbalized  understanding of these instructions and education.  Patient Verbal Consent Given: Yes Education Handout Provided: Previously Provided Muscles Treated: Lateral quadricep Electrical Stimulation Performed: No Treatment Response/Outcome: Skilled palpation used to identify taut bands  and trigger points.  Once identified, dry needling techniques used to treat these areas.  Twitch response ellicited along with palpable elongation of muscle.  Following treatment, patient reported generalized soreness but decreased muscle tension.     DATE: 12/03/2023 Lengthy discussion about the various possible causes of her thigh pain Education on anatomy of the hip and how bursitis progresses Education on compensatory patterns and kinetic chain reactions to hip pain Reviewed HEP Trigger Point Dry Needling Initial Treatment: Pt instructed on Dry Needling rational, procedures, and possible side effects. Pt instructed to expect mild to moderate muscle soreness later in the day and/or into the next day.  Pt instructed in methods to reduce muscle soreness. Pt instructed to continue prescribed HEP. Because Dry Needling was performed over or adjacent to a lung field, pt was educated on S/S of pneumothorax and to seek immediate medical attention should they occur.  Patient was educated on signs and symptoms of infection and other risk factors and advised to seek medical attention should they occur.  Patient verbalized understanding of these instructions and education.   Patient Verbal Consent Given: Yes Education Handout Provided: Previously Provided Muscles Treated: Lateral quadricep Electrical Stimulation Performed: No Treatment Response/Outcome: Skilled palpation used to identify taut bands and trigger points.  Once identified, dry needling techniques used to treat these areas.  Twitch response ellicited along with palpable elongation of muscle.  Following treatment, patient reported generalized soreness but decreased  muscle tension.    DATE: 11/18/2023  Review of HEP and aquatic therapy Manual Therapy:  instrument assisted manual therapy with Addaday secondary to reports of increased pain and feelings of cramping to promote tissue elongation.   PATIENT EDUCATION:  Education details: Issued HEP and provided handouts for aquatic PT Person educated: Patient Education method: Explanation, Demonstration, and Handouts Education comprehension: verbalized understanding  HOME EXERCISE PROGRAM: Access Code: [email or web address] URL: [email or web address] Date: 11/18/2023 Prepared by: Clydie Braun Menke  Exercises - Seated Hamstring Stretch  - 1 x daily - 7 x weekly - 2 reps - 20 sec hold - Seated Long Arc Quad  - 1 x daily - 7 x weekly - 2 sets - 10 reps - Small Range Straight Leg Raise  - 1 x daily - 7 x weekly - 2 sets - 10 reps - Clamshell  - 1 x daily - 7 x weekly - 2 sets - 10 reps  ASSESSMENT:  CLINICAL IMPRESSION: Julie Turner reports a reduction of pain from 8/10 to 2/10.  She was able to tolerate several new exercises and was non tender to rolling.  She had significant twitch response and distal lateral quad on DN.  She was able to complete all exercises today with minimal increase in pain.    OBJECTIVE IMPAIRMENTS: decreased balance, difficulty walking, decreased strength, increased muscle spasms, impaired flexibility, postural dysfunction, and pain.   ACTIVITY LIMITATIONS: lifting, bending, sitting, standing, squatting, sleeping, and stairs  PARTICIPATION LIMITATIONS: cleaning, laundry, community activity, and yard work  PERSONAL FACTORS: Time since onset of injury/illness/exacerbation and 3+ comorbidities: OA, Hx of right THA, Hx of breast cancer  are also affecting patient's functional outcome.   REHAB POTENTIAL: Good  CLINICAL DECISION MAKING: Evolving/moderate complexity  EVALUATION COMPLEXITY: Moderate   GOALS: Goals reviewed with patient? Yes  SHORT TERM GOALS: Target date:  12/12/2023 Patient will be independent with initial HEP. Baseline: Goal status: INITIAL  2.  Patient will report at least a 25% improvement in right hip pain. Baseline:  Goal status: INITIAL   LONG TERM GOALS:  Target date: 01/09/2024  Patient will be independent with advanced HEP to allow for self progression after discharge. Baseline:  Goal status: INITIAL  2.  Patient will improve Lower Extremity Functional Scale (LEFS) to at least 50% to demonstrate improvements in functional tasks. Baseline: 12.5% Goal status: INITIAL  3.  Patient will increase right hip pain to at least 4+ to 5-/5 to allow her to hike on various terrain and navigate stairs with improved ease. Baseline: 4/5 Goal status: INITIAL  4.  Patient will report ability to return to hiking for at least 2 miles without increased pain. Baseline:  Goal status: INITIAL   PLAN:  PT FREQUENCY: 2x/week  PT DURATION: 8 weeks  PLANNED INTERVENTIONS: 97164- PT Re-evaluation, 97110-Therapeutic exercises, 97530- Therapeutic activity, [id number]- Neuromuscular re-education, 97535- Self Care, [id number]- Manual therapy, [id number]- Gait training, [phone]- Canalith repositioning, [id number]- Aquatic Therapy, [id number]- Electrical stimulation (unattended), [phone]- Electrical stimulation (manual), [id number]- Vasopneumatic device, [id number]- Ultrasound, [id number]- Traction (mechanical), [id number]- Ionotophoresis 4mg /ml Dexamethasone, Patient/Family education, Balance training, Stair training, Taping, Dry Needling, Joint mobilization, Joint manipulation, Spinal manipulation, Spinal mobilization, Vestibular training, Cryotherapy, and Moist heat  PLAN FOR NEXT SESSION: Assess response to DN #2 and progress HEP as indicated, strengthening, flexibility, continue manual/dry needling as indicated    Julie Turner, PT 12/10/23 4:29 PM [facility] [address] [place], [place] [address] Phone # [phone] Fax [phone]

## 2023-12-17 ENCOUNTER — Ambulatory Visit

## 2023-12-17 DIAGNOSIS — M6281 Muscle weakness (generalized): Secondary | ICD-10-CM

## 2023-12-17 DIAGNOSIS — R252 Cramp and spasm: Secondary | ICD-10-CM

## 2023-12-17 DIAGNOSIS — R262 Difficulty in walking, not elsewhere classified: Secondary | ICD-10-CM

## 2023-12-17 DIAGNOSIS — M25551 Pain in right hip: Secondary | ICD-10-CM

## 2023-12-17 NOTE — Therapy (Signed)
 OUTPATIENT PHYSICAL THERAPY LOWER EXTREMITY TREATMENT   Patient Name: Julie Turner MRN: [id number] DOB:October 06, 1955, 68 y.o., female Today's Date: 12/17/2023  END OF SESSION:  PT End of Session - 12/17/23 1447     Visit Number 4    Date for PT Re-Evaluation 01/09/24    Authorization Type UHC Medicare Dual    Progress Note Due on Visit 10    PT Start Time 1407    PT Stop Time 1434    PT Time Calculation (min) 27 min    Activity Tolerance Treatment limited secondary to agitation    Behavior During Therapy Agitated             Past Medical History:  Diagnosis Date   Anxiety    Arthritis    "all my joints"   Breast cancer, right breast (HCC) 2001   S/P lumpecbomy and radiation   Depression    Fibromyalgia    GERD (gastroesophageal reflux disease)    High cholesterol    Hypertension    "just when I had radiation in 2001"   Migraine    "when I don't take my ASA qd" (08/15/2014)   Numbness and tingling in left hand    Spinal headache    Stroke (HCC) ~ 2013   "silent stroke" per her neurologist   Past Surgical History:  Procedure Laterality Date   BREAST LUMPECTOMY Right 09/10/1999   BREAST LUMPECTOMY Right    2nd time w/"all the lymph nodes under my arm"   BREAST RECONSTRUCTION Bilateral    CESAREAN SECTION  1987; 1988   CHOLECYSTECTOMY  09/10/1999   CORONARY ARTERY BYPASS GRAFT  06/2022   DILATION AND CURETTAGE OF UTERUS  09/10/1999   FOOT SURGERY Left ~ 2012   joint cleaned out   JOINT REPLACEMENT     TOTAL HIP ARTHROPLASTY Right 08/15/2014   TOTAL HIP ARTHROPLASTY Right 08/15/2014   Procedure: RIGHT TOTAL HIP ARTHROPLASTY;  Surgeon: Nestor Lewandowsky, MD;  Location: [facility] OR;  Service: Orthopedics;  Laterality: Right;   Patient Active Problem List   Diagnosis Date Noted   Palpitations 12/01/2023   PAD (peripheral artery disease) (HCC) 12/17/2022   Coronary artery disease of native artery of native heart with stable angina pectoris (HCC) 08/09/2022   Statin myopathy  08/09/2022   Mixed hyperlipidemia 08/09/2022   S/P CABG (coronary artery bypass graft) 08/09/2022   Primary hypertension 08/09/2022   Low back pain 11/22/2021   Leg cramps 01/18/2019   Decreased exercise tolerance 01/18/2019   Arthritis of right hip 08/15/2014    PCP: Jackelyn Poling, DO  REFERRING PROVIDER: Gean Birchwood, MD  REFERRING DIAG: M70.61 (ICD-10-CM) - Trochanteric bursitis, right hip  THERAPY DIAG:  Pain in right hip  Difficulty in walking, not elsewhere classified  Muscle weakness (generalized)  Cramp and spasm  Rationale for Evaluation and Treatment: Rehabilitation  ONSET DATE: "a couple of weeks, maybe 3"  SUBJECTIVE:   SUBJECTIVE STATEMENT: Patient arrived in lobby but did not check in at front desk.  She had checked in online and didn't know she had to let us know that she had arrived.  She was upset that she was being brought back late.  Once back in a room, patient explained that she is still having pain and that its going down into the lower leg as well.    PERTINENT HISTORY: Carnivore Diet Right THA in 2015, Hx of breast cancer, Right great toe pain, OA  PAIN:  12/10/23 Are you having pain? Yes: NPRS  scale: 2/10 Pain location: right hip Pain description: "a muscle spasm"  "I feel like I have a [place] in my hip that won't leg go" Aggravating factors: unknown Relieving factors: unknown  PRECAUTIONS: None  RED FLAGS: None   WEIGHT BEARING RESTRICTIONS: No  FALLS:  Has patient fallen in last 6 months? 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  PATIENT GOALS: To be pain free and be more active to hike and exercise.  NEXT MD VISIT: As needed  OBJECTIVE:  Note: Objective measures were completed at Evaluation unless otherwise  noted.  DIAGNOSTIC FINDINGS:  Pt reports that they did radiographs at the ortho office and patient was diagnosed with bursitis.  PATIENT SURVEYS:  LEFS 10 / 80 = 12.5 %  COGNITION: Overall cognitive status: Within functional limits for tasks assessed     SENSATION: Patient reports some numbness and tingling down her right leg   MUSCLE LENGTH: Hamstrings: Tightness bilateral  POSTURE: rounded shoulders and forward head  PALPATION: Patient is tender to palpation along right hip  LOWER EXTREMITY ROM:  WFL with some pain on right hip and right great toe  LOWER EXTREMITY MMT:  Eval: Left LE is WFL Right hip strength is grossly 4/5   FUNCTIONAL TESTS:  Eval: 5 times sit to stand: 13.74 sec Timed up and go (TUG): 9.65 sec  GAIT: Distance walked: >500 ft Assistive device utilized: None Level of assistance: Complete Independence Comments: Patient reports antalgic gait                                                                                                                                TODAY'S TREATMENT  DATE: 12/17/2023 Brought patient back to a room per request from previous appts (aversion to noise) and she feels our acoustics are poor and she is unable to hear [place].   PT and patient began discussing her status.  She stated that the right leg did feel a little better after the needling but the tension in the leg came back and she still feels that the pain is still due to electrolyte imbalance.  We discussed that it would be unusual for only the right leg to be affected by an electrolyte imbalance.  She continued explaining that she just felt that there was something wrong that was missed.  PT inquired if she felt a referral to a neuro specialist would be helpful.  She went on to share that she does not have a lot of trust in the medical community and gave various examples of why she did not.  PT attempted to share different perspective but patient felt PT was "siding  with" the medical community.  She became increasingly agitated, sharing various examples of where her trust  was compromised.  She eventually stood and walked out of appt.    DATE: 12/09/2023 Discussion about progress and response to DN Hook lying clam x 20 with yellow loop Side lying clam 2 x 10 each side with yellow loop 4 way hip with 0# 2 x 10 each LE (SLR, hip abduction, hip adduction, hip extension) Prone quad stretch with green stretch strap x 5 holding 10 seconds each IT band rolling x 1 min Trigger Point Dry Needling Initial Treatment: Pt instructed on Dry Needling rational, procedures, and possible side effects. Pt instructed to expect mild to moderate muscle soreness later in the day and/or into the next day.  Pt instructed in methods to reduce muscle soreness. Pt instructed to continue prescribed HEP. Because Dry Needling was performed over or adjacent to a lung field, pt was educated on S/S of pneumothorax and to seek immediate medical attention should they occur.  Patient was educated on signs and symptoms of infection and other risk factors and advised to seek medical attention should they occur.  Patient verbalized understanding of these instructions and education.  Patient Verbal Consent Given: Yes Education Handout Provided: Previously Provided Muscles Treated: Lateral quadricep Electrical Stimulation Performed: No Treatment Response/Outcome: Skilled palpation used to identify taut bands and trigger points.  Once identified, dry needling techniques used to treat these areas.  Twitch response ellicited along with palpable elongation of muscle.  Following treatment, patient reported generalized soreness but decreased muscle tension.     DATE: 12/03/2023 Lengthy discussion about the various possible causes of her thigh pain Education on anatomy of the hip and how bursitis progresses Education on compensatory patterns and kinetic chain reactions to hip pain Reviewed HEP Trigger  Point Dry Needling Initial Treatment: Pt instructed on Dry Needling rational, procedures, and possible side effects. Pt instructed to expect mild to moderate muscle soreness later in the day and/or into the next day.  Pt instructed in methods to reduce muscle soreness. Pt instructed to continue prescribed HEP. Because Dry Needling was performed over or adjacent to a lung field, pt was educated on S/S of pneumothorax and to seek immediate medical attention should they occur.  Patient was educated on signs and symptoms of infection and other risk factors and advised to seek medical attention should they occur.  Patient verbalized understanding of these instructions and education.   Patient Verbal Consent Given: Yes Education Handout Provided: Previously Provided Muscles Treated: Lateral quadricep Electrical Stimulation Performed: No Treatment Response/Outcome: Skilled palpation used to identify taut bands and trigger points.  Once identified, dry needling techniques used to treat these areas.  Twitch response ellicited along with palpable elongation of muscle.  Following treatment, patient reported generalized soreness but decreased muscle tension.    DATE: 11/18/2023  Review of HEP and aquatic therapy Manual Therapy:  instrument assisted manual therapy with Addaday secondary to reports of increased pain and feelings of cramping to promote tissue elongation.   PATIENT EDUCATION:  Education details: Issued HEP and provided handouts for aquatic PT Person educated: Patient Education method: Explanation, Demonstration, and Handouts Education comprehension: verbalized understanding  HOME EXERCISE PROGRAM: Access Code: [email or web address] URL: [email or web address] Date: 11/18/2023 Prepared by: Clydie Braun Menke  Exercises - Seated Hamstring Stretch  - 1 x daily - 7 x weekly - 2 reps - 20 sec hold - Seated Long Arc Quad  - 1 x daily - 7 x weekly - 2 sets - 10 reps - Small Range Straight Leg  Raise  - 1 x daily - 7  x weekly - 2 sets - 10 reps - Clamshell  - 1 x daily - 7 x weekly - 2 sets - 10 reps  ASSESSMENT:  CLINICAL IMPRESSION: Neva did not complete appt today.   We did not charge due to no treatment.  Patient may not return.  We will DC if paitent does not return.    OBJECTIVE IMPAIRMENTS: decreased balance, difficulty walking, decreased strength, increased muscle spasms, impaired flexibility, postural dysfunction, and pain.   ACTIVITY LIMITATIONS: lifting, bending, sitting, standing, squatting, sleeping, and stairs  PARTICIPATION LIMITATIONS: cleaning, laundry, community activity, and yard work  PERSONAL FACTORS: Time since onset of injury/illness/exacerbation and 3+ comorbidities: OA, Hx of right THA, Hx of breast cancer  are also affecting patient's functional outcome.   REHAB POTENTIAL: Good  CLINICAL DECISION MAKING: Evolving/moderate complexity  EVALUATION COMPLEXITY: Moderate   GOALS: Goals reviewed with patient? Yes  SHORT TERM GOALS: Target date: 12/12/2023 Patient will be independent with initial HEP. Baseline: Goal status: INITIAL  2.  Patient will report at least a 25% improvement in right hip pain. Baseline:  Goal status: INITIAL   LONG TERM GOALS: Target date: 01/09/2024  Patient will be independent with advanced HEP to allow for self progression after discharge. Baseline:  Goal status: INITIAL  2.  Patient will improve Lower Extremity Functional Scale (LEFS) to at least 50% to demonstrate improvements in functional tasks. Baseline: 12.5% Goal status: INITIAL  3.  Patient will increase right hip pain to at least 4+ to 5-/5 to allow her to hike on various terrain and navigate stairs with improved ease. Baseline: 4/5 Goal status: INITIAL  4.  Patient will report ability to return to hiking for at least 2 miles without increased pain. Baseline:  Goal status: INITIAL  PLAN:  PT FREQUENCY: 2x/week  PT DURATION: 8 weeks  PLANNED  INTERVENTIONS: 97164- PT Re-evaluation, 97110-Therapeutic exercises, 97530- Therapeutic activity, [id number]- Neuromuscular re-education, 97535- Self Care, 16109- Manual therapy, [id number]- Gait training, [phone]- Canalith repositioning, [id number]- Aquatic Therapy, [phone]- Electrical stimulation (unattended), [phone]- Electrical stimulation (manual), [id number]- Vasopneumatic device, [id number]- Ultrasound, [id number]- Traction (mechanical), [id number]- Ionotophoresis 4mg /ml Dexamethasone, Patient/Family education, Balance training, Stair training, Taping, Dry Needling, Joint mobilization, Joint manipulation, Spinal manipulation, Spinal mobilization, Vestibular training, Cryotherapy, and Moist heat  PLAN FOR NEXT SESSION: Hold x 2 sessions.  If patient returns, keep all appts.    Victorino Dike B. Balen Woolum, PT 12/17/23 8:32 PM [facility] [address], [address] [place], [place] [address] Phone # [phone] Fax [phone]

## 2023-12-25 ENCOUNTER — Encounter

## 2023-12-30 ENCOUNTER — Encounter: Payer: Self-pay | Admitting: Physical Therapy

## 2023-12-30 ENCOUNTER — Ambulatory Visit: Admitting: Physical Therapy

## 2023-12-30 DIAGNOSIS — L98491 Non-pressure chronic ulcer of skin of other sites limited to breakdown of skin: Secondary | ICD-10-CM | POA: Diagnosis not present

## 2023-12-30 DIAGNOSIS — M25551 Pain in right hip: Secondary | ICD-10-CM

## 2023-12-30 DIAGNOSIS — R252 Cramp and spasm: Secondary | ICD-10-CM

## 2023-12-30 DIAGNOSIS — R262 Difficulty in walking, not elsewhere classified: Secondary | ICD-10-CM

## 2023-12-30 DIAGNOSIS — R6889 Other general symptoms and signs: Secondary | ICD-10-CM | POA: Diagnosis not present

## 2023-12-30 DIAGNOSIS — R5383 Other fatigue: Secondary | ICD-10-CM | POA: Diagnosis not present

## 2023-12-30 DIAGNOSIS — J309 Allergic rhinitis, unspecified: Secondary | ICD-10-CM | POA: Diagnosis not present

## 2023-12-30 DIAGNOSIS — M6281 Muscle weakness (generalized): Secondary | ICD-10-CM

## 2023-12-30 NOTE — Therapy (Signed)
 OUTPATIENT PHYSICAL THERAPY LOWER EXTREMITY TREATMENT   Patient Name: Julie Turner MRN: [id number] DOB:06/03/1956, 68 y.o., female Today's Date: 12/30/2023  END OF SESSION:  PT End of Session - 12/30/23 1020     Visit Number 5    Date for PT Re-Evaluation 01/09/24    Authorization Type [employer] Medicare Dual    Progress Note Due on Visit 10    PT Start Time 1020    PT Stop Time 1110    PT Time Calculation (min) 50 min    Activity Tolerance Patient tolerated treatment well    Behavior During Therapy WFL for tasks assessed/performed              Past Medical History:  Diagnosis Date   Anxiety    Arthritis    "all my joints"   Breast cancer, right breast (HCC) 2001   S/P lumpecbomy and radiation   Depression    Fibromyalgia    GERD (gastroesophageal reflux disease)    High cholesterol    Hypertension    "just when I had radiation in 2001"   Migraine    "when I don't take my ASA qd" (08/15/2014)   Numbness and tingling in left hand    Spinal headache    Stroke (HCC) ~ 2013   "silent stroke" per her neurologist   Past Surgical History:  Procedure Laterality Date   BREAST LUMPECTOMY Right 09/10/1999   BREAST LUMPECTOMY Right    2nd time w/"all the lymph nodes under my arm"   BREAST RECONSTRUCTION Bilateral    CESAREAN SECTION  1987; 1988   CHOLECYSTECTOMY  09/10/1999   CORONARY ARTERY BYPASS GRAFT  06/2022   DILATION AND CURETTAGE OF UTERUS  09/10/1999   FOOT SURGERY Left ~ 2012   joint cleaned out   JOINT REPLACEMENT     TOTAL HIP ARTHROPLASTY Right 08/15/2014   TOTAL HIP ARTHROPLASTY Right 08/15/2014   Procedure: RIGHT TOTAL HIP ARTHROPLASTY;  Surgeon: Ilean Mall, MD;  Location: [facility] OR;  Service: Orthopedics;  Laterality: Right;   Patient Active Problem List   Diagnosis Date Noted   Palpitations 12/01/2023   PAD (peripheral artery disease) (HCC) 12/17/2022   Coronary artery disease of native artery of native heart with stable angina pectoris (HCC) 08/09/2022    Statin myopathy 08/09/2022   Mixed hyperlipidemia 08/09/2022   S/P CABG (coronary artery bypass graft) 08/09/2022   Primary hypertension 08/09/2022   Low back pain 11/22/2021   Leg cramps 01/18/2019   Decreased exercise tolerance 01/18/2019   Arthritis of right hip 08/15/2014    PCP: Mordechai April, DO  REFERRING PROVIDER: Wendolyn Hamburger, MD  REFERRING DIAG: M70.61 (ICD-10-CM) - Trochanteric bursitis, right hip  THERAPY DIAG:  Pain in right hip  Difficulty in walking, not elsewhere classified  Muscle weakness (generalized)  Cramp and spasm  Rationale for Evaluation and Treatment: Rehabilitation  ONSET DATE: "a couple of weeks, maybe 3"  SUBJECTIVE:   SUBJECTIVE STATEMENT: Pt reports she is getting a lot of muscle spasms and twitches going on in the legs Rt>Lt. I wonder if some of it is related to my diet?  I am on salt, beef, eggs, bacon, butter diet - carnivore diet.  I have lost 35lb.  I pee a lot which is why I worry about my electrolytes - I just had my levels checked with blood work this morning.   My goals in coming in are to figure out how to get my legs stronger, work on squats, rising from  floor.  I feel like I've not been moving as much as I need to be and gotten weaker.    PERTINENT HISTORY: Carnivore Diet Right THA in 2015, Hx of breast cancer, Right great toe pain, OA  PAIN:  12/10/23 Are you having pain? Yes: NPRS scale: 2/10 Pain location: right hip Pain description: "a muscle spasm"  "I feel like I have a Charley Horse in my hip that won't leg go" Aggravating factors: unknown Relieving factors: unknown  PRECAUTIONS: None  RED FLAGS: None   WEIGHT BEARING RESTRICTIONS: No  FALLS:  Has patient fallen in last 6 months? No  LIVING ENVIRONMENT: Lives with: lives with their son Lives in: House/apartment Stairs:  2 level home Has following equipment at home: None  OCCUPATION: Retired  PLOF: Independent and Leisure: hiking, cooking, Clinical cytogeneticist .   Has a treadmill a home.  PATIENT GOALS: To be pain free and be more active to hike and exercise.  NEXT MD VISIT: As needed  OBJECTIVE:  Note: Objective measures were completed at Evaluation unless otherwise noted.  DIAGNOSTIC FINDINGS:  Pt reports that they did radiographs at the ortho office and patient was diagnosed with bursitis.  PATIENT SURVEYS:  LEFS 10 / 80 = 12.5 %  COGNITION: Overall cognitive status: Within functional limits for tasks assessed     SENSATION: Patient reports some numbness and tingling down her right leg   MUSCLE LENGTH: Hamstrings: Tightness bilateral  POSTURE: rounded shoulders and forward head  PALPATION: Patient is tender to palpation along right hip  LOWER EXTREMITY ROM:  WFL with some pain on right hip and right great toe  LOWER EXTREMITY MMT:  Eval: Left LE is WFL Right hip strength is grossly 4/5   FUNCTIONAL TESTS:  Eval: 5 times sit to stand: 13.74 sec Timed up and go (TUG): 9.65 sec  GAIT: Distance walked: >500 ft Assistive device utilized: None Level of assistance: Complete Independence Comments: Patient reports antalgic gait                                                                                                                                TODAY'S TREATMENT  12/30/23: Long discussion about current diet, bloodwork done (pending electrolyte check), activity level, symptoms and goals in coming in for PT LE stretches: standing quad, modified Thomas test for hip flexors, standing HS foot on chair, runner stretch, fig 4 seated and supine (prefers supine) Squat to chair x 10 with PT demo and cues for technique Dead lift holding bil 3lb weights x10 (Pt has 3lb ankle weights at home) SL hip abd x10 Standing counter hip ext and abd with ankle weights (Pt has ankle weights at home)  DATE: 12/17/2023 Brought patient back to a room per request from previous appts (aversion to noise) and she feels our acoustics are poor  and she is unable to hear us .   PT and patient began discussing her status.  She stated that  the right leg did feel a little better after the needling but the tension in the leg came back and she still feels that the pain is still due to electrolyte imbalance.  We discussed that it would be unusual for only the right leg to be affected by an electrolyte imbalance.  She continued explaining that she just felt that there was something wrong that was missed.  PT inquired if she felt a referral to a neuro specialist would be helpful.  She went on to share that she does not have a lot of trust in the medical community and gave various examples of why she did not.  PT attempted to share different perspective but patient felt PT was "siding with" the medical community.  She became increasingly agitated, sharing various examples of where her trust was compromised.  She eventually stood and walked out of appt.    DATE: 12/09/2023 Discussion about progress and response to DN Hook lying clam x 20 with yellow loop Side lying clam 2 x 10 each side with yellow loop 4 way hip with 0# 2 x 10 each LE (SLR, hip abduction, hip adduction, hip extension) Prone quad stretch with green stretch strap x 5 holding 10 seconds each IT band rolling x 1 min Trigger Point Dry Needling Initial Treatment: Pt instructed on Dry Needling rational, procedures, and possible side effects. Pt instructed to expect mild to moderate muscle soreness later in the day and/or into the next day.  Pt instructed in methods to reduce muscle soreness. Pt instructed to continue prescribed HEP. Because Dry Needling was performed over or adjacent to a lung field, pt was educated on S/S of pneumothorax and to seek immediate medical attention should they occur.  Patient was educated on signs and symptoms of infection and other risk factors and advised to seek medical attention should they occur.  Patient verbalized understanding of these instructions and  education.  Patient Verbal Consent Given: Yes Education Handout Provided: Previously Provided Muscles Treated: Lateral quadricep Electrical Stimulation Performed: No Treatment Response/Outcome: Skilled palpation used to identify taut bands and trigger points.  Once identified, dry needling techniques used to treat these areas.  Twitch response ellicited along with palpable elongation of muscle.  Following treatment, patient reported generalized soreness but decreased muscle tension.     PATIENT EDUCATION:  Education details: [email or web address], Issued HEP and provided handouts for aquatic PT Person educated: Patient Education method: Explanation, Demonstration, and Handouts Education comprehension: verbalized understanding  HOME EXERCISE PROGRAM: Access Code: [facility] - emailed URL: [email or web address] Date: 12/30/2023 Prepared by: Minor Amble Mauricia Mertens  Exercises - Standing Quadriceps Stretch  - 1 x daily - 7 x weekly - 1 sets - 2 reps - 20 hold - Modified Thomas Stretch  - 1 x daily - 7 x weekly - 1 sets - 2 reps - 20 hold - Standing Hamstring Stretch on Chair  - 1 x daily - 7 x weekly - 1 sets - 2 reps - 20 hold - Standing Forward Trunk Flexion  - 1 x daily - 7 x weekly - 1 sets - 2 reps - 20 hold - Seated Figure 4 Piriformis Stretch  - 1 x daily - 7 x weekly - 1 sets - 2 reps - 20 hold - Supine Figure 4 Piriformis Stretch  - 1 x daily - 7 x weekly - 1 sets - 2 reps - 20 hold - Gastroc Stretch on Wall  - 1 x daily - 7 x weekly - 1 sets - 2  reps - 20 hold - Squat with Chair Touch  - 1 x daily - 7 x weekly - 2 sets - 10 reps - Kettlebell Deadlift  - 1 x daily - 7 x weekly - 2 sets - 10 reps - Sidelying Hip Abduction  - 1 x daily - 7 x weekly - 2 sets - 10 reps - Standing Repeated Hip Abduction with Ankle Weight  - 1 x daily - 7 x weekly - 2 sets - 10 reps - Standing Hip Extension with Ankle Weight  - 1 x daily - 7 x weekly - 2 sets - 10 reps  ASSESSMENT:  CLINICAL  IMPRESSION: Pt arrived from [place] MD having had lab work drawn to check electrolyte balance.  She is on a specific diet of salt and protein only so she is urinating a lot and worries about her body fluid balance and impact on muscles.  She reports her Rt hip is feeling much improved but she is getting frequent twitches and sometimes full cramps of LE muscles from hips to feet.  She is very inflexible through anterior hip and thigh and has end range tightness of other LE muscle groups.  We revamped her HEP for LE stretching today and added some functional strength.  She expressed goals of getting LE stronger, be strong enough to rise from floor with proper technique, and get better at squats.  She reports her balance is off so incorporating some balance will be good as well.  Pt was pleased with her session and requested her HEP be emailed today.  OBJECTIVE IMPAIRMENTS: decreased balance, difficulty walking, decreased strength, increased muscle spasms, impaired flexibility, postural dysfunction, and pain.   ACTIVITY LIMITATIONS: lifting, bending, sitting, standing, squatting, sleeping, and stairs  PARTICIPATION LIMITATIONS: cleaning, laundry, community activity, and yard work  PERSONAL FACTORS: Time since onset of injury/illness/exacerbation and 3+ comorbidities: OA, Hx of right THA, Hx of breast cancer  are also affecting patient's functional outcome.   REHAB POTENTIAL: Good  CLINICAL DECISION MAKING: Evolving/moderate complexity  EVALUATION COMPLEXITY: Moderate   GOALS: Goals reviewed with patient? Yes  SHORT TERM GOALS: Target date: 01/09/2024 Patient will be independent with initial HEP. Baseline: Goal status: ongoing 4/22  2.  Patient will report at least a 25% improvement in right hip pain. Baseline:  Goal status: met 4/22   LONG TERM GOALS: Target date: 01/09/2024  Patient will be independent with advanced HEP to allow for self progression after discharge. Baseline:  Goal status:  ongoing  2.  Patient will improve Lower Extremity Functional Scale (LEFS) to at least 50% to demonstrate improvements in functional tasks. Baseline: 12.5% Goal status: INITIAL  3.  Patient will increase right hip strength to at least 4+ to 5-/5 to allow her to hike on various terrain and navigate stairs with improved ease. Baseline: 4/5 Goal status: INITIAL  4.  Patient will report ability to return to hiking for at least 2 miles without increased pain. Baseline:  Goal status: INITIAL  5. Pt will be able to demo floor to stand transfer in case of a fall.  Baseline: no knowledge  Goal status: NEW 4/22   PLAN:  PT FREQUENCY: 2x/week  PT DURATION: 8 weeks  PLANNED INTERVENTIONS: [id number]- PT Re-evaluation, 97110-Therapeutic exercises, 97530- Therapeutic activity, 97112- Neuromuscular re-education, 97535- Self Care, 24401- Manual therapy, [id number]- Gait training, [id number]- Canalith repositioning, [id number]- Aquatic Therapy, [id number]- Electrical stimulation (unattended), [id number]- Electrical stimulation (manual), [id number]- Vasopneumatic device, [id number]- Ultrasound, [id number]- Traction (mechanical), [id number]- Ionotophoresis 4mg /ml Dexamethasone, Patient/Family  education, Balance training, Stair training, Taping, Dry Needling, Joint mobilization, Joint manipulation, Spinal manipulation, Spinal mobilization, Vestibular training, Cryotherapy, and Moist heat  PLAN FOR NEXT SESSION: ERO, review HEP, LE flexibility and strength, functional strength, balance, floor transfer LTG  Raynell Caller, PT 12/30/23 11:21 AM  [facility] [address], [address] [place], [place] [address] Phone # [phone] Fax [phone]

## 2023-12-31 ENCOUNTER — Encounter

## 2024-01-05 ENCOUNTER — Encounter: Payer: Self-pay | Admitting: Physical Therapy

## 2024-01-05 ENCOUNTER — Ambulatory Visit: Admitting: Physical Therapy

## 2024-01-05 DIAGNOSIS — M25551 Pain in right hip: Secondary | ICD-10-CM

## 2024-01-05 DIAGNOSIS — R252 Cramp and spasm: Secondary | ICD-10-CM | POA: Diagnosis not present

## 2024-01-05 DIAGNOSIS — R262 Difficulty in walking, not elsewhere classified: Secondary | ICD-10-CM

## 2024-01-05 DIAGNOSIS — M6281 Muscle weakness (generalized): Secondary | ICD-10-CM | POA: Diagnosis not present

## 2024-01-05 NOTE — Therapy (Signed)
 OUTPATIENT PHYSICAL THERAPY LOWER EXTREMITY TREATMENT   Patient Name: Julie Turner MRN: [id number] DOB:Jul 09, 1956, 68 y.o., female Today's Date: 01/05/2024  END OF SESSION:  PT End of Session - 01/05/24 1232     Visit Number 6    Date for PT Re-Evaluation 02/16/24    Authorization Type UHC Medicare Dual    Progress Note Due on Visit 10    PT Start Time 1230    PT Stop Time 1315    PT Time Calculation (min) 45 min    Activity Tolerance Patient tolerated treatment well    Behavior During Therapy WFL for tasks assessed/performed               Past Medical History:  Diagnosis Date   Anxiety    Arthritis    "all my joints"   Breast cancer, right breast (HCC) 2001   S/P lumpecbomy and radiation   Depression    Fibromyalgia    GERD (gastroesophageal reflux disease)    High cholesterol    Hypertension    "just when I had radiation in 2001"   Migraine    "when I don't take my ASA qd" (08/15/2014)   Numbness and tingling in left hand    Spinal headache    Stroke (HCC) ~ 2013   "silent stroke" per her neurologist   Past Surgical History:  Procedure Laterality Date   BREAST LUMPECTOMY Right 09/10/1999   BREAST LUMPECTOMY Right    2nd time w/"all the lymph nodes under my arm"   BREAST RECONSTRUCTION Bilateral    CESAREAN SECTION  1987; 1988   CHOLECYSTECTOMY  09/10/1999   CORONARY ARTERY BYPASS GRAFT  06/2022   DILATION AND CURETTAGE OF UTERUS  09/10/1999   FOOT SURGERY Left ~ 2012   joint cleaned out   JOINT REPLACEMENT     TOTAL HIP ARTHROPLASTY Right 08/15/2014   TOTAL HIP ARTHROPLASTY Right 08/15/2014   Procedure: RIGHT TOTAL HIP ARTHROPLASTY;  Surgeon: Ilean Mall, MD;  Location: [facility] OR;  Service: Orthopedics;  Laterality: Right;   Patient Active Problem List   Diagnosis Date Noted   Palpitations 12/01/2023   PAD (peripheral artery disease) (HCC) 12/17/2022   Coronary artery disease of native artery of native heart with stable angina pectoris (HCC)  08/09/2022   Statin myopathy 08/09/2022   Mixed hyperlipidemia 08/09/2022   S/P CABG (coronary artery bypass graft) 08/09/2022   Primary hypertension 08/09/2022   Low back pain 11/22/2021   Leg cramps 01/18/2019   Decreased exercise tolerance 01/18/2019   Arthritis of right hip 08/15/2014    PCP: Mordechai April, DO  REFERRING PROVIDER: Wendolyn Hamburger, MD  REFERRING DIAG: M70.61 (ICD-10-CM) - Trochanteric bursitis, right hip  THERAPY DIAG:  Pain in right hip  Difficulty in walking, not elsewhere classified  Muscle weakness (generalized)  Cramp and spasm  Rationale for Evaluation and Treatment: Rehabilitation  ONSET DATE: "a couple of weeks, maybe 3"  SUBJECTIVE:   SUBJECTIVE STATEMENT: Pt knows blood work is back but is unsure of the results.   I am getting less cramps and twitching in all of my muscles.   I'm feeling less fragile physically.  I know I am weak.  PERTINENT HISTORY: Carnivore Diet Right THA in 2015, Hx of breast cancer, Right great toe pain, OA  PAIN:  12/10/23 Are you having pain? Yes: NPRS scale: 5/10 Pain location: anterior Rt thigh Pain description: spasms - less frequent and intense Aggravating factors: feels it when she wakes up and in bed  at night Relieving factors: unknown  PRECAUTIONS: None  RED FLAGS: None   WEIGHT BEARING RESTRICTIONS: No  FALLS:  Has patient fallen in last 6 months? 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  PATIENT GOALS: To be pain free and be more active to hike and exercise.  NEXT MD VISIT: As needed  OBJECTIVE:  Note: Objective measures were completed at Evaluation unless otherwise noted.  DIAGNOSTIC FINDINGS:  Pt reports that they did radiographs at the ortho office and patient was diagnosed  with bursitis.  PATIENT SURVEYS:  01/05/24: LEFS 46/80 = 57.5% Eval: LEFS 10 / 80 = 12.5 %  COGNITION: Overall cognitive status: Within functional limits for tasks assessed     SENSATION: Patient reports some numbness and tingling down her right leg   MUSCLE LENGTH: 01/05/24:  Eval: Hamstrings: tight at end range on Rt, bil quads tight Rt>Lt   POSTURE: rounded shoulders and forward head  PALPATION: Patient is tender to palpation along right hip  LOWER EXTREMITY ROM: WNL with exception of knee flexion secondary to bil quad tighness [facility] with some pain on right hip and right great toe  LOWER EXTREMITY MMT: 01/05/24:  Grossly weak bil hips 4-/5, knees 4/5   Eval: Left LE is WFL Right hip strength is grossly 4/5   FUNCTIONAL TESTS:  01/05/24: 5xSTS: 7.29 sec TUG: 6.91 sec  Eval: 5 times sit to stand: 13.74 sec Timed up and go (TUG): 9.65 sec  GAIT: Distance walked: >500 ft Assistive device utilized: None Level of assistance: Complete Independence Comments: Patient reports antalgic gait                                                                                                                                TODAY'S TREATMENT  01/05/24: ERO with LEFS, strength, ROM, flexibility, trigger point assessment measures (see above) Discussion about possible impact of her diet on her physical symptoms 5x STS and TUG Rt quad STM - Pt declined DN today - stripping, broadening, and massage gun to rectus femoris TP Verbal review of HEP - if nothing else, stretch quads  12/30/23: Long discussion about current diet, bloodwork done (pending electrolyte check), activity level, symptoms and goals in coming in for PT LE stretches: standing quad, modified Thomas test for hip flexors, standing HS foot on chair, runner stretch, fig 4 seated and supine (prefers supine) Squat to chair x 10 with PT demo and cues for technique Dead lift holding bil 3lb weights x10 (Pt has 3lb ankle  weights at home) SL hip abd x10 Standing counter hip ext and abd with ankle weights (Pt has ankle weights at home)  DATE: 12/17/2023 Brought patient back to a room per request from previous appts (aversion to noise) and  she feels our acoustics are poor and she is unable to hear us .   PT and patient began discussing her status.  She stated that the right leg did feel a little better after the needling but the tension in the leg came back and she still feels that the pain is still due to electrolyte imbalance.  We discussed that it would be unusual for only the right leg to be affected by an electrolyte imbalance.  She continued explaining that she just felt that there was something wrong that was missed.  PT inquired if she felt a referral to a neuro specialist would be helpful.  She went on to share that she does not have a lot of trust in the medical community and gave various examples of why she did not.  PT attempted to share different perspective but patient felt PT was "siding with" the medical community.  She became increasingly agitated, sharing various examples of where her trust was compromised.  She eventually stood and walked out of appt.     PATIENT EDUCATION:  Education details: [email or web address], Issued HEP and provided handouts for aquatic PT Person educated: Patient Education method: Explanation, Demonstration, and Handouts Education comprehension: verbalized understanding  HOME EXERCISE PROGRAM: Access Code: [facility] - emailed URL: [email or web address] Date: 12/30/2023 Prepared by: Minor Amble Kamarii Buren  Exercises - Standing Quadriceps Stretch  - 1 x daily - 7 x weekly - 1 sets - 2 reps - 20 hold - Modified Thomas Stretch  - 1 x daily - 7 x weekly - 1 sets - 2 reps - 20 hold - Standing Hamstring Stretch on Chair  - 1 x daily - 7 x weekly - 1 sets - 2 reps - 20 hold - Standing Forward Trunk Flexion  - 1 x daily - 7 x weekly - 1 sets - 2 reps - 20 hold - Seated Figure 4  Piriformis Stretch  - 1 x daily - 7 x weekly - 1 sets - 2 reps - 20 hold - Supine Figure 4 Piriformis Stretch  - 1 x daily - 7 x weekly - 1 sets - 2 reps - 20 hold - Gastroc Stretch on Wall  - 1 x daily - 7 x weekly - 1 sets - 2 reps - 20 hold - Squat with Chair Touch  - 1 x daily - 7 x weekly - 2 sets - 10 reps - Kettlebell Deadlift  - 1 x daily - 7 x weekly - 2 sets - 10 reps - Sidelying Hip Abduction  - 1 x daily - 7 x weekly - 2 sets - 10 reps - Standing Repeated Hip Abduction with Ankle Weight  - 1 x daily - 7 x weekly - 2 sets - 10 reps - Standing Hip Extension with Ankle Weight  - 1 x daily - 7 x weekly - 2 sets - 10 reps  ASSESSMENT:  CLINICAL IMPRESSION: Pt reports feeling less physically fragile.  Her LEFS score has improved significantly.  She has ongoing bil LE weakness surrounding bil hips.  She is getting less muscle cramps and muscle twitches lately.  She has presence of TP throughout the length of rectus femoris on Rt.  She opted for massage vs DN today. She will continue to benefit from skilled PT to address soft tissue dysfunction in Rt thigh and weakness in bil LE.  OBJECTIVE IMPAIRMENTS: decreased balance, difficulty walking, decreased strength, increased muscle spasms, impaired flexibility, postural dysfunction, and pain.   ACTIVITY LIMITATIONS: lifting, bending, sitting, standing,  squatting, sleeping, and stairs  PARTICIPATION LIMITATIONS: cleaning, laundry, community activity, and yard work  PERSONAL FACTORS: Time since onset of injury/illness/exacerbation and 3+ comorbidities: OA, Hx of right THA, Hx of breast cancer  are also affecting patient's functional outcome.   REHAB POTENTIAL: Good  CLINICAL DECISION MAKING: Evolving/moderate complexity  EVALUATION COMPLEXITY: Moderate   GOALS: Goals reviewed with patient? Yes  SHORT TERM GOALS: Target date: 01/09/2024 Patient will be independent with initial HEP. Baseline: Goal status: MET 4/28  2.  Patient will  report at least a 25% improvement in right hip pain. Baseline:  Goal status: met 4/22   LONG TERM GOALS: Target date: 01/09/2024  Patient will be independent with advanced HEP to allow for self progression after discharge. Baseline:  Goal status: ongoing  2.  Patient will improve Lower Extremity Functional Scale (LEFS) to at least 50% to demonstrate improvements in functional tasks. Baseline: 12.5% Goal status: met 4/28  3.  Patient will increase right hip strength to at least 4+ to 5-/5 to allow her to hike on various terrain and navigate stairs with improved ease. Baseline: 4/5 Goal status: ongoing 4/28  4.  Patient will report ability to return to hiking for at least 2 miles without increased pain. Baseline:  Goal status: ongoing 4/28 - hiking but with pain  5. Pt will be able to demo floor to stand transfer in case of a fall.  Baseline: no knowledge  Goal status: NEW 4/22   PLAN:  PT FREQUENCY: 1-2x/week  PT DURATION: 6 weeks  PLANNED INTERVENTIONS: 97164- PT Re-evaluation, 97110-Therapeutic exercises, 97530- Therapeutic activity, 97112- Neuromuscular re-education, 97535- Self Care, 66063- Manual therapy, [id number]- Gait training, [phone]- Canalith repositioning, [id number]- Aquatic Therapy, [id number]- Electrical stimulation (unattended), [phone]- Electrical stimulation (manual), [id number]- Vasopneumatic device, [id number]- Ultrasound, [id number]- Traction (mechanical), [id number]- Ionotophoresis 4mg /ml Dexamethasone, Patient/Family education, Balance training, Stair training, Taping, Dry Needling, Joint mobilization, Joint manipulation, Spinal manipulation, Spinal mobilization, Vestibular training, Cryotherapy, and Moist heat  PLAN FOR NEXT SESSION: did Pt consistently stretch Rt quad?, manual therapy to Rt quad with option to DN if Pt consents, build LE strength for bil hips  Raynell Caller, PT 01/05/24 1:26 PM   [facility] [address] [place], [place]  [address] Phone # [phone] Fax [phone]

## 2024-01-14 ENCOUNTER — Encounter: Admitting: Physical Therapy

## 2024-01-16 ENCOUNTER — Other Ambulatory Visit: Payer: Self-pay | Admitting: Cardiology

## 2024-01-16 DIAGNOSIS — G720 Drug-induced myopathy: Secondary | ICD-10-CM

## 2024-01-16 DIAGNOSIS — I2510 Atherosclerotic heart disease of native coronary artery without angina pectoris: Secondary | ICD-10-CM

## 2024-01-20 ENCOUNTER — Telehealth: Payer: Self-pay

## 2024-01-20 ENCOUNTER — Other Ambulatory Visit (HOSPITAL_COMMUNITY): Payer: Self-pay

## 2024-01-20 NOTE — Telephone Encounter (Signed)
 Pharmacy Patient Advocate Encounter  Received notification from [employer] that Prior Authorization for REPATHA  has been APPROVED from 01/20/24 to 07/22/24. Ran test claim, Copay is $0. This test claim was processed through [facility]- copay amounts may vary at other pharmacies due to pharmacy/plan contracts, or as the patient moves through the different stages of their insurance plan.

## 2024-01-20 NOTE — Telephone Encounter (Signed)
 Pharmacy Patient Advocate Encounter   Received notification from CoverMyMeds that prior authorization for REPATHA  is required/requested.   Insurance verification completed.   The patient is insured through [facility] .   Per test claim: PA required; PA submitted to above mentioned insurance via CoverMyMeds Key/confirmation #/EOC [id number] Status is pending

## 2024-01-22 ENCOUNTER — Encounter: Payer: Self-pay | Admitting: Physical Therapy

## 2024-01-22 ENCOUNTER — Ambulatory Visit: Attending: Orthopedic Surgery | Admitting: Physical Therapy

## 2024-01-22 DIAGNOSIS — M6281 Muscle weakness (generalized): Secondary | ICD-10-CM

## 2024-01-22 DIAGNOSIS — R252 Cramp and spasm: Secondary | ICD-10-CM

## 2024-01-22 DIAGNOSIS — M25551 Pain in right hip: Secondary | ICD-10-CM | POA: Diagnosis not present

## 2024-01-22 DIAGNOSIS — R262 Difficulty in walking, not elsewhere classified: Secondary | ICD-10-CM

## 2024-01-22 NOTE — Therapy (Signed)
 OUTPATIENT PHYSICAL THERAPY LOWER EXTREMITY TREATMENT   Patient Name: Julie Turner MRN: [id number] DOB:02-24-1956, 68 y.o., female Today's Date: 01/22/2024  END OF SESSION:  PT End of Session - 01/22/24 1105     Visit Number 7    Date for PT Re-Evaluation 02/16/24    Authorization Type UHC Medicare Dual    Progress Note Due on Visit 10    PT Start Time 1100    PT Stop Time 1147    PT Time Calculation (min) 47 min    Activity Tolerance Patient tolerated treatment well    Behavior During Therapy WFL for tasks assessed/performed                Past Medical History:  Diagnosis Date   Anxiety    Arthritis    "all my joints"   Breast cancer, right breast (HCC) 2001   S/P lumpecbomy and radiation   Depression    Fibromyalgia    GERD (gastroesophageal reflux disease)    High cholesterol    Hypertension    "just when I had radiation in 2001"   Migraine    "when I don't take my ASA qd" (08/15/2014)   Numbness and tingling in left hand    Spinal headache    Stroke (HCC) ~ 2013   "silent stroke" per her neurologist   Past Surgical History:  Procedure Laterality Date   BREAST LUMPECTOMY Right 09/10/1999   BREAST LUMPECTOMY Right    2nd time w/"all the lymph nodes under my arm"   BREAST RECONSTRUCTION Bilateral    CESAREAN SECTION  1987; 1988   CHOLECYSTECTOMY  09/10/1999   CORONARY ARTERY BYPASS GRAFT  06/2022   DILATION AND CURETTAGE OF UTERUS  09/10/1999   FOOT SURGERY Left ~ 2012   joint cleaned out   JOINT REPLACEMENT     TOTAL HIP ARTHROPLASTY Right 08/15/2014   TOTAL HIP ARTHROPLASTY Right 08/15/2014   Procedure: RIGHT TOTAL HIP ARTHROPLASTY;  Surgeon: Ilean Mall, MD;  Location: [facility] OR;  Service: Orthopedics;  Laterality: Right;   Patient Active Problem List   Diagnosis Date Noted   Palpitations 12/01/2023   PAD (peripheral artery disease) (HCC) 12/17/2022   Coronary artery disease of native artery of native heart with stable angina pectoris (HCC)  08/09/2022   Statin myopathy 08/09/2022   Mixed hyperlipidemia 08/09/2022   S/P CABG (coronary artery bypass graft) 08/09/2022   Primary hypertension 08/09/2022   Low back pain 11/22/2021   Leg cramps 01/18/2019   Decreased exercise tolerance 01/18/2019   Arthritis of right hip 08/15/2014    PCP: Mordechai April, DO  REFERRING PROVIDER: Wendolyn Hamburger, MD  REFERRING DIAG: M70.61 (ICD-10-CM) - Trochanteric bursitis, right hip  THERAPY DIAG:  Pain in right hip  Difficulty in walking, not elsewhere classified  Muscle weakness (generalized)  Cramp and spasm  Rationale for Evaluation and Treatment: Rehabilitation  ONSET DATE: "a couple of weeks, maybe 3"  SUBJECTIVE:   SUBJECTIVE STATEMENT: I am feeling better - I think it has just finally healed.  PERTINENT HISTORY: Carnivore Diet Right THA in 2015, Hx of breast cancer, Right great toe pain, OA  PAIN:  12/10/23 Are you having pain? Yes: NPRS scale: 5/10 Pain location: anterior Rt thigh Pain description: spasms - less frequent and intense Aggravating factors: feels it when she wakes up and in bed at night Relieving factors: unknown  PRECAUTIONS: None  RED FLAGS: None   WEIGHT BEARING RESTRICTIONS: No  FALLS:  Has patient fallen in last  6 months? 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  PATIENT GOALS: To be pain free and be more active to hike and exercise.  NEXT MD VISIT: As needed  OBJECTIVE:  Note: Objective measures were completed at Evaluation unless otherwise noted.  DIAGNOSTIC FINDINGS:  Pt reports that they did radiographs at the ortho office and patient was diagnosed with bursitis.  PATIENT SURVEYS:  01/05/24: LEFS 46/80 = 57.5% Eval: LEFS 10 / 80 = 12.5 %  COGNITION: Overall cognitive  status: Within functional limits for tasks assessed     SENSATION: Patient reports some numbness and tingling down her right leg   MUSCLE LENGTH: 01/05/24:  Eval: Hamstrings: tight at end range on Rt, bil quads tight Rt>Lt   POSTURE: rounded shoulders and forward head  PALPATION: Patient is tender to palpation along right hip  LOWER EXTREMITY ROM: WNL with exception of knee flexion secondary to bil quad tighness [facility] with some pain on right hip and right great toe  LOWER EXTREMITY MMT: 01/05/24:  Grossly weak bil hips 4-/5, knees 4/5   Eval: Left LE is WFL Right hip strength is grossly 4/5   FUNCTIONAL TESTS:  01/05/24: 5xSTS: 7.29 sec TUG: 6.91 sec  Eval: 5 times sit to stand: 13.74 sec Timed up and go (TUG): 9.65 sec  GAIT: Distance walked: >500 ft Assistive device utilized: None Level of assistance: Complete Independence Comments: Patient reports antalgic gait                                                                                                                                TODAY'S TREATMENT  01/22/24: Recumbent bike L2 x 5' PT present to discuss status Bil HS stretch foot on 2nd or 3rd step x30" Bil hip flexor stretch with OH reach x30" Sit to stand hold 10lb x10 Standing T at counter 10lb x10 each LE Side lunge/back lunge on slider at counter x8 each bil Squat with diagonal chop/lift x10 each 8lb March hip matrix 40lb x15 each Hip matrix hip ext 55lb x15 each 3lb bil UE weights - cross punches, upper cuts, lateral raises, OH press Updated HEP for UE: black band rows high anchor, green band shoulder ext and tricep press  01/05/24: ERO with LEFS, strength, ROM, flexibility, trigger point assessment measures (see above) Discussion about possible impact of her diet on her physical symptoms 5x STS and TUG Rt quad STM - Pt declined DN today - stripping, broadening, and massage gun to rectus femoris TP Verbal review of HEP - if nothing else, stretch  quads  12/30/23: Long discussion about current diet, bloodwork done (pending electrolyte check), activity level, symptoms and goals in coming in for PT LE stretches: standing quad, modified Thomas test for hip flexors, standing HS  foot on chair, runner stretch, fig 4 seated and supine (prefers supine) Squat to chair x 10 with PT demo and cues for technique Dead lift holding bil 3lb weights x10 (Pt has 3lb ankle weights at home) SL hip abd x10 Standing counter hip ext and abd with ankle weights (Pt has ankle weights at home)   PATIENT EDUCATION:  Education details: [email or web address], Issued HEP and provided handouts for aquatic PT Person educated: Patient Education method: Explanation, Demonstration, and Handouts Education comprehension: verbalized understanding  HOME EXERCISE PROGRAM: Access Code: [email or web address] URL: [email or web address] Date: 01/22/2024 Prepared by: Minor Amble Charnel Giles  Exercises - Standing Quadriceps Stretch  - 1 x daily - 7 x weekly - 1 sets - 2 reps - 20 hold - Modified Thomas Stretch  - 1 x daily - 7 x weekly - 1 sets - 2 reps - 20 hold - Standing Hamstring Stretch on Chair  - 1 x daily - 7 x weekly - 1 sets - 2 reps - 20 hold - Standing Forward Trunk Flexion  - 1 x daily - 7 x weekly - 1 sets - 2 reps - 20 hold - Seated Figure 4 Piriformis Stretch  - 1 x daily - 7 x weekly - 1 sets - 2 reps - 20 hold - Supine Figure 4 Piriformis Stretch  - 1 x daily - 7 x weekly - 1 sets - 2 reps - 20 hold - Gastroc Stretch on Wall  - 1 x daily - 7 x weekly - 1 sets - 2 reps - 20 hold - Squat with Chair Touch  - 1 x daily - 7 x weekly - 2 sets - 10 reps - Kettlebell Deadlift  - 1 x daily - 7 x weekly - 2 sets - 10 reps - Sidelying Hip Abduction  - 1 x daily - 7 x weekly - 2 sets - 10 reps - Standing Repeated Hip Abduction with Ankle Weight  - 1 x daily - 7 x weekly - 2 sets - 10 reps - Standing Hip Extension with Ankle Weight  - 1 x daily - 7 x weekly - 2 sets - 10 reps -  Standing High Row with Resistance  - 1 x daily - 7 x weekly - 2 sets - 10 reps - Shoulder Extension with Resistance - Palms Forward  - 1 x daily - 7 x weekly - 2 sets - 10 reps - Standing Tricep Extensions with Resistance  - 1 x daily - 7 x weekly - 2 sets - 10 reps  ASSESSMENT:  CLINICAL IMPRESSION: Pt reports improving Rt thigh pain feeling like she has had more time to heal.  She was able to perform global functional strengthening today with brief episode of dizziness with need to take a very short seated break.  Updated HEP for UE functional strength with core stabilization awareness.    OBJECTIVE IMPAIRMENTS: decreased balance, difficulty walking, decreased strength, increased muscle spasms, impaired flexibility, postural dysfunction, and pain.   ACTIVITY LIMITATIONS: lifting, bending, sitting, standing, squatting, sleeping, and stairs  PARTICIPATION LIMITATIONS: cleaning, laundry, community activity, and yard work  PERSONAL FACTORS: Time since onset of injury/illness/exacerbation and 3+ comorbidities: OA, Hx of right THA, Hx of breast cancer are also affecting patient's functional outcome.   REHAB POTENTIAL: Good  CLINICAL DECISION MAKING: Evolving/moderate complexity  EVALUATION COMPLEXITY: Moderate   GOALS: Goals reviewed with patient? Yes  SHORT TERM GOALS: Target date: 01/09/2024 Patient will be independent with initial HEP. Baseline: Goal status: MET 4/28  2.  Patient will report at least a 25% improvement in right hip pain. Baseline:  Goal status: met 4/22   LONG TERM GOALS: Target date: 01/09/2024  Patient will be independent with advanced HEP to allow for self progression after discharge. Baseline:  Goal status: ongoing  2.  Patient will improve Lower Extremity Functional Scale (LEFS) to at least 50% to demonstrate improvements in functional tasks. Baseline: 12.5% Goal status: met 4/28  3.  Patient will increase right hip strength to at least 4+ to 5-/5 to  allow her to hike on various terrain and navigate stairs with improved ease. Baseline: 4/5 Goal status: ongoing 4/28  4.  Patient will report ability to return to hiking for at least 2 miles without increased pain. Baseline:  Goal status: ongoing 4/28 - hiking but with pain  5. Pt will be able to demo floor to stand transfer in case of a fall.  Baseline: no knowledge  Goal status: NEW 4/22   PLAN:  PT FREQUENCY: 1-2x/week  PT DURATION: 6 weeks  PLANNED INTERVENTIONS: 97164- PT Re-evaluation, 97110-Therapeutic exercises, 97530- Therapeutic activity, 97112- Neuromuscular re-education, 97535- Self Care, 40981- Manual therapy, [phone]- Gait training, [phone]- Canalith repositioning, [id number]- Aquatic Therapy, [phone]- Electrical stimulation (unattended), [phone]- Electrical stimulation (manual), [id number]- Vasopneumatic device, [id number]- Ultrasound, [id number]- Traction (mechanical), [id number]- Ionotophoresis 4mg /ml Dexamethasone, Patient/Family education, Balance training, Stair training, Taping, Dry Needling, Joint mobilization, Joint manipulation, Spinal manipulation, Spinal mobilization, Vestibular training, Cryotherapy, and Moist heat  PLAN FOR NEXT SESSION: continue functional strength, stretching, manual techniques as needed for Rt thigh pain  Raynell Caller, PT 01/22/24 11:59 AM    [facility] [address], [address] [place], [place] [address] Phone # [phone] Fax [phone]

## 2024-01-29 ENCOUNTER — Other Ambulatory Visit (HOSPITAL_BASED_OUTPATIENT_CLINIC_OR_DEPARTMENT_OTHER): Payer: Self-pay | Admitting: Certified Nurse Midwife

## 2024-01-30 ENCOUNTER — Ambulatory Visit: Admitting: Physical Therapy

## 2024-01-30 ENCOUNTER — Encounter: Payer: Self-pay | Admitting: Physical Therapy

## 2024-01-30 DIAGNOSIS — R262 Difficulty in walking, not elsewhere classified: Secondary | ICD-10-CM

## 2024-01-30 DIAGNOSIS — R252 Cramp and spasm: Secondary | ICD-10-CM

## 2024-01-30 DIAGNOSIS — M25551 Pain in right hip: Secondary | ICD-10-CM

## 2024-01-30 DIAGNOSIS — M6281 Muscle weakness (generalized): Secondary | ICD-10-CM

## 2024-01-30 NOTE — Therapy (Signed)
 OUTPATIENT PHYSICAL THERAPY LOWER EXTREMITY TREATMENT   Patient Name: Julie Turner MRN: [id number] DOB:13-Oct-1955, 68 y.o., female Today's Date: 01/30/2024  END OF SESSION:  PT End of Session - 01/30/24 1018     Visit Number 8    Date for PT Re-Evaluation 02/16/24    Authorization Type [employer] Medicare Dual    Progress Note Due on Visit 10    PT Start Time 1016    PT Stop Time 1100    PT Time Calculation (min) 44 min    Activity Tolerance Patient tolerated treatment well    Behavior During Therapy WFL for tasks assessed/performed                Past Medical History:  Diagnosis Date   Anxiety    Arthritis    "all my joints"   Breast cancer, right breast (HCC) 2001   S/P lumpecbomy and radiation   Depression    Fibromyalgia    GERD (gastroesophageal reflux disease)    High cholesterol    Hypertension    "just when I had radiation in 2001"   Migraine    "when I don't take my ASA qd" (08/15/2014)   Numbness and tingling in left hand    Spinal headache    Stroke (HCC) ~ 2013   "silent stroke" per her neurologist   Past Surgical History:  Procedure Laterality Date   BREAST LUMPECTOMY Right 09/10/1999   BREAST LUMPECTOMY Right    2nd time w/"all the lymph nodes under my arm"   BREAST RECONSTRUCTION Bilateral    CESAREAN SECTION  1987; 1988   CHOLECYSTECTOMY  09/10/1999   CORONARY ARTERY BYPASS GRAFT  06/2022   DILATION AND CURETTAGE OF UTERUS  09/10/1999   FOOT SURGERY Left ~ 2012   joint cleaned out   JOINT REPLACEMENT     TOTAL HIP ARTHROPLASTY Right 08/15/2014   TOTAL HIP ARTHROPLASTY Right 08/15/2014   Procedure: RIGHT TOTAL HIP ARTHROPLASTY;  Surgeon: Ilean Mall, MD;  Location: [facility] OR;  Service: Orthopedics;  Laterality: Right;   Patient Active Problem List   Diagnosis Date Noted   Palpitations 12/01/2023   PAD (peripheral artery disease) (HCC) 12/17/2022   Coronary artery disease of native artery of native heart with stable angina pectoris (HCC)  08/09/2022   Statin myopathy 08/09/2022   Mixed hyperlipidemia 08/09/2022   S/P CABG (coronary artery bypass graft) 08/09/2022   Primary hypertension 08/09/2022   Low back pain 11/22/2021   Leg cramps 01/18/2019   Decreased exercise tolerance 01/18/2019   Arthritis of right hip 08/15/2014    PCP: Mordechai April, DO  REFERRING PROVIDER: Wendolyn Hamburger, MD  REFERRING DIAG: M70.61 (ICD-10-CM) - Trochanteric bursitis, right hip  THERAPY DIAG:  Pain in right hip  Difficulty in walking, not elsewhere classified  Muscle weakness (generalized)  Cramp and spasm  Rationale for Evaluation and Treatment: Rehabilitation  ONSET DATE: "a couple of weeks, maybe 3"  SUBJECTIVE:   SUBJECTIVE STATEMENT: I was pretty sore after last time - for 2-3 days. I would like to focus on doing exercises I can do at home.  I get cramping in my Rt leg when I walk for exercise.  PERTINENT HISTORY: Carnivore Diet Right THA in 2015, Hx of breast cancer, Right great toe pain, OA  PAIN:  12/10/23 Are you having pain? Yes: NPRS scale: 5/10 Pain location: anterior Rt thigh Pain description: spasms - less frequent and intense Aggravating factors: feels it when she wakes up and in bed at  night Relieving factors: unknown  PRECAUTIONS: None  RED FLAGS: None   WEIGHT BEARING RESTRICTIONS: No  FALLS:  Has patient fallen in last 6 months? 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  PATIENT GOALS: To be pain free and be more active to hike and exercise.  NEXT MD VISIT: As needed  OBJECTIVE:  Note: Objective measures were completed at Evaluation unless otherwise noted.  DIAGNOSTIC FINDINGS:  Pt reports that they did radiographs at the ortho office and patient was diagnosed with  bursitis.  PATIENT SURVEYS:  01/05/24: LEFS 46/80 = 57.5% Eval: LEFS 10 / 80 = 12.5 %  COGNITION: Overall cognitive status: Within functional limits for tasks assessed     SENSATION: Patient reports some numbness and tingling down her right leg   MUSCLE LENGTH: 01/05/24:  Eval: Hamstrings: tight at end range on Rt, bil quads tight Rt>Lt   POSTURE: rounded shoulders and forward head  PALPATION: Patient is tender to palpation along right hip  LOWER EXTREMITY ROM: WNL with exception of knee flexion secondary to bil quad tighness [facility] with some pain on right hip and right great toe  LOWER EXTREMITY MMT: 01/05/24:  Grossly weak bil hips 4-/5, knees 4/5   Eval: Left LE is WFL Right hip strength is grossly 4/5   FUNCTIONAL TESTS:  01/05/24: 5xSTS: 7.29 sec TUG: 6.91 sec  Eval: 5 times sit to stand: 13.74 sec Timed up and go (TUG): 9.65 sec  GAIT: Distance walked: >500 ft Assistive device utilized: None Level of assistance: Complete Independence Comments: Patient reports antalgic gait                                                                                                                                TODAY'S TREATMENT  01/30/24 Recumbent bike L3 x 5' PT present to discuss status Seated HS stretch x30" each side Fwd step up 6" march tap 3rd step x12 each light rail use 2x10 Deadlifts 10lb x15 Seated 3-way UE raise 3lb dumbbells x10 rounds - sit tall for core cure Iron cross stretch on table 2x20" bil Open book x10 each way Attempted supine dead bug isometric, heel taps but Pt not connecting with core Quadruped TA indraw x8 reps Bird dog x10 - good exercise for Pt Standing cross punches and fwd punches 3lb alt UE x20 each Seated thoracic SB and Rot 2x20" Updated HEP for stretches  01/22/24: Recumbent bike L2 x 5' PT present to discuss status Bil HS stretch foot on 2nd or 3rd step x30" Bil hip flexor stretch with OH reach x30" Sit to stand hold 10lb  x10 Standing T at counter 10lb x10 each LE Side lunge/back lunge on slider at counter x8 each bil Squat with diagonal chop/lift x10 each 8lb  March hip matrix 40lb x15 each Hip matrix hip ext 55lb x15 each 3lb bil UE weights - cross punches, upper cuts, lateral raises, OH press Updated HEP for UE: black band rows high anchor, green band shoulder ext and tricep press  01/05/24: ERO with LEFS, strength, ROM, flexibility, trigger point assessment measures (see above) Discussion about possible impact of her diet on her physical symptoms 5x STS and TUG Rt quad STM - Pt declined DN today - stripping, broadening, and massage gun to rectus femoris TP Verbal review of HEP - if nothing else, stretch quads  PATIENT EDUCATION:  Education details: [email or web address], Issued HEP and provided handouts for aquatic PT Person educated: Patient Education method: Explanation, Demonstration, and Handouts Education comprehension: verbalized understanding  HOME EXERCISE PROGRAM: Access Code: [email or web address] URL: [email or web address] Date: 01/30/2024 Prepared by: Minor Amble Celedonio Sortino  Exercises - Standing Quadriceps Stretch  - 1 x daily - 7 x weekly - 1 sets - 2 reps - 20 hold - Modified Thomas Stretch  - 1 x daily - 7 x weekly - 1 sets - 2 reps - 20 hold - Standing Hamstring Stretch on Chair  - 1 x daily - 7 x weekly - 1 sets - 2 reps - 20 hold - Standing Forward Trunk Flexion  - 1 x daily - 7 x weekly - 1 sets - 2 reps - 20 hold - Seated Figure 4 Piriformis Stretch  - 1 x daily - 7 x weekly - 1 sets - 2 reps - 20 hold - Supine Figure 4 Piriformis Stretch  - 1 x daily - 7 x weekly - 1 sets - 2 reps - 20 hold - Gastroc Stretch on Wall  - 1 x daily - 7 x weekly - 1 sets - 2 reps - 20 hold - Squat with Chair Touch  - 1 x daily - 7 x weekly - 2 sets - 10 reps - Kettlebell Deadlift  - 1 x daily - 7 x weekly - 2 sets - 10 reps - Sidelying Hip Abduction  - 1 x daily - 7 x weekly - 2 sets - 10 reps - Standing  Repeated Hip Abduction with Ankle Weight  - 1 x daily - 7 x weekly - 2 sets - 10 reps - Standing Hip Extension with Ankle Weight  - 1 x daily - 7 x weekly - 2 sets - 10 reps - Standing High Row with Resistance  - 1 x daily - 7 x weekly - 2 sets - 10 reps - Shoulder Extension with Resistance - Palms Forward  - 1 x daily - 7 x weekly - 2 sets - 10 reps - Standing Tricep Extensions with Resistance  - 1 x daily - 7 x weekly - 2 sets - 10 reps - Supine Straight Leg Lumbar Rotation Stretch  - 1 x daily - 7 x weekly - 1 sets - 3 reps - 30 hold - Sidelying Open Book Thoracic Lumbar Rotation and Extension  - 1 x daily - 7 x weekly - 1 sets - 3 reps - 30 hold - Seated Lumbar Flexion Stretch  - 1 x daily - 7 x weekly - 1 sets - 3 reps - 30 hold - Seated Sidebending Arms Overhead  - 1 x daily - 7 x weekly - 1 sets - 3 reps - 30 hold - Seated Trunk Rotation - Arms Crossed  - 1 x daily - 7 x weekly - 1 sets - 10 reps  ASSESSMENT:  CLINICAL IMPRESSION: Pt  was sore 2-3 days after last session.  She continues to get cramping in Rt LE during exercise walks.  She continues to express concern about cramping throughout various muscle groups including her hands.  She was able to perform full body circuit today and add trunk stretches which felt helpful so we added these to HEP.  She has coordination difficulty with core exercise and connecting cues to her core and PT spent time using various positions and movements to engage core with more Pt confidence.  Pt needed cues to slow down her reps to allow time for core to anchor with overlay of dumbbell movements today.  OBJECTIVE IMPAIRMENTS: decreased balance, difficulty walking, decreased strength, increased muscle spasms, impaired flexibility, postural dysfunction, and pain.   ACTIVITY LIMITATIONS: lifting, bending, sitting, standing, squatting, sleeping, and stairs  PARTICIPATION LIMITATIONS: cleaning, laundry, community activity, and yard work  PERSONAL FACTORS:  Time since onset of injury/illness/exacerbation and 3+ comorbidities: OA, Hx of right THA, Hx of breast cancer are also affecting patient's functional outcome.   REHAB POTENTIAL: Good  CLINICAL DECISION MAKING: Evolving/moderate complexity  EVALUATION COMPLEXITY: Moderate   GOALS: Goals reviewed with patient? Yes  SHORT TERM GOALS: Target date: 01/09/2024 Patient will be independent with initial HEP. Baseline: Goal status: MET 4/28  2.  Patient will report at least a 25% improvement in right hip pain. Baseline:  Goal status: met 4/22   LONG TERM GOALS: Target date: 01/09/2024  Patient will be independent with advanced HEP to allow for self progression after discharge. Baseline:  Goal status: ongoing  2.  Patient will improve Lower Extremity Functional Scale (LEFS) to at least 50% to demonstrate improvements in functional tasks. Baseline: 12.5% Goal status: met 4/28  3.  Patient will increase right hip strength to at least 4+ to 5-/5 to allow her to hike on various terrain and navigate stairs with improved ease. Baseline: 4/5 Goal status: ongoing 4/28  4.  Patient will report ability to return to hiking for at least 2 miles without increased pain. Baseline:  Goal status: ongoing 4/28 - hiking but with pain  5. Pt will be able to demo floor to stand transfer in case of a fall.  Baseline: no knowledge  Goal status: NEW 4/22   PLAN:  PT FREQUENCY: 1-2x/week  PT DURATION: 6 weeks  PLANNED INTERVENTIONS: 97164- PT Re-evaluation, 97110-Therapeutic exercises, 97530- Therapeutic activity, 97112- Neuromuscular re-education, 97535- Self Care, 16109- Manual therapy, [phone]- Gait training, [phone]- Canalith repositioning, [id number]- Aquatic Therapy, [phone]- Electrical stimulation (unattended), [phone]- Electrical stimulation (manual), [id number]- Vasopneumatic device, [id number]- Ultrasound, [id number]- Traction (mechanical), [id number]- Ionotophoresis 4mg /ml Dexamethasone, Patient/Family education, Balance  training, Stair training, Taping, Dry Needling, Joint mobilization, Joint manipulation, Spinal manipulation, Spinal mobilization, Vestibular training, Cryotherapy, and Moist heat  PLAN FOR NEXT SESSION: add bird dog to HEP, continue functional strength, stretching, manual techniques as needed for Rt thigh pain  Raynell Caller, PT 01/30/24 11:58 AM     [facility] [address], [address] [place], [place] [address] Phone # [phone] Fax [phone]

## 2024-02-06 ENCOUNTER — Encounter: Admitting: Physical Therapy

## 2024-02-17 ENCOUNTER — Ambulatory Visit (HOSPITAL_BASED_OUTPATIENT_CLINIC_OR_DEPARTMENT_OTHER): Admitting: Certified Nurse Midwife

## 2024-02-17 ENCOUNTER — Encounter (HOSPITAL_BASED_OUTPATIENT_CLINIC_OR_DEPARTMENT_OTHER): Payer: Self-pay | Admitting: Certified Nurse Midwife

## 2024-02-17 VITALS — BP 119/63 | HR 75 | Ht 66.0 in | Wt 183.2 lb

## 2024-02-17 DIAGNOSIS — R4189 Other symptoms and signs involving cognitive functions and awareness: Secondary | ICD-10-CM

## 2024-02-17 DIAGNOSIS — R350 Frequency of micturition: Secondary | ICD-10-CM

## 2024-02-17 DIAGNOSIS — R5383 Other fatigue: Secondary | ICD-10-CM | POA: Diagnosis not present

## 2024-02-17 NOTE — Progress Notes (Signed)
 68 y.o. No obstetric history on file. Single White or Caucasian female here for new gyn visit.  Per her visit with Dr. Mordechai April in May 2025 " She notes that she feels very unwell, she feels anxiety and feels panic. She notes she has had a panic attack in the past.  She has previously been on Cymbalta  and did feel a benefit when she was on it. She notes that when she came off of it after 12 years she did feel some anxiety but lately her symptoms have been worse.  She does feel that there is something organic causing her symptoms of anxiety, depression, and brain fog.  We discussed and reviewed the labs from our recent office visit.".  Medical Hx includes Hx Breast Cancer with Lumpectomy. Pt states she is here for "hormone testing" because she just "does not feel right" over the past few months.  Pt states last pap was negative/normal and 2 years ago (per guidelines repeat pap smear not indicated per pt report.). Pt states mammograms UTD.   States she was diagnosed with depression and SSRI was prescribed but she has not started yet. States these feelings that she is experiencing remind her of her postpartum period. She experienced menopause years ago. No hot flashes or night sweats. She denies vaginal complaints. Patient requests that we check her hormones.   No LMP recorded. Patient is postmenopausal.          Sexually active: No.    reports that she has quit smoking. Her smoking use included cigarettes. She started smoking about 37 years ago. She has a 18 pack-year smoking history. She has never used smokeless tobacco. She reports that she does not currently use alcohol. She reports that she does not use drugs.  Past Medical History:  Diagnosis Date   Anxiety    Arthritis    "all my joints"   Breast cancer, right breast (HCC) 2001   S/P lumpecbomy and radiation   Depression    Fibromyalgia    GERD (gastroesophageal reflux disease)    High cholesterol    Hypertension    "just when I had  radiation in 2001"   Migraine    "when I don't take my ASA qd" (08/15/2014)   Numbness and tingling in left hand    Spinal headache    Stroke [facility]) ~ 2013   "silent stroke" per her neurologist    Past Surgical History:  Procedure Laterality Date   BREAST LUMPECTOMY Right 09/10/1999   BREAST LUMPECTOMY Right    2nd time w/"all the lymph nodes under my arm"   BREAST RECONSTRUCTION Bilateral    CESAREAN SECTION  1987; 1988   CHOLECYSTECTOMY  09/10/1999   CORONARY ARTERY BYPASS GRAFT  06/2022   DILATION AND CURETTAGE OF UTERUS  09/10/1999   FOOT SURGERY Left ~ 2012   joint cleaned out   JOINT REPLACEMENT     TOTAL HIP ARTHROPLASTY Right 08/15/2014   TOTAL HIP ARTHROPLASTY Right 08/15/2014   Procedure: RIGHT TOTAL HIP ARTHROPLASTY;  Surgeon: Ilean Mall, MD;  Location: [facility] OR;  Service: Orthopedics;  Laterality: Right;    Current Outpatient Medications  Medication Sig Dispense Refill   ALPRAZolam  (XANAX ) 1 MG tablet Take 1 mg by mouth as needed for sleep.      CEQUA 0.09 % SOLN Apply 1 drop to eye 2 (two) times daily.     Cholecalciferol (VITAMIN D ) 125 MCG (5000 UT) CAPS Take by mouth.     Coenzyme Q10  50 MG CAPS as directed Orally     Evolocumab  (REPATHA  SURECLICK) 140 MG/ML SOAJ INJECT 140 MG INTO THE SKIN EVERY 14 DAYS 6 mL 3   hydrochlorothiazide  (HYDRODIURIL ) 25 MG tablet Take 1 tablet (25 mg total) by mouth daily. 90 tablet 3   losartan  (COZAAR ) 50 MG tablet Take 1 tablet (50 mg total) by mouth daily. 90 tablet 3   magnesium  gluconate (MAGONATE) 500 MG tablet Take 500 mg by mouth 2 (two) times daily.     metoprolol  succinate (TOPROL -XL) 100 MG 24 hr tablet Take 1 tablet (100 mg total) by mouth daily. 90 tablet 3   Misc Natural Products (BEET ROOT PO) Take by mouth.     tiZANidine  (ZANAFLEX ) 4 MG tablet Take 4 mg by mouth 3 (three) times daily as needed.     aspirin  EC 81 MG tablet Take 1 tablet (81 mg total) by mouth daily. (Patient not taking: Reported on 02/17/2024) 90  tablet 3   diclofenac (VOLTAREN) 75 MG EC tablet Take 75 mg by mouth 2 (two) times daily. (Patient not taking: Reported on 02/17/2024)     No current facility-administered medications for this visit.    Family History  Problem Relation Age of Onset   Hypertension Father     ROS: Constitutional: No fever, chills. No vaginal spotting or bleeding. Some urinary frequency.  Genitourinary:+urinary frequency  Exam:   BP 119/63 (BP Location: Left Arm, Patient Position: Sitting, Cuff Size: Large)   Pulse 75   Ht 5\' 6"  (1.676 m)   Wt 183 lb 3.2 oz (83.1 kg)   BMI 29.57 kg/m   Height: 5\' 6"  (167.6 cm) General appearance: alert, cooperative and appears stated age Head: Normocephalic, without obvious abnormality, atraumatic Neck: no adenopathy, supple, symmetrical, trachea midline and thyroid  normal to inspection and palpation Heart: regular rate and rhythm Abdomen: soft, non-tender; bowel sounds normal; no masses,  no organomegaly   Assessment/Plan: Depression/Anxiety - pt requests hormone testing. Will agree to hormone testing but patient educated that she is not a candidate for estrogen or testosterone due to hx of Breast Cancer. Discussed that symptoms not likely but could be related to menopause (menopause was years ago). She is instructed to consider a follow-up with [facility] or [facility], [facility]. Pap smear UTD and not due at this time.  US  reviewed to rule out ovarian abnormalities due to pt c/o urinary frequency with no other s/s urinary tract infection.   Yolanda Hence

## 2024-02-21 LAB — THYROID PANEL WITH TSH
Free Thyroxine Index: 1.8 (ref 1.2–4.9)
T3 Uptake Ratio: 23 % — ABNORMAL LOW (ref 24–39)
T4, Total: 7.9 ug/dL (ref 4.5–12.0)
TSH: 6.07 u[IU]/mL — ABNORMAL HIGH (ref 0.450–4.500)

## 2024-02-21 LAB — TESTT+TESTF+SHBG
Sex Hormone Binding: 20.9 nmol/L (ref 17.3–125.0)
Testosterone, Free: 2.5 pg/mL (ref 0.0–4.2)
Testosterone, Total, LC/MS: 46.5 ng/dL — ABNORMAL HIGH (ref 7.0–40.0)

## 2024-02-21 LAB — PROGESTERONE: Progesterone: 0.2 ng/mL

## 2024-02-21 LAB — ESTRADIOL: Estradiol: 7.9 pg/mL (ref 0.0–54.7)

## 2024-03-01 ENCOUNTER — Ambulatory Visit (HOSPITAL_BASED_OUTPATIENT_CLINIC_OR_DEPARTMENT_OTHER): Payer: Self-pay | Admitting: Certified Nurse Midwife

## 2024-03-03 DIAGNOSIS — H25813 Combined forms of age-related cataract, bilateral: Secondary | ICD-10-CM | POA: Diagnosis not present

## 2024-03-03 DIAGNOSIS — D3132 Benign neoplasm of left choroid: Secondary | ICD-10-CM | POA: Diagnosis not present

## 2024-03-03 DIAGNOSIS — H1045 Other chronic allergic conjunctivitis: Secondary | ICD-10-CM | POA: Diagnosis not present

## 2024-03-03 DIAGNOSIS — D3131 Benign neoplasm of right choroid: Secondary | ICD-10-CM | POA: Diagnosis not present

## 2024-03-03 DIAGNOSIS — H0288B Meibomian gland dysfunction left eye, upper and lower eyelids: Secondary | ICD-10-CM | POA: Diagnosis not present

## 2024-03-03 DIAGNOSIS — H0288A Meibomian gland dysfunction right eye, upper and lower eyelids: Secondary | ICD-10-CM | POA: Diagnosis not present

## 2024-03-04 ENCOUNTER — Ambulatory Visit: Attending: Orthopedic Surgery | Admitting: Physical Therapy

## 2024-03-04 ENCOUNTER — Encounter: Payer: Self-pay | Admitting: Physical Therapy

## 2024-03-04 DIAGNOSIS — R252 Cramp and spasm: Secondary | ICD-10-CM | POA: Diagnosis not present

## 2024-03-04 DIAGNOSIS — R262 Difficulty in walking, not elsewhere classified: Secondary | ICD-10-CM | POA: Insufficient documentation

## 2024-03-04 DIAGNOSIS — M6281 Muscle weakness (generalized): Secondary | ICD-10-CM | POA: Diagnosis not present

## 2024-03-04 DIAGNOSIS — M25551 Pain in right hip: Secondary | ICD-10-CM | POA: Insufficient documentation

## 2024-03-04 NOTE — Therapy (Signed)
 OUTPATIENT PHYSICAL THERAPY LOWER EXTREMITY RE-EVALUATION  Progress Note Reporting Period 11/18/23 to 03/04/24  See note below for Objective Data and Assessment of Progress/Goals.       Patient Name: Julie Turner MRN: [id number] DOB:26-Apr-1956, 68 y.o., female Today's Date: 03/04/2024  END OF SESSION:  PT End of Session - 03/04/24 1534     Visit Number 9    Date for PT Re-Evaluation 04/29/24    Authorization Type [employer] Medicare Dual    Progress Note Due on Visit 19    PT Start Time 1535    PT Stop Time 1615    PT Time Calculation (min) 40 min    Activity Tolerance Patient tolerated treatment well    Behavior During Therapy WFL for tasks assessed/performed              Past Medical History:  Diagnosis Date   Anxiety    Arthritis    all my joints   Breast cancer, right breast (HCC) 2001   S/P lumpecbomy and radiation   Depression    Fibromyalgia    GERD (gastroesophageal reflux disease)    High cholesterol    Hypertension    just when I had radiation in 2001   Migraine    when I don't take my ASA qd (08/15/2014)   Numbness and tingling in left hand    Spinal headache    Stroke (HCC) ~ 2013   silent stroke per her neurologist   Past Surgical History:  Procedure Laterality Date   BREAST LUMPECTOMY Right 09/10/1999   BREAST LUMPECTOMY Right    2nd time w/all the lymph nodes under my arm   BREAST RECONSTRUCTION Bilateral    CESAREAN SECTION  1987; 1988   CHOLECYSTECTOMY  09/10/1999   CORONARY ARTERY BYPASS GRAFT  06/2022   DILATION AND CURETTAGE OF UTERUS  09/10/1999   FOOT SURGERY Left ~ 2012   joint cleaned out   JOINT REPLACEMENT     TOTAL HIP ARTHROPLASTY Right 08/15/2014   TOTAL HIP ARTHROPLASTY Right 08/15/2014   Procedure: RIGHT TOTAL HIP ARTHROPLASTY;  Surgeon: Dempsey JINNY Sensor, MD;  Location: [facility] OR;  Service: Orthopedics;  Laterality: Right;   Patient Active Problem List   Diagnosis Date Noted   Other fatigue 02/17/2024   Urinary  frequency 02/17/2024   Brain fog 02/17/2024   Palpitations 12/01/2023   PAD (peripheral artery disease) (HCC) 12/17/2022   Coronary artery disease of native artery of native heart with stable angina pectoris (HCC) 08/09/2022   Statin myopathy 08/09/2022   Mixed hyperlipidemia 08/09/2022   S/P CABG (coronary artery bypass graft) 08/09/2022   Primary hypertension 08/09/2022   Low back pain 11/22/2021   Leg cramps 01/18/2019   Decreased exercise tolerance 01/18/2019   Arthritis of right hip 08/15/2014    PCP: Dayna Motto, DO  REFERRING PROVIDER: Sensor Dempsey, MD  REFERRING DIAG: M70.61 (ICD-10-CM) - Trochanteric bursitis, right hip  THERAPY DIAG:  Pain in right hip  Difficulty in walking, not elsewhere classified  Muscle weakness (generalized)  Cramp and spasm  Rationale for Evaluation and Treatment: Rehabilitation  ONSET DATE: a couple of weeks, maybe 3  SUBJECTIVE:   SUBJECTIVE STATEMENT: I am taking supplements of sodium, magnesium  and potassium which is nearly eliminating my cramps all over my body. I think the initial Rt hip pain I had was due to my need to fight my LE cramps and since those are improving my Rt hip pain has improved.   I feel like I need  to work on my balance and hip stability - when I am doing chores, getting up, turning corners I feel off balance.  PERTINENT HISTORY: Carnivore Diet Right THA in 2015, Hx of breast cancer, Right great toe pain, OA  PAIN:  12/10/23 Are you having pain? Yes: NPRS scale: 3/10 Pain location: anterior Rt thigh Pain description: spasms - less frequent and intense Aggravating factors: feels it when she wakes up and in bed at night Relieving factors: unknown  PRECAUTIONS: None  RED FLAGS: None   WEIGHT BEARING RESTRICTIONS: No  FALLS:  Has patient fallen in last 6 months? No  LIVING ENVIRONMENT: Lives with: lives with their son Lives in: House/apartment Stairs: 2 level home Has following equipment at  home: None  OCCUPATION: Retired  PLOF: Independent and Leisure: hiking, cooking, Clinical cytogeneticist.  Has a treadmill a home.  PATIENT GOALS: To be pain free and be more active to hike and exercise.  NEXT MD VISIT: As needed  OBJECTIVE:  Note: Objective measures were completed at Evaluation unless otherwise noted.  DIAGNOSTIC FINDINGS:  Pt reports that they did radiographs at the ortho office and patient was diagnosed with bursitis.  PATIENT SURVEYS:  03/04/24: LEFS 45/80 = 56.25% 01/05/24: LEFS 46/80 = 57.5% Eval: LEFS 10 / 80 = 12.5 %  COGNITION: Overall cognitive status: Within functional limits for tasks assessed     SENSATION: Patient reports some numbness and tingling down her right leg   MUSCLE LENGTH: 03/04/24: limited bil ITB, bil piriformis Eval: Hamstrings: tight at end range on Rt, bil quads tight Rt>Lt   POSTURE: rounded shoulders and forward head  PALPATION: Patient is tender to palpation along right hip  LOWER EXTREMITY ROM: 03/04/24:  Eval: WNL with exception of knee flexion secondary to bil quad tighness WFL with some pain on right hip and right great toe  LOWER EXTREMITY MMT: 03/04/24: Rt hip ER 4-/5 Rt hip abd 4/5 Rt hip ext 4/5 Lt hip 5/5  01/05/24: Grossly weak bil hips 4-/5, knees 4/5   Eval: Left LE is WFL Right hip strength is grossly 4/5   FUNCTIONAL TESTS:  01/05/24: 5xSTS: 7.29 sec TUG: 6.91 sec  Eval: 5 times sit to stand: 13.74 sec Timed up and go (TUG): 9.65 sec  GAIT: Distance walked: >500 ft Assistive device utilized: None Level of assistance: Complete Independence Comments: Patient reports antalgic gait                                                                                                                                TODAY'S TREATMENT  03/04/24 ERO - LEFS, ROM, MMT, flexibility Supine PPT 5x5 Supine bridge with hip abd red loop x10x5 Supine ITB stretch with strap 30 bil SL clam red loop band x10 each  LE Bird dog 5 rounds 5 holds  Sidestepping x5 steps Lt/Rt red loop with 5 squats each lap x 5 laps VC and demo needed for squat technique  01/30/24  Recumbent bike L3 x 5' PT present to discuss status Seated HS stretch x30 each side Fwd step up 6 march tap 3rd step x12 each light rail use 2x10 Deadlifts 10lb x15 Seated 3-way UE raise 3lb dumbbells x10 rounds - sit tall for core cure Iron cross stretch on table 2x20 bil Open book x10 each way Attempted supine dead bug isometric, heel taps but Pt not connecting with core Quadruped TA indraw x8 reps Bird dog x10 - good exercise for Pt Standing cross punches and fwd punches 3lb alt UE x20 each Seated thoracic SB and Rot 2x20 Updated HEP for stretches  01/22/24: Recumbent bike L2 x 5' PT present to discuss status Bil HS stretch foot on 2nd or 3rd step x30 Bil hip flexor stretch with OH reach x30 Sit to stand hold 10lb x10 Standing T at counter 10lb x10 each LE Side lunge/back lunge on slider at counter x8 each bil Squat with diagonal chop/lift x10 each 8lb March hip matrix 40lb x15 each Hip matrix hip ext 55lb x15 each 3lb bil UE weights - cross punches, upper cuts, lateral raises, OH press Updated HEP for UE: black band rows high anchor, green band shoulder ext and tricep press   PATIENT EDUCATION:  Education details: [email or web address], Issued HEP and provided handouts for aquatic PT Person educated: Patient Education method: Explanation, Demonstration, and Handouts Education comprehension: verbalized understanding  HOME EXERCISE PROGRAM: Access Code: [email or web address] URL: [email or web address] Date: 01/30/2024 Prepared by: Orvil Arrow Tomko  Exercises - Standing Quadriceps Stretch  - 1 x daily - 7 x weekly - 1 sets - 2 reps - 20 hold - Modified Thomas Stretch  - 1 x daily - 7 x weekly - 1 sets - 2 reps - 20 hold - Standing Hamstring Stretch on Chair  - 1 x daily - 7 x weekly - 1 sets - 2 reps - 20 hold - Standing  Forward Trunk Flexion  - 1 x daily - 7 x weekly - 1 sets - 2 reps - 20 hold - Seated Figure 4 Piriformis Stretch  - 1 x daily - 7 x weekly - 1 sets - 2 reps - 20 hold - Supine Figure 4 Piriformis Stretch  - 1 x daily - 7 x weekly - 1 sets - 2 reps - 20 hold - Gastroc Stretch on Wall  - 1 x daily - 7 x weekly - 1 sets - 2 reps - 20 hold - Squat with Chair Touch  - 1 x daily - 7 x weekly - 2 sets - 10 reps - Kettlebell Deadlift  - 1 x daily - 7 x weekly - 2 sets - 10 reps - Sidelying Hip Abduction  - 1 x daily - 7 x weekly - 2 sets - 10 reps - Standing Repeated Hip Abduction with Ankle Weight  - 1 x daily - 7 x weekly - 2 sets - 10 reps - Standing Hip Extension with Ankle Weight  - 1 x daily - 7 x weekly - 2 sets - 10 reps - Standing High Row with Resistance  - 1 x daily - 7 x weekly - 2 sets - 10 reps - Shoulder Extension with Resistance - Palms Forward  - 1 x daily - 7 x weekly - 2 sets - 10 reps - Standing Tricep Extensions with Resistance  - 1 x daily - 7 x weekly - 2 sets - 10 reps - Supine Straight Leg Lumbar Rotation Stretch  - 1 x daily - 7 x  weekly - 1 sets - 3 reps - 30 hold - Sidelying Open Book Thoracic Lumbar Rotation and Extension  - 1 x daily - 7 x weekly - 1 sets - 3 reps - 30 hold - Seated Lumbar Flexion Stretch  - 1 x daily - 7 x weekly - 1 sets - 3 reps - 30 hold - Seated Sidebending Arms Overhead  - 1 x daily - 7 x weekly - 1 sets - 3 reps - 30 hold - Seated Trunk Rotation - Arms Crossed  - 1 x daily - 7 x weekly - 1 sets - 10 reps  ASSESSMENT:  CLINICAL IMPRESSION: Pt reports improved Rt hip pain, rating as a 3/10.  She continues to display weakness in closed chain stabilization of Rt>Lt hip and with open chain MMT of hip ER, abd and ext on Rt.  She is very tight and tender in bil ITB so we added stretch for this today.  Pt has had limited visits secondary to wanting to work consistently with treating PT only who is working a very limited schedule at this time.  Pt will  continue to benefit from skilled PT to address limitations in flexibility, weakness and functional balance related to Rt hip findings above.  OBJECTIVE IMPAIRMENTS: decreased balance, difficulty walking, decreased strength, increased muscle spasms, impaired flexibility, postural dysfunction, and pain.   ACTIVITY LIMITATIONS: lifting, bending, sitting, standing, squatting, sleeping, and stairs  PARTICIPATION LIMITATIONS: cleaning, laundry, community activity, and yard work  PERSONAL FACTORS: Time since onset of injury/illness/exacerbation and 3+ comorbidities: OA, Hx of right THA, Hx of breast cancer are also affecting patient's functional outcome.   REHAB POTENTIAL: Good  CLINICAL DECISION MAKING: Evolving/moderate complexity  EVALUATION COMPLEXITY: Moderate   GOALS: Goals reviewed with patient? Yes  SHORT TERM GOALS: Target date: 01/09/2024 Patient will be independent with initial HEP. Baseline: Goal status: MET 4/28  2.  Patient will report at least a 25% improvement in right hip pain. Baseline:  Goal status: met 4/22   LONG TERM GOALS: Target date: 01/09/2024  Patient will be independent with advanced HEP to allow for self progression after discharge. Baseline:  Goal status: ongoing  2.  Patient will improve Lower Extremity Functional Scale (LEFS) to at least 50% to demonstrate improvements in functional tasks. Baseline: 12.5% Goal status: met 4/28  3.  Patient will increase right hip strength to at least 4+ to 5-/5 to allow her to hike on various terrain and navigate stairs with improved ease. Baseline: 4/5 Goal status: ongoing 6/26  4.  Patient will report ability to return to hiking for at least 2 miles without increased pain. Baseline:  Goal status: ongoing 6/26 - hiking but with pain  5. Pt will be able to demo floor to stand transfer in case of a fall.  Baseline: no knowledge  Goal status: ongoing 6/26   PLAN:  PT FREQUENCY: 1-2x/week  PT DURATION: 8  weeks  PLANNED INTERVENTIONS: 97164- PT Re-evaluation, 97110-Therapeutic exercises, 97530- Therapeutic activity, 97112- Neuromuscular re-education, 97535- Self Care, [id number]- Manual therapy, [id number]- Gait training, [phone]- Canalith repositioning, [id number]- Aquatic Therapy, [phone]- Electrical stimulation (unattended), [phone]- Electrical stimulation (manual), [id number]- Vasopneumatic device, [id number]- Ultrasound, [id number]- Traction (mechanical), [id number]- Ionotophoresis 4mg /ml Dexamethasone, Patient/Family education, Balance training, Stair training, Taping, Dry Needling, Joint mobilization, Joint manipulation, Spinal manipulation, Spinal mobilization, Vestibular training, Cryotherapy, and Moist heat  PLAN FOR NEXT SESSION: targeted Rt hip ER, abd, and ext strength, functional closed chain balance/stabilization of hip, work on squat mechanics, ITB stretching, update  HEP   Orvil Fester, PT 03/04/24 5:10 PM      [facility] [address] [place], [place] [address] Phone # [phone] Fax [phone]

## 2024-03-08 ENCOUNTER — Other Ambulatory Visit (HOSPITAL_BASED_OUTPATIENT_CLINIC_OR_DEPARTMENT_OTHER): Payer: Self-pay | Admitting: Certified Nurse Midwife

## 2024-03-08 ENCOUNTER — Telehealth (HOSPITAL_BASED_OUTPATIENT_CLINIC_OR_DEPARTMENT_OTHER): Payer: Self-pay | Admitting: Certified Nurse Midwife

## 2024-03-08 DIAGNOSIS — Z758 Other problems related to medical facilities and other health care: Secondary | ICD-10-CM

## 2024-03-08 DIAGNOSIS — E039 Hypothyroidism, unspecified: Secondary | ICD-10-CM

## 2024-03-08 NOTE — Telephone Encounter (Signed)
 New message  The patient called asking for a Referral to a [facility] associate requesting Dr. Balan.

## 2024-03-08 NOTE — Telephone Encounter (Signed)
  CNM telephoned pt. TSH elevated. Pt would like referral to new Family Medicine Provider and would prefer [facility]. Pt prefers referral to Endocrinology due to elevated TSH and symptoms. Referrals placed for both.  Arland MARLA Roller

## 2024-03-08 NOTE — Telephone Encounter (Signed)
 TC to pt, DOB verified.  Referral faxed with records to Dr Tommas.  Sotero CMA

## 2024-03-09 DIAGNOSIS — I214 Non-ST elevation (NSTEMI) myocardial infarction: Secondary | ICD-10-CM | POA: Diagnosis not present

## 2024-03-09 DIAGNOSIS — I2510 Atherosclerotic heart disease of native coronary artery without angina pectoris: Secondary | ICD-10-CM | POA: Diagnosis not present

## 2024-03-09 DIAGNOSIS — I10 Essential (primary) hypertension: Secondary | ICD-10-CM | POA: Diagnosis not present

## 2024-03-09 DIAGNOSIS — E782 Mixed hyperlipidemia: Secondary | ICD-10-CM | POA: Diagnosis not present

## 2024-03-10 ENCOUNTER — Encounter: Payer: Self-pay | Admitting: Physical Therapy

## 2024-03-10 ENCOUNTER — Ambulatory Visit: Attending: Orthopedic Surgery | Admitting: Physical Therapy

## 2024-03-10 DIAGNOSIS — M25551 Pain in right hip: Secondary | ICD-10-CM | POA: Diagnosis not present

## 2024-03-10 DIAGNOSIS — R262 Difficulty in walking, not elsewhere classified: Secondary | ICD-10-CM | POA: Insufficient documentation

## 2024-03-10 DIAGNOSIS — M6281 Muscle weakness (generalized): Secondary | ICD-10-CM | POA: Insufficient documentation

## 2024-03-10 DIAGNOSIS — R252 Cramp and spasm: Secondary | ICD-10-CM | POA: Diagnosis not present

## 2024-03-10 NOTE — Therapy (Signed)
 OUTPATIENT PHYSICAL THERAPY LOWER EXTREMITY RE-EVALUATION    Patient Name: Julie Turner MRN: [id number] DOB:1955-12-16, 68 y.o., female Today's Date: 03/10/2024  END OF SESSION:  PT End of Session - 03/10/24 1535     Visit Number 10    Date for PT Re-Evaluation 04/29/24    Authorization Type [employer] Medicare Dual    Progress Note Due on Visit 19    PT Start Time 1534    PT Stop Time 1614    PT Time Calculation (min) 40 min    Activity Tolerance Patient tolerated treatment well    Behavior During Therapy WFL for tasks assessed/performed               Past Medical History:  Diagnosis Date   Anxiety    Arthritis    all my joints   Breast cancer, right breast (HCC) 2001   S/P lumpecbomy and radiation   Depression    Fibromyalgia    GERD (gastroesophageal reflux disease)    High cholesterol    Hypertension    just when I had radiation in 2001   Migraine    when I don't take my ASA qd (08/15/2014)   Numbness and tingling in left hand    Spinal headache    Stroke (HCC) ~ 2013   silent stroke per her neurologist   Past Surgical History:  Procedure Laterality Date   BREAST LUMPECTOMY Right 09/10/1999   BREAST LUMPECTOMY Right    2nd time w/all the lymph nodes under my arm   BREAST RECONSTRUCTION Bilateral    CESAREAN SECTION  1987; 1988   CHOLECYSTECTOMY  09/10/1999   CORONARY ARTERY BYPASS GRAFT  06/2022   DILATION AND CURETTAGE OF UTERUS  09/10/1999   FOOT SURGERY Left ~ 2012   joint cleaned out   JOINT REPLACEMENT     TOTAL HIP ARTHROPLASTY Right 08/15/2014   TOTAL HIP ARTHROPLASTY Right 08/15/2014   Procedure: RIGHT TOTAL HIP ARTHROPLASTY;  Surgeon: Dempsey JINNY Sensor, MD;  Location: [facility] OR;  Service: Orthopedics;  Laterality: Right;   Patient Active Problem List   Diagnosis Date Noted   Other fatigue 02/17/2024   Urinary frequency 02/17/2024   Brain fog 02/17/2024   Palpitations 12/01/2023   PAD (peripheral artery disease) (HCC) 12/17/2022   Coronary  artery disease of native artery of native heart with stable angina pectoris (HCC) 08/09/2022   Statin myopathy 08/09/2022   Mixed hyperlipidemia 08/09/2022   S/P CABG (coronary artery bypass graft) 08/09/2022   Primary hypertension 08/09/2022   Low back pain 11/22/2021   Leg cramps 01/18/2019   Decreased exercise tolerance 01/18/2019   Arthritis of right hip 08/15/2014    PCP: Dayna Motto, DO  REFERRING PROVIDER: Sensor Dempsey, MD  REFERRING DIAG: M70.61 (ICD-10-CM) - Trochanteric bursitis, right hip  THERAPY DIAG:  Pain in right hip  Difficulty in walking, not elsewhere classified  Muscle weakness (generalized)  Cramp and spasm  Rationale for Evaluation and Treatment: Rehabilitation  ONSET DATE: a couple of weeks, maybe 3  SUBJECTIVE:   SUBJECTIVE STATEMENT: I have a thyroid  issue so have been referred to address this.  It may explain some of the fog I have.    PERTINENT HISTORY: Carnivore Diet Right THA in 2015, Hx of breast cancer, Right great toe pain, OA  PAIN:  12/10/23 Are you having pain? Yes: NPRS scale: 2/10 Pain location: anterior Rt thigh Pain description: spasms - less frequent and intense Aggravating factors: feels it when she wakes up and in  bed at night Relieving factors: unknown  PRECAUTIONS: None  RED FLAGS: None   WEIGHT BEARING RESTRICTIONS: No  FALLS:  Has patient fallen in last 6 months? 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  PATIENT GOALS: To be pain free and be more active to hike and exercise.  NEXT MD VISIT: As needed  OBJECTIVE:  Note: Objective measures were completed at Evaluation unless otherwise noted.  DIAGNOSTIC FINDINGS:  Pt reports that they did radiographs at the ortho office and patient was diagnosed  with bursitis.  PATIENT SURVEYS:  03/04/24: LEFS 45/80 = 56.25% 01/05/24: LEFS 46/80 = 57.5% Eval: LEFS 10 / 80 = 12.5 %  COGNITION: Overall cognitive status: Within functional limits for tasks assessed     SENSATION: Patient reports some numbness and tingling down her right leg   MUSCLE LENGTH: 03/04/24: limited bil ITB, bil piriformis Eval: Hamstrings: tight at end range on Rt, bil quads tight Rt>Lt   POSTURE: rounded shoulders and forward head  PALPATION: Patient is tender to palpation along right hip  LOWER EXTREMITY ROM: 03/04/24:  Eval: WNL with exception of knee flexion secondary to bil quad tighness WFL with some pain on right hip and right great toe  LOWER EXTREMITY MMT: 03/04/24: Rt hip ER 4-/5 Rt hip abd 4/5 Rt hip ext 4/5 Lt hip 5/5  01/05/24: Grossly weak bil hips 4-/5, knees 4/5   Eval: Left LE is WFL Right hip strength is grossly 4/5   FUNCTIONAL TESTS:  01/05/24: 5xSTS: 7.29 sec TUG: 6.91 sec  Eval: 5 times sit to stand: 13.74 sec Timed up and go (TUG): 9.65 sec  GAIT: Distance walked: >500 ft Assistive device utilized: None Level of assistance: Complete Independence Comments: Patient reports antalgic gait                                                                                                                                TODAY'S TREATMENT  03/10/24 Bike L3 x6' PT present to discuss status Supine bridge with hip abd red loop x10x5 Supine ITB stretch with strap 30 bil Bird dog 5 rounds 5 holds  SL clam red loop band x10 each LE Sit to stand from mat with 10lb kbell OH press, red band around knees Sidestepping red loop at knees  5 laps x 5 steps LE circles around tall cone in SLS, double tap marching alt LE to tall cone Fwd and lateral lunge onto BOSU X10 each LE Standing T at counter holding 4lb x15 each Seated thoracic rotation with OP in armchair x20 each Open book adding blue tband Seated neck retraction into blue tband  3x10  03/04/24 ERO - LEFS, ROM, MMT, flexibility Supine PPT 5x5 Supine bridge with hip abd red loop x10x5 Supine ITB  stretch with strap 30 bil SL clam red loop band x10 each LE Bird dog 5 rounds 5 holds  Sidestepping x5 steps Lt/Rt red loop with 5 squats each lap x 5 laps VC and demo needed for squat technique  01/30/24 Recumbent bike L3 x 5' PT present to discuss status Seated HS stretch x30 each side Fwd step up 6 march tap 3rd step x12 each light rail use 2x10 Deadlifts 10lb x15 Seated 3-way UE raise 3lb dumbbells x10 rounds - sit tall for core cure Iron cross stretch on table 2x20 bil Open book x10 each way Attempted supine dead bug isometric, heel taps but Pt not connecting with core Quadruped TA indraw x8 reps Bird dog x10 - good exercise for Pt Standing cross punches and fwd punches 3lb alt UE x20 each Seated thoracic SB and Rot 2x20 Updated HEP for stretches  01/22/24: Recumbent bike L2 x 5' PT present to discuss status Bil HS stretch foot on 2nd or 3rd step x30 Bil hip flexor stretch with OH reach x30 Sit to stand hold 10lb x10 Standing T at counter 10lb x10 each LE Side lunge/back lunge on slider at counter x8 each bil Squat with diagonal chop/lift x10 each 8lb March hip matrix 40lb x15 each Hip matrix hip ext 55lb x15 each 3lb bil UE weights - cross punches, upper cuts, lateral raises, OH press Updated HEP for UE: black band rows high anchor, green band shoulder ext and tricep press   PATIENT EDUCATION:  Education details: [email or web address], Issued HEP and provided handouts for aquatic PT Person educated: Patient Education method: Explanation, Demonstration, and Handouts Education comprehension: verbalized understanding  HOME EXERCISE PROGRAM: Access Code: [email or web address] URL: [email or web address] Date: 01/30/2024 Prepared by: Orvil Marney Treloar  Exercises - Standing Quadriceps Stretch  - 1 x daily - 7 x weekly - 1 sets - 2 reps - 20 hold -  Modified Thomas Stretch  - 1 x daily - 7 x weekly - 1 sets - 2 reps - 20 hold - Standing Hamstring Stretch on Chair  - 1 x daily - 7 x weekly - 1 sets - 2 reps - 20 hold - Standing Forward Trunk Flexion  - 1 x daily - 7 x weekly - 1 sets - 2 reps - 20 hold - Seated Figure 4 Piriformis Stretch  - 1 x daily - 7 x weekly - 1 sets - 2 reps - 20 hold - Supine Figure 4 Piriformis Stretch  - 1 x daily - 7 x weekly - 1 sets - 2 reps - 20 hold - Gastroc Stretch on Wall  - 1 x daily - 7 x weekly - 1 sets - 2 reps - 20 hold - Squat with Chair Touch  - 1 x daily - 7 x weekly - 2 sets - 10 reps - Kettlebell Deadlift  - 1 x daily - 7 x weekly - 2 sets - 10 reps - Sidelying Hip Abduction  - 1 x daily - 7 x weekly - 2 sets - 10 reps - Standing Repeated Hip Abduction with Ankle Weight  - 1 x daily - 7 x weekly - 2 sets - 10 reps - Standing Hip Extension with Ankle Weight  - 1 x daily - 7 x weekly - 2 sets - 10 reps - Standing High Row with Resistance  - 1 x daily - 7 x weekly - 2 sets - 10 reps - Shoulder Extension with Resistance - Palms Forward  - 1 x daily - 7 x  weekly - 2 sets - 10 reps - Standing Tricep Extensions with Resistance  - 1 x daily - 7 x weekly - 2 sets - 10 reps - Supine Straight Leg Lumbar Rotation Stretch  - 1 x daily - 7 x weekly - 1 sets - 3 reps - 30 hold - Sidelying Open Book Thoracic Lumbar Rotation and Extension  - 1 x daily - 7 x weekly - 1 sets - 3 reps - 30 hold - Seated Lumbar Flexion Stretch  - 1 x daily - 7 x weekly - 1 sets - 3 reps - 30 hold - Seated Sidebending Arms Overhead  - 1 x daily - 7 x weekly - 1 sets - 3 reps - 30 hold - Seated Trunk Rotation - Arms Crossed  - 1 x daily - 7 x weekly - 1 sets - 10 reps  ASSESSMENT:  CLINICAL IMPRESSION: Pt reports reduced pain to 2/10 today.  She is challenged by loaded/resistance gluteal strength exercises, closed chain balance tasks and benefits from cues for transverse plane motion for strength and control for twisting tasks and  mobility.  Pt continues to benefit from PT and reported feeling even better as she was leaving than when she arrived.  OBJECTIVE IMPAIRMENTS: decreased balance, difficulty walking, decreased strength, increased muscle spasms, impaired flexibility, postural dysfunction, and pain.   ACTIVITY LIMITATIONS: lifting, bending, sitting, standing, squatting, sleeping, and stairs  PARTICIPATION LIMITATIONS: cleaning, laundry, community activity, and yard work  PERSONAL FACTORS: Time since onset of injury/illness/exacerbation and 3+ comorbidities: OA, Hx of right THA, Hx of breast cancer are also affecting patient's functional outcome.   REHAB POTENTIAL: Good  CLINICAL DECISION MAKING: Evolving/moderate complexity  EVALUATION COMPLEXITY: Moderate   GOALS: Goals reviewed with patient? Yes  SHORT TERM GOALS: Target date: 01/09/2024 Patient will be independent with initial HEP. Baseline: Goal status: MET 4/28  2.  Patient will report at least a 25% improvement in right hip pain. Baseline:  Goal status: met 4/22   LONG TERM GOALS: Target date: 01/09/2024  Patient will be independent with advanced HEP to allow for self progression after discharge. Baseline:  Goal status: ongoing  2.  Patient will improve Lower Extremity Functional Scale (LEFS) to at least 50% to demonstrate improvements in functional tasks. Baseline: 12.5% Goal status: met 4/28  3.  Patient will increase right hip strength to at least 4+ to 5-/5 to allow her to hike on various terrain and navigate stairs with improved ease. Baseline: 4/5 Goal status: ongoing 6/26  4.  Patient will report ability to return to hiking for at least 2 miles without increased pain. Baseline:  Goal status: ongoing 6/26 - hiking but with pain  5. Pt will be able to demo floor to stand transfer in case of a fall.  Baseline: no knowledge  Goal status: ongoing 6/26   PLAN:  PT FREQUENCY: 1-2x/week  PT DURATION: 8 weeks  PLANNED  INTERVENTIONS: 97164- PT Re-evaluation, 97110-Therapeutic exercises, 97530- Therapeutic activity, 97112- Neuromuscular re-education, 97535- Self Care, [id number]- Manual therapy, [id number]- Gait training, [phone]- Canalith repositioning, [id number]- Aquatic Therapy, [phone]- Electrical stimulation (unattended), [phone]- Electrical stimulation (manual), [id number]- Vasopneumatic device, [id number]- Ultrasound, [id number]- Traction (mechanical), [id number]- Ionotophoresis 4mg /ml Dexamethasone, Patient/Family education, Balance training, Stair training, Taping, Dry Needling, Joint mobilization, Joint manipulation, Spinal manipulation, Spinal mobilization, Vestibular training, Cryotherapy, and Moist heat  PLAN FOR NEXT SESSION: targeted Rt hip ER, abd, and ext strength, functional closed chain balance/stabilization of hip, work on squat mechanics, ITB stretching, update HEP  Orvil Fester, PT 03/10/24 4:14 PM       [facility] [address], [address] [place], [place] [address] Phone # [phone] Fax [phone]

## 2024-03-16 NOTE — Telephone Encounter (Signed)
 F/u    The patient asking for referral sent to [facility] in [facility] asking for first available.   Phone # [phone]  Fax # [phone]  Asking for call back when the referral is sent.

## 2024-03-16 NOTE — Telephone Encounter (Signed)
 F/u   Referral fax to [facility] at [phone].

## 2024-03-16 NOTE — Addendum Note (Signed)
 Addended byBETHA TAD RACE on: 03/16/2024 02:07 PM   Modules accepted: Orders

## 2024-03-17 ENCOUNTER — Encounter: Payer: Self-pay | Admitting: Physical Therapy

## 2024-03-17 ENCOUNTER — Ambulatory Visit: Admitting: Physical Therapy

## 2024-03-17 DIAGNOSIS — M6281 Muscle weakness (generalized): Secondary | ICD-10-CM

## 2024-03-17 DIAGNOSIS — R252 Cramp and spasm: Secondary | ICD-10-CM | POA: Diagnosis not present

## 2024-03-17 DIAGNOSIS — R262 Difficulty in walking, not elsewhere classified: Secondary | ICD-10-CM | POA: Diagnosis not present

## 2024-03-17 DIAGNOSIS — M25551 Pain in right hip: Secondary | ICD-10-CM

## 2024-03-17 NOTE — Telephone Encounter (Signed)
 F/u  The patient called several times today and on Tuesday, March 16, 2024, regarding a referral to [facility] to see Dr. Tommas.  The office fax number is [phone].phone number [phone] spoke with a lady name Winton, advise me to send office notes, lab results, a copy of the insurance card, and the reason for the referral was sent.  [facility] fax number: [phone]. Requesting office notes, lab results, and a copy of the insurance card was sent.   Tuesday, July 8,2025 The fax call report results - success

## 2024-03-17 NOTE — Therapy (Signed)
 OUTPATIENT PHYSICAL THERAPY LOWER EXTREMITY TREATMENT NOTE    Patient Name: Julie Turner MRN: [id number] DOB:[birth year], 68 y.o., female Today's Date: 03/17/2024  END OF SESSION:  PT End of Session - 03/17/24 1532     Visit Number 11    Date for PT Re-Evaluation 04/29/24    Authorization Type [employer] Medicare Dual    Progress Note Due on Visit 19    PT Start Time 1532    PT Stop Time 1615    PT Time Calculation (min) 43 min    Activity Tolerance Patient tolerated treatment well    Behavior During Therapy WFL for tasks assessed/performed                Past Medical History:  Diagnosis Date   Anxiety    Arthritis    all my joints   Breast cancer, right breast (HCC) 2001   S/P lumpecbomy and radiation   Depression    Fibromyalgia    GERD (gastroesophageal reflux disease)    High cholesterol    Hypertension    just when I had radiation in 2001   Migraine    when I don't take my ASA qd (08/15/2014)   Numbness and tingling in left hand    Spinal headache    Stroke (HCC) ~ 2013   silent stroke per her neurologist   Past Surgical History:  Procedure Laterality Date   BREAST LUMPECTOMY Right 09/10/1999   BREAST LUMPECTOMY Right    2nd time w/all the lymph nodes under my arm   BREAST RECONSTRUCTION Bilateral    CESAREAN SECTION  1987; 1988   CHOLECYSTECTOMY  09/10/1999   CORONARY ARTERY BYPASS GRAFT  06/2022   DILATION AND CURETTAGE OF UTERUS  09/10/1999   FOOT SURGERY Left ~ 2012   joint cleaned out   JOINT REPLACEMENT     TOTAL HIP ARTHROPLASTY Right 08/15/2014   TOTAL HIP ARTHROPLASTY Right 08/15/2014   Procedure: RIGHT TOTAL HIP ARTHROPLASTY;  Surgeon: Dempsey JINNY Sensor, MD;  Location: [facility] OR;  Service: Orthopedics;  Laterality: Right;   Patient Active Problem List   Diagnosis Date Noted   Other fatigue 02/17/2024   Urinary frequency 02/17/2024   Brain fog 02/17/2024   Palpitations 12/01/2023   PAD (peripheral artery disease) (HCC) 12/17/2022    Coronary artery disease of native artery of native heart with stable angina pectoris (HCC) 08/09/2022   Statin myopathy 08/09/2022   Mixed hyperlipidemia 08/09/2022   S/P CABG (coronary artery bypass graft) 08/09/2022   Primary hypertension 08/09/2022   Low back pain 11/22/2021   Leg cramps 01/18/2019   Decreased exercise tolerance 01/18/2019   Arthritis of right hip 08/15/2014    PCP: Dayna Motto, DO  REFERRING PROVIDER: Sensor Dempsey, MD  REFERRING DIAG: M70.61 (ICD-10-CM) - Trochanteric bursitis, right hip  THERAPY DIAG:  Pain in right hip  Difficulty in walking, not elsewhere classified  Muscle weakness (generalized)  Cramp and spasm  Rationale for Evaluation and Treatment: Rehabilitation  ONSET DATE: a couple of weeks, maybe 3  SUBJECTIVE:   SUBJECTIVE STATEMENT: I am feeling weak today.  I haven't been able to get an appt to follow up on my thyroid .   My Rt hip continues to feel weak.   I continue to manage muscle cramps with electrolytes.  PERTINENT HISTORY: Carnivore Diet Right THA in 2015, Hx of breast cancer, Right great toe pain, OA  PAIN:  12/10/23 Are you having pain? Yes: NPRS scale: 2/10 Pain location: anterior Rt thigh Pain  description: spasms - less frequent and intense Aggravating factors: feels it when she wakes up and in bed at night Relieving factors: unknown  PRECAUTIONS: None  RED FLAGS: None   WEIGHT BEARING RESTRICTIONS: No  FALLS:  Has patient fallen in last 6 months? 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  PATIENT GOALS: To be pain free and be more active to hike and exercise.  NEXT MD VISIT: As needed  OBJECTIVE:  Note: Objective measures were completed at Evaluation unless otherwise noted.  DIAGNOSTIC  FINDINGS:  Pt reports that they did radiographs at the ortho office and patient was diagnosed with bursitis.  PATIENT SURVEYS:  03/04/24: LEFS 45/80 = 56.25% 01/05/24: LEFS 46/80 = 57.5% Eval: LEFS 10 / 80 = 12.5 %  COGNITION: Overall cognitive status: Within functional limits for tasks assessed     SENSATION: Patient reports some numbness and tingling down her right leg   MUSCLE LENGTH: 03/04/24: limited bil ITB, bil piriformis Eval: Hamstrings: tight at end range on Rt, bil quads tight Rt>Lt   POSTURE: rounded shoulders and forward head  PALPATION: Patient is tender to palpation along right hip  LOWER EXTREMITY ROM: 03/04/24:  Eval: WNL with exception of knee flexion secondary to bil quad tighness WFL with some pain on right hip and right great toe  LOWER EXTREMITY MMT: 03/04/24: Rt hip ER 4-/5 Rt hip abd 4/5 Rt hip ext 4/5 Lt hip 5/5  01/05/24: Grossly weak bil hips 4-/5, knees 4/5   Eval: Left LE is WFL Right hip strength is grossly 4/5   FUNCTIONAL TESTS:  01/05/24: 5xSTS: 7.29 sec TUG: 6.91 sec  Eval: 5 times sit to stand: 13.74 sec Timed up and go (TUG): 9.65 sec  GAIT: Distance walked: >500 ft Assistive device utilized: None Level of assistance: Complete Independence Comments: Patient reports antalgic gait                                                                                                                                TODAY'S TREATMENT  03/17/24 Bike L3 x 7' PT present to discuss status Seated trunk twist with overpressure using chair arm 2x20 each way Squat to chair x 8 Squat to chair in stagger stance with Rt LE back x8 Monster walks and sidestepping with BLUE tied band (blue tied band for HEP) Supine bridge with blue tied band for hip abd x10 SL clam blue tied band x 10 each side Supine HS stretch with strap 3x30 bil Supine ITB stretch with strap 3x30 bil Supine piriformis stretch Rt x1' Updated HEP and emailed to Pt as she  prefers the app vs printouts   03/10/24 Bike L3 x6' PT present to discuss status Supine bridge with hip abd red loop x10x5 Supine  ITB stretch with strap 30 bil Bird dog 5 rounds 5 holds  SL clam red loop band x10 each LE Sit to stand from mat with 10lb kbell OH press, red band around knees Sidestepping red loop at knees  5 laps x 5 steps LE circles around tall cone in SLS, double tap marching alt LE to tall cone Fwd and lateral lunge onto BOSU X10 each LE Standing T at counter holding 4lb x15 each Seated thoracic rotation with OP in armchair x20 each Open book adding blue tband Seated neck retraction into blue tband 3x10  03/04/24 ERO - LEFS, ROM, MMT, flexibility Supine PPT 5x5 Supine bridge with hip abd red loop x10x5 Supine ITB stretch with strap 30 bil SL clam red loop band x10 each LE Bird dog 5 rounds 5 holds  Sidestepping x5 steps Lt/Rt red loop with 5 squats each lap x 5 laps VC and demo needed for squat technique  PATIENT EDUCATION:  Education details: [employer], Issued HEP and provided handouts for aquatic PT Person educated: Patient Education method: Explanation, Demonstration, and Handouts Education comprehension: verbalized understanding  HOME EXERCISE PROGRAM: Access Code: [email or web address] URL: [email or web address] Date: 03/17/2024 Prepared by: Orvil Nayelli Inglis  Exercises - Standing Quadriceps Stretch  - 1 x daily - 7 x weekly - 1 sets - 2 reps - 20 hold - Modified Thomas Stretch  - 1 x daily - 7 x weekly - 1 sets - 2 reps - 20 hold - Standing Hamstring Stretch on Chair  - 1 x daily - 7 x weekly - 1 sets - 2 reps - 20 hold - Standing Forward Trunk Flexion  - 1 x daily - 7 x weekly - 1 sets - 2 reps - 20 hold - Seated Figure 4 Piriformis Stretch  - 1 x daily - 7 x weekly - 1 sets - 2 reps - 20 hold - Supine Figure 4 Piriformis Stretch  - 1 x daily - 7 x weekly - 1 sets - 2 reps - 20 hold - Gastroc Stretch on Wall  - 1 x daily - 7 x weekly - 1  sets - 2 reps - 20 hold - Standing Repeated Hip Abduction with Ankle Weight  - 1 x daily - 7 x weekly - 2 sets - 10 reps - Standing Hip Extension with Ankle Weight  - 1 x daily - 7 x weekly - 2 sets - 10 reps - Standing High Row with Resistance  - 1 x daily - 7 x weekly - 2 sets - 10 reps - Shoulder Extension with Resistance - Palms Forward  - 1 x daily - 7 x weekly - 2 sets - 10 reps - Standing Tricep Extensions with Resistance  - 1 x daily - 7 x weekly - 2 sets - 10 reps - Supine Straight Leg Lumbar Rotation Stretch  - 1 x daily - 7 x weekly - 1 sets - 3 reps - 30 hold - Sidelying Open Book Thoracic Lumbar Rotation and Extension  - 1 x daily - 7 x weekly - 1 sets - 3 reps - 30 hold - Seated Lumbar Flexion Stretch  - 1 x daily - 7 x weekly - 1 sets - 3 reps - 30 hold - Seated Sidebending Arms Overhead  - 1 x daily - 7 x weekly - 1 sets - 3 reps - 30 hold - Seated Trunk Rotation - Arms Crossed  - 1 x daily - 7 x weekly - 1 sets - 10 reps -  Squat with Chair Touch  - 1 x daily - 7 x weekly - 3 sets - 10 reps - Staggered Stance Squat  - 1 x daily - 7 x weekly - 2 sets - 10 reps - Single Leg Balance with Clock Reach  - 1 x daily - 7 x weekly - 3 sets - 10 reps - Kettlebell Deadlift  - 1 x daily - 7 x weekly - 2 sets - 10 reps - Forward T with Counter Support  - 1 x daily - 7 x weekly - 2 sets - 10 reps - Forward Monster Walks  - 1 x daily - 7 x weekly - 5 sets - 5 reps - Backward Monster Walks  - 1 x daily - 7 x weekly - 5 sets - 5 reps - Side Stepping with Resistance at Feet  - 1 x daily - 7 x weekly - 5 sets - 5 reps - Bird Dog  - 1 x daily - 7 x weekly - 1 sets - 10 reps - 5 sec hold - Bridge with Hip Abduction and Resistance  - 1 x daily - 7 x weekly - 2 sets - 10 reps - Clamshell with Resistance  - 1 x daily - 7 x weekly - 2 sets - 10 reps - Sidelying Hip Abduction  - 1 x daily - 7 x weekly - 2 sets - 10 reps ASSESSMENT:  CLINICAL IMPRESSION: Pt reports improved Rt hip pain but ongoing  weakness and intermittent cramping of Rt hamstring and posterior hip.  PT updated HEP so we reviewed all strength and targeted stretches today with good tolerance.  Pt does note increased challenge with single leg therex targeting Lt LE than Rt.  She is tight through IT band and piriformis so we are focusing on these muscles for stretches.  Pt has updated bank of HEP and PT recommends 3x/week x 20' commitment for HEP.  Pt will return to clinic in about 3-4 weeks to follow back up on how things are going.  OBJECTIVE IMPAIRMENTS: decreased balance, difficulty walking, decreased strength, increased muscle spasms, impaired flexibility, postural dysfunction, and pain.   ACTIVITY LIMITATIONS: lifting, bending, sitting, standing, squatting, sleeping, and stairs  PARTICIPATION LIMITATIONS: cleaning, laundry, community activity, and yard work  PERSONAL FACTORS: Time since onset of injury/illness/exacerbation and 3+ comorbidities: OA, Hx of right THA, Hx of breast cancer are also affecting patient's functional outcome.   REHAB POTENTIAL: Good  CLINICAL DECISION MAKING: Evolving/moderate complexity  EVALUATION COMPLEXITY: Moderate   GOALS: Goals reviewed with patient? Yes  SHORT TERM GOALS: Target date: 01/09/2024 Patient will be independent with initial HEP. Baseline: Goal status: MET 4/28  2.  Patient will report at least a 25% improvement in right hip pain. Baseline:  Goal status: met 4/22   LONG TERM GOALS: Target date: 01/09/2024  Patient will be independent with advanced HEP to allow for self progression after discharge. Baseline:  Goal status: ongoing  2.  Patient will improve Lower Extremity Functional Scale (LEFS) to at least 50% to demonstrate improvements in functional tasks. Baseline: 12.5% Goal status: met 4/28  3.  Patient will increase right hip strength to at least 4+ to 5-/5 to allow her to hike on various terrain and navigate stairs with improved ease. Baseline: 4/5 Goal  status: ongoing 6/26  4.  Patient will report ability to return to hiking for at least 2 miles without increased pain. Baseline:  Goal status: ongoing 6/26 - hiking but with pain  5.  Pt will be able to demo floor to stand transfer in case of a fall.  Baseline: no knowledge  Goal status: ongoing 6/26   PLAN:  PT FREQUENCY: 1-2x/week  PT DURATION: 8 weeks  PLANNED INTERVENTIONS: 97164- PT Re-evaluation, 97110-Therapeutic exercises, 97530- Therapeutic activity, 97112- Neuromuscular re-education, 97535- Self Care, [id number]- Manual therapy, [phone]- Gait training, [phone]- Canalith repositioning, [id number]- Aquatic Therapy, [phone]- Electrical stimulation (unattended), [phone]- Electrical stimulation (manual), 97016- Vasopneumatic device, [id number]- Ultrasound, [id number]- Traction (mechanical), [id number]- Ionotophoresis 4mg /ml Dexamethasone, Patient/Family education, Balance training, Stair training, Taping, Dry Needling, Joint mobilization, Joint manipulation, Spinal manipulation, Spinal mobilization, Vestibular training, Cryotherapy, and Moist heat  PLAN FOR NEXT SESSION: targeted Rt hip ER, abd, and ext strength, functional closed chain balance/stabilization of hip, work on squat mechanics, ITB stretching, update HEP   Orvil Fester, PT 03/17/24 5:23 PM        [facility] [address], [address] [place], [place] [address] Phone # [phone] Fax [phone]

## 2024-03-18 ENCOUNTER — Other Ambulatory Visit (HOSPITAL_BASED_OUTPATIENT_CLINIC_OR_DEPARTMENT_OTHER): Payer: Self-pay | Admitting: *Deleted

## 2024-03-18 DIAGNOSIS — E039 Hypothyroidism, unspecified: Secondary | ICD-10-CM

## 2024-03-18 NOTE — Progress Notes (Signed)
 Placed external referral for patient to go another location.  [place] CMA

## 2024-03-19 DIAGNOSIS — E039 Hypothyroidism, unspecified: Secondary | ICD-10-CM | POA: Diagnosis not present

## 2024-03-19 DIAGNOSIS — I2510 Atherosclerotic heart disease of native coronary artery without angina pectoris: Secondary | ICD-10-CM | POA: Diagnosis not present

## 2024-03-21 DIAGNOSIS — L02412 Cutaneous abscess of left axilla: Secondary | ICD-10-CM | POA: Diagnosis not present

## 2024-03-31 DIAGNOSIS — E782 Mixed hyperlipidemia: Secondary | ICD-10-CM | POA: Diagnosis not present

## 2024-03-31 DIAGNOSIS — R252 Cramp and spasm: Secondary | ICD-10-CM | POA: Diagnosis not present

## 2024-03-31 DIAGNOSIS — I2510 Atherosclerotic heart disease of native coronary artery without angina pectoris: Secondary | ICD-10-CM | POA: Diagnosis not present

## 2024-03-31 DIAGNOSIS — I252 Old myocardial infarction: Secondary | ICD-10-CM | POA: Diagnosis not present

## 2024-03-31 DIAGNOSIS — I10 Essential (primary) hypertension: Secondary | ICD-10-CM | POA: Diagnosis not present

## 2024-03-31 DIAGNOSIS — R7301 Impaired fasting glucose: Secondary | ICD-10-CM | POA: Diagnosis not present

## 2024-04-16 ENCOUNTER — Ambulatory Visit: Admitting: Physical Therapy

## 2024-04-16 DIAGNOSIS — E039 Hypothyroidism, unspecified: Secondary | ICD-10-CM | POA: Diagnosis not present

## 2024-04-22 DIAGNOSIS — D3132 Benign neoplasm of left choroid: Secondary | ICD-10-CM | POA: Diagnosis not present

## 2024-04-22 DIAGNOSIS — D3131 Benign neoplasm of right choroid: Secondary | ICD-10-CM | POA: Diagnosis not present

## 2024-04-22 DIAGNOSIS — H1045 Other chronic allergic conjunctivitis: Secondary | ICD-10-CM | POA: Diagnosis not present

## 2024-04-22 DIAGNOSIS — H25813 Combined forms of age-related cataract, bilateral: Secondary | ICD-10-CM | POA: Diagnosis not present

## 2024-04-22 DIAGNOSIS — H2589 Other age-related cataract: Secondary | ICD-10-CM | POA: Diagnosis not present

## 2024-04-26 ENCOUNTER — Ambulatory Visit: Admitting: Physical Therapy

## 2024-04-29 DIAGNOSIS — Z1231 Encounter for screening mammogram for malignant neoplasm of breast: Secondary | ICD-10-CM | POA: Diagnosis not present

## 2024-05-04 DIAGNOSIS — R252 Cramp and spasm: Secondary | ICD-10-CM | POA: Diagnosis not present

## 2024-05-04 DIAGNOSIS — Z7689 Persons encountering health services in other specified circumstances: Secondary | ICD-10-CM | POA: Diagnosis not present

## 2024-05-04 DIAGNOSIS — E782 Mixed hyperlipidemia: Secondary | ICD-10-CM | POA: Diagnosis not present

## 2024-05-04 DIAGNOSIS — I10 Essential (primary) hypertension: Secondary | ICD-10-CM | POA: Diagnosis not present

## 2024-05-06 DIAGNOSIS — H2512 Age-related nuclear cataract, left eye: Secondary | ICD-10-CM | POA: Diagnosis not present

## 2024-05-07 ENCOUNTER — Encounter: Admitting: Physical Therapy

## 2024-05-24 DIAGNOSIS — M79604 Pain in right leg: Secondary | ICD-10-CM | POA: Diagnosis not present

## 2024-05-24 DIAGNOSIS — M79605 Pain in left leg: Secondary | ICD-10-CM | POA: Diagnosis not present

## 2024-05-24 DIAGNOSIS — I2510 Atherosclerotic heart disease of native coronary artery without angina pectoris: Secondary | ICD-10-CM | POA: Diagnosis not present

## 2024-05-24 DIAGNOSIS — I10 Essential (primary) hypertension: Secondary | ICD-10-CM | POA: Diagnosis not present

## 2024-06-01 ENCOUNTER — Other Ambulatory Visit: Payer: Self-pay | Admitting: Nurse Practitioner

## 2024-06-01 DIAGNOSIS — E039 Hypothyroidism, unspecified: Secondary | ICD-10-CM

## 2024-06-01 DIAGNOSIS — I25810 Atherosclerosis of coronary artery bypass graft(s) without angina pectoris: Secondary | ICD-10-CM | POA: Diagnosis not present

## 2024-06-01 DIAGNOSIS — R7309 Other abnormal glucose: Secondary | ICD-10-CM | POA: Diagnosis not present

## 2024-06-02 ENCOUNTER — Ambulatory Visit
Admission: RE | Admit: 2024-06-02 | Discharge: 2024-06-02 | Disposition: A | Discharge status: Home / Self Care | Source: Ambulatory Visit | Attending: Nurse Practitioner | Admitting: Nurse Practitioner

## 2024-06-02 DIAGNOSIS — R7309 Other abnormal glucose: Secondary | ICD-10-CM | POA: Diagnosis not present

## 2024-06-02 DIAGNOSIS — E039 Hypothyroidism, unspecified: Secondary | ICD-10-CM

## 2024-06-07 DIAGNOSIS — I10 Essential (primary) hypertension: Secondary | ICD-10-CM | POA: Diagnosis not present

## 2024-06-07 DIAGNOSIS — J321 Chronic frontal sinusitis: Secondary | ICD-10-CM | POA: Diagnosis not present

## 2024-06-14 DIAGNOSIS — M79605 Pain in left leg: Secondary | ICD-10-CM | POA: Diagnosis not present

## 2024-06-14 DIAGNOSIS — I2510 Atherosclerotic heart disease of native coronary artery without angina pectoris: Secondary | ICD-10-CM | POA: Diagnosis not present

## 2024-06-14 DIAGNOSIS — M79604 Pain in right leg: Secondary | ICD-10-CM | POA: Diagnosis not present

## 2024-07-20 ENCOUNTER — Telehealth (HOSPITAL_BASED_OUTPATIENT_CLINIC_OR_DEPARTMENT_OTHER): Payer: Self-pay

## 2024-07-20 NOTE — Telephone Encounter (Signed)
 Patient would like for you to put the order back in for her to have an ultrasound.

## 2024-07-23 ENCOUNTER — Other Ambulatory Visit (HOSPITAL_BASED_OUTPATIENT_CLINIC_OR_DEPARTMENT_OTHER): Payer: Self-pay | Admitting: Certified Nurse Midwife

## 2024-07-23 DIAGNOSIS — R102 Pelvic and perineal pain unspecified side: Secondary | ICD-10-CM

## 2024-07-30 ENCOUNTER — Ambulatory Visit (HOSPITAL_BASED_OUTPATIENT_CLINIC_OR_DEPARTMENT_OTHER)
Admission: RE | Admit: 2024-07-30 | Discharge: 2024-07-30 | Disposition: A | Discharge status: Home / Self Care | Source: Ambulatory Visit | Attending: Certified Nurse Midwife | Admitting: Certified Nurse Midwife

## 2024-07-30 DIAGNOSIS — R102 Pelvic and perineal pain unspecified side: Secondary | ICD-10-CM | POA: Diagnosis present

## 2024-08-04 ENCOUNTER — Ambulatory Visit (HOSPITAL_BASED_OUTPATIENT_CLINIC_OR_DEPARTMENT_OTHER): Payer: Self-pay | Admitting: Certified Nurse Midwife

## 2024-08-19 ENCOUNTER — Telehealth: Payer: Self-pay | Admitting: Cardiology

## 2024-08-19 NOTE — Telephone Encounter (Signed)
 Called patient for recall. Pt stated she is under care of a different cardiologist now.
